# Patient Record
Sex: Female | Born: 1965 | Race: Black or African American | Hispanic: No | Marital: Single | Smoking: Never smoker
Health system: Southern US, Community
[De-identification: ages and names within clinical notes are randomized; demographics above are authoritative.]

## PROBLEM LIST (undated history)

## (undated) DIAGNOSIS — K56609 Unspecified intestinal obstruction, unspecified as to partial versus complete obstruction: Secondary | ICD-10-CM

## (undated) DIAGNOSIS — A419 Sepsis, unspecified organism: Secondary | ICD-10-CM

## (undated) DIAGNOSIS — I10 Essential (primary) hypertension: Secondary | ICD-10-CM

## (undated) DIAGNOSIS — K650 Generalized (acute) peritonitis: Secondary | ICD-10-CM

## (undated) DIAGNOSIS — Z8719 Personal history of other diseases of the digestive system: Secondary | ICD-10-CM

## (undated) DIAGNOSIS — R6521 Severe sepsis with septic shock: Secondary | ICD-10-CM

## (undated) DIAGNOSIS — N189 Chronic kidney disease, unspecified: Secondary | ICD-10-CM

## (undated) DIAGNOSIS — E119 Type 2 diabetes mellitus without complications: Secondary | ICD-10-CM

## (undated) DIAGNOSIS — R6520 Severe sepsis without septic shock: Secondary | ICD-10-CM

## (undated) DIAGNOSIS — J9601 Acute respiratory failure with hypoxia: Secondary | ICD-10-CM

## (undated) HISTORY — PX: ABDOMINAL HYSTERECTOMY: SHX81

## (undated) HISTORY — PX: APPENDECTOMY: SHX54

## (undated) HISTORY — DX: Personal history of other diseases of the digestive system: Z87.19

## (undated) HISTORY — DX: Severe sepsis without septic shock: R65.20

## (undated) HISTORY — DX: Chronic kidney disease, unspecified: N18.9

## (undated) HISTORY — DX: Severe sepsis with septic shock: R65.21

## (undated) HISTORY — DX: Unspecified intestinal obstruction, unspecified as to partial versus complete obstruction: K56.609

## (undated) HISTORY — DX: Sepsis, unspecified organism: A41.9

## (undated) HISTORY — PX: CHOLECYSTECTOMY: SHX55

## (undated) HISTORY — DX: Acute respiratory failure with hypoxia: J96.01

## (undated) HISTORY — DX: Generalized (acute) peritonitis: K65.0

---

## 1999-06-25 ENCOUNTER — Emergency Department (HOSPITAL_COMMUNITY): Admission: EM | Admit: 1999-06-25 | Discharge: 1999-06-25 | Payer: Self-pay | Admitting: *Deleted

## 2000-07-22 ENCOUNTER — Encounter: Admission: RE | Admit: 2000-07-22 | Discharge: 2000-10-20 | Payer: Self-pay | Admitting: Family Medicine

## 2001-01-07 ENCOUNTER — Encounter: Admission: RE | Admit: 2001-01-07 | Discharge: 2001-04-07 | Payer: Self-pay | Admitting: Family Medicine

## 2004-08-16 ENCOUNTER — Ambulatory Visit: Payer: Self-pay | Admitting: Family Medicine

## 2004-09-06 ENCOUNTER — Ambulatory Visit: Payer: Self-pay | Admitting: Family Medicine

## 2005-07-12 ENCOUNTER — Ambulatory Visit: Payer: Self-pay | Admitting: Family Medicine

## 2005-10-04 ENCOUNTER — Emergency Department (HOSPITAL_COMMUNITY): Admission: EM | Admit: 2005-10-04 | Discharge: 2005-10-04 | Payer: Self-pay | Admitting: Family Medicine

## 2005-10-08 ENCOUNTER — Ambulatory Visit: Payer: Self-pay | Admitting: Sports Medicine

## 2005-10-22 ENCOUNTER — Ambulatory Visit: Payer: Self-pay | Admitting: Sports Medicine

## 2006-05-13 ENCOUNTER — Ambulatory Visit (HOSPITAL_COMMUNITY): Admission: RE | Admit: 2006-05-13 | Discharge: 2006-05-13 | Payer: Self-pay | Admitting: Family Medicine

## 2006-05-13 ENCOUNTER — Ambulatory Visit: Payer: Self-pay | Admitting: Sports Medicine

## 2006-05-20 ENCOUNTER — Ambulatory Visit: Payer: Self-pay | Admitting: Family Medicine

## 2006-06-14 ENCOUNTER — Ambulatory Visit: Payer: Self-pay

## 2006-06-27 ENCOUNTER — Encounter: Admission: RE | Admit: 2006-06-27 | Discharge: 2006-06-27 | Payer: Self-pay | Admitting: Sports Medicine

## 2007-01-09 DIAGNOSIS — Z6841 Body Mass Index (BMI) 40.0 and over, adult: Secondary | ICD-10-CM

## 2007-01-09 DIAGNOSIS — I10 Essential (primary) hypertension: Secondary | ICD-10-CM

## 2007-01-09 DIAGNOSIS — J309 Allergic rhinitis, unspecified: Secondary | ICD-10-CM

## 2007-02-26 ENCOUNTER — Ambulatory Visit: Payer: Self-pay | Admitting: Family Medicine

## 2007-02-26 ENCOUNTER — Encounter: Payer: Self-pay | Admitting: Family Medicine

## 2007-02-26 LAB — CONVERTED CEMR LAB
BUN: 17 mg/dL (ref 6–23)
CO2: 24 meq/L (ref 19–32)
Calcium: 10.5 mg/dL (ref 8.4–10.5)
Chloride: 103 meq/L (ref 96–112)
Creatinine, Ser: 1.15 mg/dL (ref 0.40–1.20)
Glucose, Bld: 99 mg/dL (ref 70–99)
Potassium: 4 meq/L (ref 3.5–5.3)
Sodium: 139 meq/L (ref 135–145)

## 2007-07-15 ENCOUNTER — Telehealth: Payer: Self-pay | Admitting: *Deleted

## 2007-09-30 ENCOUNTER — Telehealth (INDEPENDENT_AMBULATORY_CARE_PROVIDER_SITE_OTHER): Payer: Self-pay | Admitting: Family Medicine

## 2007-10-03 ENCOUNTER — Ambulatory Visit: Payer: Self-pay | Admitting: Family Medicine

## 2007-10-03 DIAGNOSIS — L2089 Other atopic dermatitis: Secondary | ICD-10-CM

## 2007-10-13 ENCOUNTER — Telehealth: Payer: Self-pay | Admitting: *Deleted

## 2007-11-19 ENCOUNTER — Ambulatory Visit: Payer: Self-pay | Admitting: Family Medicine

## 2007-11-24 ENCOUNTER — Ambulatory Visit: Payer: Self-pay | Admitting: Sports Medicine

## 2007-11-24 ENCOUNTER — Encounter (INDEPENDENT_AMBULATORY_CARE_PROVIDER_SITE_OTHER): Payer: Self-pay | Admitting: Family Medicine

## 2007-11-24 LAB — CONVERTED CEMR LAB
Alkaline Phosphatase: 65 units/L (ref 39–117)
BUN: 18 mg/dL (ref 6–23)
CO2: 23 meq/L (ref 19–32)
Creatinine, Ser: 1.11 mg/dL (ref 0.40–1.20)
Glucose, Bld: 112 mg/dL — ABNORMAL HIGH (ref 70–99)
Sodium: 140 meq/L (ref 135–145)
Total Bilirubin: 0.2 mg/dL — ABNORMAL LOW (ref 0.3–1.2)
Total Protein: 7.6 g/dL (ref 6.0–8.3)

## 2007-12-22 ENCOUNTER — Ambulatory Visit: Payer: Self-pay | Admitting: Family Medicine

## 2008-02-06 ENCOUNTER — Encounter (INDEPENDENT_AMBULATORY_CARE_PROVIDER_SITE_OTHER): Payer: Self-pay | Admitting: Family Medicine

## 2008-02-06 ENCOUNTER — Ambulatory Visit: Payer: Self-pay | Admitting: Family Medicine

## 2008-02-06 LAB — CONVERTED CEMR LAB
Cholesterol: 199 mg/dL (ref 0–200)
HDL: 51 mg/dL (ref >39)
LDL Cholesterol: 130 mg/dL — ABNORMAL HIGH (ref 0–99)
Total CHOL/HDL Ratio: 3.9
Triglycerides: 90 mg/dL (ref <150)
VLDL: 18 mg/dL (ref 0–40)

## 2008-03-15 ENCOUNTER — Telehealth: Payer: Self-pay | Admitting: *Deleted

## 2008-07-30 ENCOUNTER — Emergency Department (HOSPITAL_COMMUNITY): Admission: EM | Admit: 2008-07-30 | Discharge: 2008-07-30 | Payer: Self-pay | Admitting: Family Medicine

## 2008-10-29 ENCOUNTER — Ambulatory Visit: Payer: Self-pay | Admitting: Family Medicine

## 2008-10-29 ENCOUNTER — Encounter (INDEPENDENT_AMBULATORY_CARE_PROVIDER_SITE_OTHER): Payer: Self-pay | Admitting: Family Medicine

## 2008-10-29 DIAGNOSIS — E1169 Type 2 diabetes mellitus with other specified complication: Secondary | ICD-10-CM

## 2008-10-29 DIAGNOSIS — E782 Mixed hyperlipidemia: Secondary | ICD-10-CM

## 2008-10-29 LAB — CONVERTED CEMR LAB
BUN: 19 mg/dL (ref 6–23)
Calcium: 9.9 mg/dL (ref 8.4–10.5)
Chloride: 106 meq/L (ref 96–112)
Creatinine, Ser: 0.99 mg/dL (ref 0.40–1.20)

## 2008-11-30 ENCOUNTER — Encounter (INDEPENDENT_AMBULATORY_CARE_PROVIDER_SITE_OTHER): Payer: Self-pay | Admitting: Family Medicine

## 2008-11-30 ENCOUNTER — Telehealth (INDEPENDENT_AMBULATORY_CARE_PROVIDER_SITE_OTHER): Payer: Self-pay | Admitting: Family Medicine

## 2009-01-17 ENCOUNTER — Telehealth: Payer: Self-pay | Admitting: *Deleted

## 2009-03-01 ENCOUNTER — Encounter (INDEPENDENT_AMBULATORY_CARE_PROVIDER_SITE_OTHER): Payer: Self-pay | Admitting: Family Medicine

## 2009-03-01 ENCOUNTER — Ambulatory Visit: Payer: Self-pay | Admitting: Family Medicine

## 2009-03-02 ENCOUNTER — Encounter (INDEPENDENT_AMBULATORY_CARE_PROVIDER_SITE_OTHER): Payer: Self-pay | Admitting: Family Medicine

## 2009-03-02 LAB — CONVERTED CEMR LAB
ALT: 13 units/L (ref 0–35)
AST: 15 units/L (ref 0–37)
Albumin: 4.2 g/dL (ref 3.5–5.2)
Alkaline Phosphatase: 65 units/L (ref 39–117)
BUN: 13 mg/dL (ref 6–23)
Calcium: 10.5 mg/dL (ref 8.4–10.5)
Chloride: 102 meq/L (ref 96–112)
HDL: 56 mg/dL (ref >39)
LDL Cholesterol: 131 mg/dL — ABNORMAL HIGH (ref 0–99)
Potassium: 3.9 meq/L (ref 3.5–5.3)
Total CHOL/HDL Ratio: 3.7

## 2009-10-10 ENCOUNTER — Emergency Department (HOSPITAL_COMMUNITY): Admission: EM | Admit: 2009-10-10 | Discharge: 2009-10-10 | Payer: Self-pay | Admitting: Family Medicine

## 2009-11-10 ENCOUNTER — Encounter: Payer: Self-pay | Admitting: Family Medicine

## 2009-11-10 ENCOUNTER — Ambulatory Visit: Payer: Self-pay | Admitting: Family Medicine

## 2009-11-25 ENCOUNTER — Telehealth: Payer: Self-pay | Admitting: Family Medicine

## 2010-03-24 ENCOUNTER — Ambulatory Visit: Payer: Self-pay | Admitting: Family Medicine

## 2010-03-24 ENCOUNTER — Encounter: Payer: Self-pay | Admitting: Family Medicine

## 2010-03-24 LAB — CONVERTED CEMR LAB
ALT: 10 units/L (ref 0–35)
Albumin: 4.4 g/dL (ref 3.5–5.2)
Alkaline Phosphatase: 65 units/L (ref 39–117)
CO2: 23 meq/L (ref 19–32)
LDL Cholesterol: 134 mg/dL — ABNORMAL HIGH (ref 0–99)
Potassium: 4.1 meq/L (ref 3.5–5.3)
Sodium: 139 meq/L (ref 135–145)
Total Bilirubin: 0.3 mg/dL (ref 0.3–1.2)
Total Protein: 7.9 g/dL (ref 6.0–8.3)
VLDL: 15 mg/dL (ref 0–40)

## 2010-04-06 ENCOUNTER — Ambulatory Visit: Payer: Self-pay | Admitting: Family Medicine

## 2010-04-06 DIAGNOSIS — E1165 Type 2 diabetes mellitus with hyperglycemia: Secondary | ICD-10-CM | POA: Insufficient documentation

## 2010-04-11 ENCOUNTER — Ambulatory Visit: Payer: Self-pay | Admitting: Family Medicine

## 2010-04-14 ENCOUNTER — Ambulatory Visit: Payer: Self-pay | Admitting: Family Medicine

## 2010-04-17 ENCOUNTER — Telehealth (INDEPENDENT_AMBULATORY_CARE_PROVIDER_SITE_OTHER): Payer: Self-pay | Admitting: *Deleted

## 2010-08-29 ENCOUNTER — Telehealth (INDEPENDENT_AMBULATORY_CARE_PROVIDER_SITE_OTHER): Payer: Self-pay | Admitting: *Deleted

## 2010-09-11 ENCOUNTER — Telehealth: Payer: Self-pay | Admitting: Family Medicine

## 2010-09-26 ENCOUNTER — Ambulatory Visit: Payer: Self-pay | Admitting: Family Medicine

## 2010-09-26 LAB — CONVERTED CEMR LAB: Hgb A1c MFr Bld: 7.7 %

## 2010-12-14 NOTE — Progress Notes (Signed)
Summary: x-ray change  Phone Note Call from Patient Call back at Home Phone [phone]   Caller: Patient Summary of Call: Pt's schedule has changed and wondering if she can go for x-ray today. Initial call taken by: Clydell Hakim,  April 17, 2010 9:24 AM  Follow-up for Phone Call        patient was wanting to [facility] instead of tomorrow. gave her the number to call to try to reschedule.  Follow-up by: Theresia Lo RN,  April 17, 2010 9:41 AM

## 2010-12-14 NOTE — Progress Notes (Signed)
Summary: Rx Req  Phone Note Refill Request Call back at Home Phone [phone] Message from:  Patient  Refills Requested: Medication #1:  MAXZIDE-25 37.5-25 MG TABS Take 1 tablet by mouth once a day [facility] [address]  Initial call taken by: Clydell Hakim,  August 29, 2010 12:20 PM  Follow-up for Phone Call        advised patient that Rx was sent 08/25/2010. she states pharmacy does not have. will call pharmacy . advised patient that she will need to schedule appointment with new PCP soon. she is unable to schedule now and will call back.  called pharmacy and they did not receive RX, verbally gave refill to pharmacist that was sent 08/25/2010 Follow-up by: Theresia Lo RN,  August 29, 2010 3:17 PM

## 2010-12-14 NOTE — Progress Notes (Signed)
Summary: refill  Phone Note Refill Request Call back at Home Phone [phone] Message from:  Patient  Refills Requested: Medication #1:  MAXZIDE-25 37.5-25 MG TABS Take 1 tablet by mouth once a day  Medication #2:  LOTREL 5-40 MG  CAPS 1 tablet by mouth daily   Notes: will run out by end of month Initial call taken by: De Nurse,  November 25, 2009 8:54 AM  Follow-up for Phone Call        to pcp Follow-up by: Golden Circle RN,  November 25, 2009 8:58 AM  Additional Follow-up for Phone Call Additional follow up Details #1::        sent Additional Follow-up by: Lequita Asal  MD,  November 25, 2009 10:30 AM    Additional Follow-up for Phone Call Additional follow up Details #2::    pt notified Follow-up by: Golden Circle RN,  November 25, 2009 10:40 AM  Prescriptions: MAXZIDE-25 37.5-25 MG TABS (TRIAMTERENE-HCTZ) Take 1 tablet by mouth once a day  #30 x 6   Entered and Authorized by:   Lequita Asal  MD   Signed by:   Lequita Asal  MD on 11/25/2009   Method used:   Electronically to        [facility] [address] [phone]* (retail)       [address]       [place], [place]  [address]       Ph: [phone] or [phone]       Fax: [phone]   RxID:   [id number] LOTREL 5-40 MG  CAPS (AMLODIPINE BESY-BENAZEPRIL HCL) 1 tablet by mouth daily  #30 Capsule x 6   Entered and Authorized by:   Lequita Asal  MD   Signed by:   Lequita Asal  MD on 11/25/2009   Method used:   Electronically to        [facility]  [address] [phone]* (retail)       [address]       [place], [place]  [id number]       Ph: [phone] or [phone]       Fax: [phone]   RxID:   [id number]

## 2010-12-14 NOTE — Progress Notes (Signed)
Summary: question  Hey, I would be happy to refill these meds, but when I try to refill them, there is a note that says pt needs an office visit before refilling the Amlodipine and Maxzide.  Can she come in before 11/15 as a work in?  Phone Note Refill Request   Refills Requested: Medication #1:  MAXZIDE-25 37.5-25 MG TABS Take 1 tablet by mouth once a day Erin Schultz need refills on her Amlodipine and Maxzide .  She has an appt on 11/15.  Pt completely out of meds.  Initial call taken by: Abundio Miu,  September 11, 2010 9:14 AM     Appended Document: question spoke with pt and she informed me that she has made an appt to be seen however, she wanted to know if she can have a few pills until her appt?

## 2010-12-14 NOTE — Assessment & Plan Note (Signed)
Summary: f/u bp meds/bmc   Vital Signs:  Patient profile:   45 year old female Height:      66 inches Weight:      352 pounds BMI:     57.02 BSA:     2.55 Temp:     98.2 degrees F Pulse rate:   78 / minute BP sitting:   127 / 85  Vitals Entered By: Jone Baseman CMA (September 26, 2010 3:32 PM) CC: f/u BP meds Is Patient Diabetic? No Pain Assessment Patient in pain? no        Primary Provider:  Barnabas Lister  MD  CC:  f/u BP meds.  History of Present Illness: 45 yo AAF presents to clinic to meet new MD and refill BP meds.  BP 127/85 today.  Taking Triam/HCTZ and Amlodipione/Benzapril.  Pt has no adverse effects from meds.  Urinates about 3-4x day, but does not wake up at night to urinate.  States she is working hard on eating healthy, low-sodium foods and walking 15 min/day.    Pt was dx with T2DM and given Rx for Metormin.  Pt does not believe she is diabetic, and b/c her last A1C was 7.4, she wanted to modify her diet before taking medication.  Still seems hesitant to take DM meds.   ROS: Denies CP, SOB, HA, N/V.  Denies blurry vision, numbness/tingling in extremities.  Preventive Screening-Counseling & Management  Alcohol-Tobacco     Smoking Status: never     Tobacco Counseling: not indicated; no tobacco use  Current Medications (verified): 1)  Lotrel 5-40 Mg  Caps (Amlodipine Besy-Benazepril Hcl) .Marland Kitchen.. 1 Tablet By Mouth Daily 2)  Maxzide-25 37.5-25 Mg Tabs (Triamterene-Hctz) .... Take 1 Tablet By Mouth Once A Day 3)  Allegra 180 Mg Tabs (Fexofenadine Hcl) .... Take 1 Tablet By Mouth Once A Day 4)  Flonase 50 Mcg/act Susp (Fluticasone Propionate) .... Spray 2 Spray Into Both Nostrils Once A Day 5)  Zyrtec Allergy 10 Mg Tabs (Cetirizine Hcl) .Marland Kitchen.. 1 Tablet By Mouth At Bedtime For Allergies - Hold Until Patient Desires 6)  Glucophage 500 Mg Tabs (Metformin Hcl) .... One Tablet By Mouth At Bedtime X1 Week, Then Increase To One Tablet By Mouth Two Times A Day For  Diabetes  Allergies (verified): 1)  ! Sulfa  Family History: Reviewed history from 04/06/2010 and no changes required. Father diabetes and hypertension., Mother border line diabetes. brother diabetes  Social History: Reviewed history from 11/10/2009 and no changes required. Single, lives alone, close contact with family. 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 Denies tobacco, alcohol or other drugs.  Review of Systems       per HPI  Physical Exam  General:  alert and cooperative to examination.   Eyes:  vision grossly intact, pupils equal, pupils round, pupils reactive to light, and pupils react to accomodation.   Lungs:  Normal respiratory effort, chest expands symmetrically. Lungs are clear to auscultation, no crackles or wheezes. Heart:  Normal rate and regular rhythm. S1 and S2 normal without gallop, murmur, click, rub or other extra sounds. Extremities:  No clubbing, cyanosis, edema, or deformity noted with normal full range of motion of all joints.   Neurologic:  No cranial nerve deficits noted. Station and gait are normal.  Sensory, motor and coordinative functions appear intact.   Impression & Recommendations:  Problem # 1:  HYPERTENSION, BENIGN SYSTEMIC (ICD-401.1) Assessment Unchanged BP well-controlled today.  At last visit, BP was 115/82.  Will continue BP meds.  Will continue to monitor.  Her updated medication list for this problem includes:    Lotrel 5-40 Mg Caps (Amlodipine besy-benazepril hcl) .Marland Kitchen... 1 tablet by mouth daily    Maxzide-25 37.5-25 Mg Tabs (Triamterene-hctz) .Marland Kitchen... Take 1 tablet by mouth once a day  Orders: [facility]- Est Level  3 (16109)  Problem # 2:  DIABETES MELLITUS, TYPE II (ICD-250.00) Assessment: Deteriorated A1C today was 7.7.  Discussed pt's reluctance to start Metformin.  Advised pt that Metformin + diet/exercise could result in 6% weight reduction.  Will refill Metformin if pt decides to start taking it.  At next  visit, will consider referral to Dr. Gerilyn Pilgrim for weight management and nutrition counseling.  Would like to see pt in 2-3 months for f/u DM.  Her updated medication list for this problem includes:    Lotrel 5-40 Mg Caps (Amlodipine besy-benazepril hcl) .Marland Kitchen... 1 tablet by mouth daily    Glucophage 500 Mg Tabs (Metformin hcl) ..... One tablet by mouth at bedtime x1 week, then increase to one tablet by mouth two times a day for diabetes  Orders: A1C-[facility] (60454) [facility]- Est Level  3 (09811)  Patient Instructions: 1)  It was great to meet you today. 2)  I will [facility] your ref[facility], please continue to take medications as directed. 3)  Continue your healthy eating habits and walking exercises.  Keep up the good work! 4)  Thanks for your time. Prescriptions: GLUCOPHAGE 500 MG TABS (METFORMIN HCL) one tablet by mouth at bedtime x1 week, then increase to one tablet by mouth two times a day for DIABETES  #60 x 4   Entered and Authorized by:   Ivy de Lawson Radar  MD   Signed by:   Barnabas Lister  MD on 09/26/2010   Method used:   Electronically to        [facility]  [address] [phone]* (retail)       [address]       [place], [place]  [address]       Ph: [phone] or [phone]       Fax: [phone]   RxID:   [id number] LOTREL 5-40 MG  CAPS (AMLODIPINE BESY-BENAZEPRIL HCL) 1 tablet by mouth daily  #30 x 5   Entered and Authorized by:   Ivy de Lawson Radar  MD   Signed by:   Barnabas Lister  MD on 09/26/2010   Method used:   Electronically to        [facility] [phone]* (retail)       [address]       [place], [place]  [id number]       Ph: [phone] or [phone]       Fax: [phone]   RxID:   [id number] MAXZIDE-25 37.5-25 MG TABS (TRIAMTERENE-HCTZ) Take 1 tablet by mouth once a day  #30 x 5   Entered and Authorized by:   Ivy de Lawson Radar  MD   Signed by:   Barnabas Lister  MD on 09/26/2010   Method used:    Electronically to        [facility]  [address] [phone]* (retail)       [address]       [place], [place]  [address]       Ph: [phone] or [phone]       Fax: [phone]   RxID:   [id number]    Orders Added: 1)  A1C-[facility] [83036] 2)  [facility]- Est Level  3 [36644]    Laboratory Results   Blood Tests   Date/Time Received: September 26, 2010 3:53 PM  Date/Time Reported: September 26, 2010 4:05 PM  HGBA1C: 7.7%   (Normal Range: Non-Diabetic - 3-6%   Control Diabetic - 6-8%)  Comments: ...............test performed by......Marland KitchenBonnie A. [place], MLS (ASCP)cm

## 2010-12-14 NOTE — Assessment & Plan Note (Signed)
Summary: tb test/eo  Nurse Visit   Allergies: 1)  ! Sulfa  Immunizations Administered:  PPD Skin Test:    Vaccine Type: PPD    Site: left forearm    Mfr: Sanofi Pasteur    Dose: 0.1 ml    Route: ID    Given by: Dennison Nancy RN    Exp. Date: 08/25/2011    Lot #: [id number]  Orders Added: 1)  TB Skin Test [86580] 2)  Admin 1st Vaccine [90471] 3)  Est Level 1- [facility] [16109]

## 2010-12-14 NOTE — Assessment & Plan Note (Signed)
Summary: SUMMARY   Vital Signs:  Patient profile:   45 year old female Weight:      350.4 pounds BMI:     56.76 Temp:     98 degrees F oral Pulse rate:   147 / minute Pulse rhythm:   regular BP sitting:   115 / 82  (left arm) Cuff size:   large  Vitals Entered By: Loralee Pacas CMA (Apr 06, 2010 9:56 AM)  Nutrition Counseling: Patient's BMI is greater than 25 and therefore counseled on weight management options.  Serial Vital Signs/Assessments:  Time      Position  BP       Pulse  Resp  Temp     By 3:27 PM                      95                    Lequita Asal  MD  CC: follow-up visit Is Patient Diabetic? No   Primary Care Provider:  Lequita Asal  MD  CC:  follow-up visit.  History of Present Illness:   1.  HTN - Tolerating Medications without problems.  on lotrel and maxide. Denies CP, SOB, edema, HA, light-headeness.     obesity- has increased walking to several days weekly. states she has modified diet as well. up 5 lbs since last visit.   Habits & Providers  Alcohol-Tobacco-Diet     Tobacco Status: never  Current Medications (verified): 1)  Lotrel 5-40 Mg  Caps (Amlodipine Besy-Benazepril Hcl) .Marland Kitchen.. 1 Tablet By Mouth Daily 2)  Maxzide-25 37.5-25 Mg Tabs (Triamterene-Hctz) .... Take 1 Tablet By Mouth Once A Day 3)  Allegra 180 Mg Tabs (Fexofenadine Hcl) .... Take 1 Tablet By Mouth Once A Day 4)  Flonase 50 Mcg/act Susp (Fluticasone Propionate) .... Spray 2 Spray Into Both Nostrils Once A Day 5)  Zyrtec Allergy 10 Mg Tabs (Cetirizine Hcl) .Marland Kitchen.. 1 Tablet By Mouth At Bedtime For Allergies - Hold Until Patient Desires  Allergies (verified): 1)  ! Sulfa  Family History: Father diabetes and hypertension., Mother border line diabetes. brother diabetes  Physical Exam  General:  Morbidly obese female.Pleasant. appropriate dress and cooperative to examination.  vitals reviewed Eyes:  vision grossly normal Ears:  hearing grossly normal Mouth:  MMM Neck:   No deformities, masses, or tenderness noted. Breasts:  deferred Lungs:  Normal respiratory effort, chest expands symmetrically. Lungs are clear to auscultation, no crackles or wheezes. Heart:  Normal rate and regular rhythm. S1 and S2 normal without gallop, murmur, click, rub or other extra sounds. Abdomen:  obese, NT, ND. +BS Genitalia:  deferred Extremities:  1+ pitting edema of bilateral lower extremities to shins Neurologic:  alert & oriented X3 and gait normal.   Psych:  Oriented X3.    >20 minutes was spent with this patient with >50% spent in counseling.   Impression & Recommendations:  Problem # 1:  DIABETES MELLITUS, TYPE II (ICD-250.00) Assessment New  recent fasting labs with glucose of 128. A1C today 7.4. long discussion about new diagnosis. patient given information handouts and book. not interested in blood glucose monitoring at this time. also not interested in starting statin therapy. is willing to start glucophage. f/u in 3 months.   Her updated medication list for this problem includes:    Lotrel 5-40 Mg Caps (Amlodipine besy-benazepril hcl) .Marland Kitchen... 1 tablet by mouth daily    Glucophage 500 Mg Tabs (Metformin  hcl) ..... One tablet by mouth at bedtime x1 week, then increase to one tablet by mouth two times a day for diabetes  Orders: [facility]- Est  Level 4 (29528)  Problem # 2:  HYPERLIPIDEMIA (ICD-272.4) Assessment: Unchanged  LDL >130. patient reluctant to start statin therapy today. would like to continue to attempt lifestyle modifications. would readdress at next visit.   Orders: [facility]- Est  Level 4 (41324)  Problem # 3:  HYPERTENSION, BENIGN SYSTEMIC (ICD-401.1) Assessment: Unchanged  no changes.   Her updated medication list for this problem includes:    Lotrel 5-40 Mg Caps (Amlodipine besy-benazepril hcl) .Marland Kitchen... 1 tablet by mouth daily    Maxzide-25 37.5-25 Mg Tabs (Triamterene-hctz) .Marland Kitchen... Take 1 tablet by mouth once a day  Orders: [facility]- Est  Level 4  (40102)  Problem # 4:  OBESITY, NOS (ICD-278.00) Assessment: Deteriorated  patient encouraged to continue to increase exercise to most days weekly for at least 30 minutes and watch intake of fried/fatty foods. informed that we have nutritionist available. right now, patient just appears overwhelmed at DM diagnosis.   Orders: [facility]- Est  Level 4 (72536)  Other Orders: A1C-[facility] (64403)  Patient Instructions: 1)  Follow up at [facility] in 3 months to discuss diabetes and cholesterol.  2)  It is important that you exercise reguarly at least 20 minutes 5 times a week. If you develop chest pain, have severe difficulty breathing, or feel very tired, stop exercising immediately and seek medical attention.  3)  You need to lose weight. Consider a lower calorie diet and regular exercise.  Prescriptions: GLUCOPHAGE 500 MG TABS (METFORMIN HCL) one tablet by mouth at bedtime x1 week, then increase to one tablet by mouth two times a day for DIABETES  #60 x 4   Entered and Authorized by:   Lequita Asal  MD   Signed by:   Lequita Asal  MD on 04/06/2010   Method used:   Electronically to        [facility] [facility] [address] [phone]* (retail)       [address]       [place], [place]  [address]       Ph: [phone] or [phone]       Fax: [phone]   RxID:   [id number]   Laboratory Results   Blood Tests   Date/Time Received: Apr 06, 2010 10:27 AM  Date/Time Reported: Apr 06, 2010 10:47 AM   HGBA1C: 7.4%   (Normal Range: Non-Diabetic - 3-6%   Control Diabetic - 6-8%)  Comments: ...............test performed by......Marland KitchenBonnie A. [place], MLS (ASCP)cm      Prevention & Chronic Care Immunizations   Influenza vaccine: Not documented   Influenza vaccine deferral: Not available  (04/06/2010)    Tetanus booster: 10/29/2008: Tdap    Pneumococcal vaccine: Not documented  Other Screening   Pap smear: Not documented   Pap smear due: Not  Indicated    Mammogram: Done.  (06/12/2006)   Mammogram due: 06/2007   Smoking status: never  (04/06/2010)  Diabetes Mellitus   HgbA1C: 7.4  (04/06/2010)    Eye exam: Not documented    Foot exam: Not documented   High risk foot: Not documented   Foot care education: Not documented    Urine microalbumin/creatinine ratio: Not documented    Diabetes flowsheet reviewed?: Yes   Progress toward A1C goal: Unchanged  Lipids   Total Cholesterol: 203  (03/24/2010)   LDL:  134  (03/24/2010)   LDL Direct: Not documented   HDL: 54  (03/24/2010)   Triglycerides: 77  (03/24/2010)    SGOT (AST): 14  (03/24/2010)   SGPT (ALT): 10  (03/24/2010)   Alkaline phosphatase: 65  (03/24/2010)   Total bilirubin: 0.3  (03/24/2010)    Lipid flowsheet reviewed?: Yes   Progress toward LDL goal: Unchanged  Hypertension   Last Blood Pressure: 115 / 82  (04/06/2010)   Serum creatinine: 1.08  (03/24/2010)   Serum potassium 4.1  (03/24/2010)    Hypertension flowsheet reviewed?: Yes   Progress toward BP goal: At goal  Self-Management Support :   Personal Goals (by the next clinic visit) :     Personal A1C goal: 7  (04/06/2010)     Personal blood pressure goal: 130/80  (04/06/2010)     Personal LDL goal: 100  (04/06/2010)    Patient will work on the following items until the next clinic visit to reach self-care goals:     Medications and monitoring: take my medicines every day, examine my feet every day  (04/06/2010)     Eating: eat foods that are low in salt, eat baked foods instead of fried foods  (04/06/2010)     Activity: take a 30 minute walk every day  (04/06/2010)    Diabetes self-management support: Written self-care plan, Education handout  (04/06/2010)   Diabetes care plan printed   Diabetes education handout printed    Hypertension self-management support: Written self-care plan  (04/06/2010)   Hypertension self-care plan printed.    Hypertension self-management support not done  because: Good outcomes  (04/06/2010)    Lipid self-management support: Written self-care plan  (04/06/2010)   Lipid self-care plan printed.   Prevention & Chronic Care Immunizations   Influenza vaccine: Not documented   Influenza vaccine deferral: Not available  (04/06/2010)    Tetanus booster: 10/29/2008: Tdap    Pneumococcal vaccine: Not documented  Other Screening   Pap smear: Not documented   Pap smear due: Not Indicated    Mammogram: Done.  (06/12/2006)   Mammogram due: 06/2007   Smoking status: never  (04/06/2010)  Diabetes Mellitus   HgbA1C: 7.4  (04/06/2010)    Eye exam: Not documented    Foot exam: Not documented   High risk foot: Not documented   Foot care education: Not documented    Urine microalbumin/creatinine ratio: Not documented    Diabetes flowsheet reviewed?: Yes   Progress toward A1C goal: Unchanged  Lipids   Total Cholesterol: 203  (03/24/2010)   LDL: 134  (03/24/2010)   LDL Direct: Not documented   HDL: 54  (03/24/2010)   Triglycerides: 77  (03/24/2010)    SGOT (AST): 14  (03/24/2010)   SGPT (ALT): 10  (03/24/2010)   Alkaline phosphatase: 65  (03/24/2010)   Total bilirubin: 0.3  (03/24/2010)    Lipid flowsheet reviewed?: Yes   Progress toward LDL goal: Unchanged  Hypertension   Last Blood Pressure: 115 / 82  (04/06/2010)   Serum creatinine: 1.08  (03/24/2010)   Serum potassium 4.1  (03/24/2010)    Hypertension flowsheet reviewed?: Yes   Progress toward BP goal: At goal  Self-Management Support :   Personal Goals (by the next clinic visit) :     Personal A1C goal: 7  (04/06/2010)     Personal blood pressure goal: 130/80  (04/06/2010)     Personal LDL goal: 100  (04/06/2010)    Patient will work on the following items until the  next clinic visit to reach self-care goals:     Medications and monitoring: take my medicines every day, examine my feet every day  (04/06/2010)     Eating: eat foods that are low in salt, eat baked  foods instead of fried foods  (04/06/2010)     Activity: take a 30 minute walk every day  (04/06/2010)    Diabetes self-management support: Written self-care plan, Education handout  (04/06/2010)   Diabetes care plan printed   Diabetes education handout printed    Hypertension self-management support: Written self-care plan  (04/06/2010)   Hypertension self-care plan printed.    Hypertension self-management support not done because: Good outcomes  (04/06/2010)    Lipid self-management support: Written self-care plan  (04/06/2010)   Lipid self-care plan printed.

## 2010-12-14 NOTE — Assessment & Plan Note (Signed)
Summary: read tb/eo  Nurse Visit   Allergies: 1)  ! Sulfa  PPD Results    Date of reading: 04/14/2010    Results: 12 cm X 15 cm induration    Interpretation: positive  Orders Added: 1)  No Charge Patient Arrived (NCPA0) [NCPA0]   Dr. Sheffield Slider verified results . Appointment scheduled at [facility] 04/18/2010 at 2:30 PM. Theresia Lo RN  April 14, 2010 9:27 AM  Appended Document: read tb/eo Patient states this is the first PPD she can remember ever having. Denies night sweats, cough, weight loss. generally feels well.

## 2011-03-05 ENCOUNTER — Telehealth: Payer: Self-pay | Admitting: *Deleted

## 2011-03-05 MED ORDER — METFORMIN HCL 500 MG PO TABS: ORAL_TABLET | ORAL | Status: DC

## 2011-03-05 NOTE — Telephone Encounter (Signed)
Received refill request for metformin. Called and left message for patient to call. She needs office visit. RX refilled for one month.

## 2011-03-05 NOTE — Telephone Encounter (Signed)
Pt calling back, has appt on Thu May 3 with Tye Savoy, wants [place] to call in enough to get her thru to her appt. Pt goes to [address]

## 2011-03-09 ENCOUNTER — Other Ambulatory Visit: Payer: Self-pay | Admitting: Family Medicine

## 2011-03-09 MED ORDER — METFORMIN HCL 500 MG PO TABS: ORAL_TABLET | ORAL | Status: DC

## 2011-03-15 ENCOUNTER — Ambulatory Visit: Payer: Self-pay | Admitting: Family Medicine

## 2011-03-22 ENCOUNTER — Ambulatory Visit (INDEPENDENT_AMBULATORY_CARE_PROVIDER_SITE_OTHER): Payer: BC Managed Care – PPO | Admitting: Family Medicine

## 2011-03-22 ENCOUNTER — Encounter: Payer: Self-pay | Admitting: Family Medicine

## 2011-03-22 ENCOUNTER — Ambulatory Visit: Payer: Self-pay | Admitting: Family Medicine

## 2011-03-22 DIAGNOSIS — I10 Essential (primary) hypertension: Secondary | ICD-10-CM

## 2011-03-22 DIAGNOSIS — E119 Type 2 diabetes mellitus without complications: Secondary | ICD-10-CM

## 2011-03-22 LAB — POCT GLYCOSYLATED HEMOGLOBIN (HGB A1C): Hemoglobin A1C: 9

## 2011-03-22 MED ORDER — AMLODIPINE BESY-BENAZEPRIL HCL 5-40 MG PO CAPS: 1.0000 | ORAL_CAPSULE | Freq: Every day | ORAL | Status: DC

## 2011-03-22 MED ORDER — TRIAMTERENE-HCTZ 37.5-25 MG PO TABS: 1.0000 | ORAL_TABLET | Freq: Every day | ORAL | Status: DC

## 2011-03-22 MED ORDER — METFORMIN HCL 500 MG PO TABS: ORAL_TABLET | ORAL | Status: DC

## 2011-03-22 MED ORDER — FLUTICASONE PROPIONATE 50 MCG/ACT NA SUSP: 2.0000 | Freq: Every day | NASAL | Status: DC

## 2011-03-22 MED ORDER — CETIRIZINE HCL 10 MG PO TABS: 10.0000 mg | ORAL_TABLET | Freq: Every day | ORAL | Status: DC

## 2011-03-22 NOTE — Patient Instructions (Signed)
It was great to meet you. Please take medications as directed. Please schedule a follow up appointment with me in 3 months. I will call you or send a letter with results of labs. Thank you.

## 2011-03-25 NOTE — Progress Notes (Signed)
  Subjective:    Patient ID: Erin Schultz, female    DOB: November 22, 1965, 45 y.o.   MRN: [id number]  HPI This is a 45 year old female who presents to clinic for  BP med refills.  Patient doing well.  Has started taking Metformin 4 weeks ago, but does not take it daily.  Patient has been reluctant to take DM medications for years, but because she continues to eat desserts and sweets, patient decided to start taking meds.  Has had some diarrhea with Metformin.  States that she takes BP meds compliantly.  She does walk 3 days/week and has lost ~6 lbs since November.    Denies HA, blurry vision, fatigue, numbness/tingling in extremities.  Denies any polyuria, polydipsia, chills, excessive sweating.  Endorses diarrhea.    Review of Systems Per HPI    Objective:   Physical Exam  Constitutional: She appears well-developed and well-nourished.       obese  Eyes: EOM are normal. Pupils are equal, round, and reactive to light.  Cardiovascular: Normal rate and regular rhythm.  Exam reveals no gallop and no friction rub.   No murmur heard. Pulmonary/Chest: Breath sounds normal. She has no wheezes. She has no rales. She exhibits no tenderness.  Abdominal: Soft. Bowel sounds are normal. She exhibits no distension. There is no tenderness.  Neurological: No cranial nerve deficit.          Assessment & Plan:

## 2011-03-25 NOTE — Assessment & Plan Note (Signed)
A1C today was 9.0 compared to last A1C 7.7 in November.  Patient was reluctant to take Metformin at her previous visit.  Wanted to modify lifestyle and avoid medications. Patient has started taking Metformin 4 weeks ago, but is non-compliant.  Says she misses her dose every other day.  Advised pt that Metformin + diet/exercise could result in 6% weight reduction.  Will continue Metformin 500 mg po BID.  Will likely need to increase dose at next visit.  Will repeat A1C at follow up visit in 3 months.  Patient understood and agreed with plan.

## 2011-03-25 NOTE — Assessment & Plan Note (Signed)
BP elevated today.  Patient states she takes BP meds daily, but admits to eating unhealthy foods.  Will continue current regimen.  Will consider referral to Dr. Gerilyn Pilgrim for diet and lifestyle modification.  Will repeat BP at follow up visit in 3 months.

## 2011-03-27 ENCOUNTER — Encounter: Payer: Self-pay | Admitting: Family Medicine

## 2011-04-10 ENCOUNTER — Emergency Department (HOSPITAL_COMMUNITY): Payer: BC Managed Care – PPO

## 2011-04-10 ENCOUNTER — Emergency Department (HOSPITAL_COMMUNITY)
Admission: EM | Admit: 2011-04-10 | Discharge: 2011-04-11 | Disposition: A | Discharge status: Home / Self Care | Payer: BC Managed Care – PPO | Attending: Emergency Medicine | Admitting: Emergency Medicine

## 2011-04-10 DIAGNOSIS — I10 Essential (primary) hypertension: Secondary | ICD-10-CM | POA: Insufficient documentation

## 2011-04-10 DIAGNOSIS — K2990 Gastroduodenitis, unspecified, without bleeding: Secondary | ICD-10-CM | POA: Insufficient documentation

## 2011-04-10 DIAGNOSIS — R112 Nausea with vomiting, unspecified: Secondary | ICD-10-CM | POA: Insufficient documentation

## 2011-04-10 DIAGNOSIS — K2970 Gastritis, unspecified, without bleeding: Secondary | ICD-10-CM | POA: Insufficient documentation

## 2011-04-10 DIAGNOSIS — R1013 Epigastric pain: Secondary | ICD-10-CM | POA: Insufficient documentation

## 2011-04-10 LAB — COMPREHENSIVE METABOLIC PANEL
ALT: 13 U/L (ref 0–35)
Alkaline Phosphatase: 74 U/L (ref 39–117)
CO2: 26 mEq/L (ref 19–32)
Calcium: 10.2 mg/dL (ref 8.4–10.5)
GFR calc non Af Amer: >60 mL/min (ref >60)
Glucose, Bld: 220 mg/dL — ABNORMAL HIGH (ref 70–99)
Sodium: 134 mEq/L — ABNORMAL LOW (ref 135–145)

## 2011-04-10 LAB — DIFFERENTIAL
Basophils Absolute: 0 10*3/uL (ref 0.0–0.1)
Lymphocytes Relative: 10 % — ABNORMAL LOW (ref 12–46)
Lymphs Abs: 1.9 10*3/uL (ref 0.7–4.0)
Monocytes Absolute: 0.7 10*3/uL (ref 0.1–1.0)
Monocytes Relative: 4 % (ref 3–12)
Neutro Abs: 15.9 10*3/uL — ABNORMAL HIGH (ref 1.7–7.7)

## 2011-04-10 LAB — CBC
HCT: 39.1 % (ref 36.0–46.0)
Hemoglobin: 13.3 g/dL (ref 12.0–15.0)
MCHC: 34 g/dL (ref 30.0–36.0)
MCV: 82.5 fL (ref 78.0–100.0)

## 2011-04-10 LAB — URINALYSIS, ROUTINE W REFLEX MICROSCOPIC
Bilirubin Urine: NEGATIVE
Ketones, ur: NEGATIVE mg/dL
Nitrite: NEGATIVE
Protein, ur: 30 mg/dL — AB
Urobilinogen, UA: 1 mg/dL (ref 0.0–1.0)

## 2011-04-10 LAB — URINE MICROSCOPIC-ADD ON

## 2011-05-07 ENCOUNTER — Telehealth: Payer: Self-pay | Admitting: Family Medicine

## 2011-05-07 NOTE — Telephone Encounter (Signed)
Trying to get a Freestyle meter and DM supplies and they told her that she would need to get a script from her doctor [facility] [address]

## 2011-05-07 NOTE — Telephone Encounter (Signed)
Hi team, this patient is on Metformin.  She does not need a meter and her insurance will not cover Freestyle meters.  Can you call patient and ask her to schedule an appointment with me next week Monday.  I have never discussed meters with her before, so she probably needs to come see me.  Thank you.

## 2011-05-07 NOTE — Telephone Encounter (Signed)
Spoke with pt and informed her of what pcp suggested. She stated that she thought that it would be a good idea to monitor her blood sugars.  Pt has appt on 7.2.2012 with pcp I told her that she can discuss that with her at that time should she have any further questions or concerns.Laureen Ochs, Viann Shove

## 2011-05-14 ENCOUNTER — Ambulatory Visit (INDEPENDENT_AMBULATORY_CARE_PROVIDER_SITE_OTHER): Payer: BC Managed Care – PPO | Admitting: Family Medicine

## 2011-05-14 ENCOUNTER — Encounter: Payer: Self-pay | Admitting: Family Medicine

## 2011-05-14 VITALS — BP 122/84 | HR 88 | Temp 97.8°F | Ht 67.0 in | Wt 343.0 lb

## 2011-05-14 DIAGNOSIS — I10 Essential (primary) hypertension: Secondary | ICD-10-CM

## 2011-05-14 DIAGNOSIS — E119 Type 2 diabetes mellitus without complications: Secondary | ICD-10-CM

## 2011-05-14 LAB — GLUCOSE, CAPILLARY: Glucose-Capillary: 134 mg/dL — ABNORMAL HIGH (ref 70–99)

## 2011-05-14 NOTE — Progress Notes (Signed)
  Subjective:    Patient ID: Erin Schultz, female    DOB: February 27, 1966, 45 y.o.   MRN: [id number]  HPI Patient here to discuss diagnosis of DM Type 2.  She was reluctant to accept the diagnosis in the past, but now she accepts it and wants to take better control.  Patient is interested in free glucose meters and wants a prescription.  Current regimen is Metformin 500 BID.  She states that she has been reading about diabetes, trying to eat healthy foods, and walking her friend's dog daily.  Side effects: diarrhea, otherwise, tolerating medication well.  Denies any headache, nausea/vomiting, abdominal pain.  Denies any changes in vision, numbness/tingling of extremities, fatigue.     Review of Systems Per HPI    Objective:   Physical Exam  Constitutional: She appears well-developed and well-nourished.       obese  HENT:  Head: Normocephalic and atraumatic.  Mouth/Throat: Oropharynx is clear and moist.  Neck: Neck supple.  Cardiovascular: Normal rate, regular rhythm and normal heart sounds.  Exam reveals no gallop and no friction rub.   No murmur heard. Pulmonary/Chest: Effort normal and breath sounds normal. She has no wheezes. She has no rales. She exhibits no tenderness.  Abdominal: Soft. She exhibits no distension. There is no tenderness.          Assessment & Plan:

## 2011-05-14 NOTE — Assessment & Plan Note (Signed)
BP currently controlled.  Improved from last visit.  Will continue regimen.  Encouraged patient to exercise and avoid high sodium foods.  Will consider referral to Dr. Gerilyn Pilgrim for diet and lifestyle modification.

## 2011-05-14 NOTE — Assessment & Plan Note (Addendum)
Patient now compliant with Metformin 500 BID.  Will get a CBG today.  Last A1c 2 months ago was 9.0.  Will repeat A1c in 4-6 weeks.  Will likely need to increase Metformin to 1000 BID at that time.  Discussed meters with patient.  She does not need to start checking sugars at home at this time.  Reassured patient that I will closely monitor sugars/a1c every 3 months and adjust medications as needed.    Encouraged patient to avoid foods high in carbohydrates and to exercise daily.  Will speak to Dr. Gerilyn Pilgrim or Dr. Raymondo Band regarding diabetic education or referral to Nutrition clinic.  Patient seems motivated to modify lifestyle.  Continue current regimen for now.  Follow up in 4-6 weeks.

## 2011-05-14 NOTE — Patient Instructions (Signed)
Thanks for coming in today. Keep up the good work - continue to walk your dog and stay active. Avoid foods high in carbohydrates. I will find out which diabetic education program will be best for you. We will call you to set up an appointment. Please return to clinic in 4-6 weeks. Have a great week!

## 2011-05-15 ENCOUNTER — Inpatient Hospital Stay (INDEPENDENT_AMBULATORY_CARE_PROVIDER_SITE_OTHER)
Admission: RE | Admit: 2011-05-15 | Discharge: 2011-05-15 | Disposition: A | Discharge status: Home / Self Care | Payer: BC Managed Care – PPO | Source: Ambulatory Visit | Attending: Family Medicine | Admitting: Family Medicine

## 2011-05-15 DIAGNOSIS — N760 Acute vaginitis: Secondary | ICD-10-CM

## 2011-05-15 LAB — WET PREP, GENITAL: Clue Cells Wet Prep HPF POC: NONE SEEN

## 2011-08-13 LAB — POCT URINALYSIS DIP (DEVICE)
Hgb urine dipstick: NEGATIVE
Protein, ur: NEGATIVE
Specific Gravity, Urine: 1.015
Urobilinogen, UA: 1

## 2011-08-13 LAB — GC/CHLAMYDIA PROBE AMP, GENITAL
Chlamydia, DNA Probe: NEGATIVE
GC Probe Amp, Genital: NEGATIVE

## 2011-09-28 ENCOUNTER — Encounter: Payer: Self-pay | Admitting: Family Medicine

## 2011-09-28 ENCOUNTER — Ambulatory Visit (INDEPENDENT_AMBULATORY_CARE_PROVIDER_SITE_OTHER): Payer: BC Managed Care – PPO | Admitting: Family Medicine

## 2011-09-28 VITALS — BP 109/76 | HR 95 | Ht 67.0 in | Wt 344.0 lb

## 2011-09-28 DIAGNOSIS — J069 Acute upper respiratory infection, unspecified: Secondary | ICD-10-CM

## 2011-09-28 NOTE — Progress Notes (Signed)
Subjective: Pt began having problems with nasal congestion, subjective fevers and chills for past 5 days.  Began taking an OTC cold med 5 days ago but had to be out of work for two days due to feeling bad.  She was feeling better as of two days ago and returned to work.  Yesterday evening her left ear started hurting badly enough that she went to the pharmacy to try to find something that would help with it.  Currently no fevers/chills but does have significant facial pain and pressure along with congestion and post-nasal drip.  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  Left TM obscured, R-TM with fluid behind it.  Throat has some cobblestoning, no adenopathy.  Nasal mucosa is boggy.  No neck pain. CV: RRR Resp: CTABL  Assessment/Plan: Viral URI - supportive treatment  Please also see individual problems in problem list for problem-specific plans.

## 2011-09-28 NOTE — Patient Instructions (Signed)
It was great to see you today! I'm sorry you aren't feeling well. I would suggest you try taking Benadryl, 25 or 50mg  at least at night.  I would also recommend that you start using some nasal saline a couple times per day before using your Flonase.  Finally, I would recommend using some Afrine nasal spray for a couple of days to help your ears drain a bit. I would also recommend that you fill your left ear with hydrogen peroxide and let it soak for 2-3 times per day for the next several weeks to try to break up some of the wax in it. Get back in to see Dr. Tye Savoy to talk about your diabetes.

## 2011-10-25 ENCOUNTER — Other Ambulatory Visit: Payer: Self-pay | Admitting: *Deleted

## 2011-10-25 MED ORDER — METFORMIN HCL 500 MG PO TABS: ORAL_TABLET | ORAL | Status: DC

## 2011-10-29 ENCOUNTER — Ambulatory Visit: Payer: BC Managed Care – PPO

## 2011-10-29 DIAGNOSIS — J0100 Acute maxillary sinusitis, unspecified: Secondary | ICD-10-CM

## 2011-10-29 DIAGNOSIS — J309 Allergic rhinitis, unspecified: Secondary | ICD-10-CM

## 2011-11-08 ENCOUNTER — Ambulatory Visit: Payer: BC Managed Care – PPO | Admitting: Family Medicine

## 2011-11-14 ENCOUNTER — Ambulatory Visit (INDEPENDENT_AMBULATORY_CARE_PROVIDER_SITE_OTHER): Payer: BC Managed Care – PPO | Admitting: Family Medicine

## 2011-11-14 ENCOUNTER — Encounter: Payer: Self-pay | Admitting: Family Medicine

## 2011-11-14 VITALS — BP 133/82 | HR 83 | Temp 97.6°F | Ht 67.0 in | Wt 336.0 lb

## 2011-11-14 DIAGNOSIS — E119 Type 2 diabetes mellitus without complications: Secondary | ICD-10-CM

## 2011-11-14 MED ORDER — METFORMIN HCL 1000 MG PO TABS: ORAL_TABLET | ORAL | Status: DC

## 2011-11-14 NOTE — Assessment & Plan Note (Signed)
A1c down from 9.0 to 7.9.  Goal < 7.0.  Increase Metformin to 1000 mg BID.  Repeat A1c in 3 months.

## 2011-11-14 NOTE — Progress Notes (Signed)
  Subjective:    Patient ID: Erin Schultz, female    DOB: 16-Nov-1965, 46 y.o.   MRN: [id number]  HPI  Patient presents to clinic for followup diabetes. She's due for A1c today. Patient checks blood glucose when she feels symptomatic- malaise, headache, dizziness. The last time she checked her glucose level was last week- 182.  Patient currently takes metformin 500 mg twice a day. Denies any adverse side effects. Patient has been carb counting and increasing physical activity. Patient says that she checks her feet rarely, but she does see an eye doctor routinely.   Denies any diabetic ulcers, peripheral neuropathy, blurry vision, headache, GI distress.  I reviewed patient's problem list, allergies, medical history, medications and social history.  Review of Systems Per history of present illness    Objective:   Physical Exam  Constitutional: No distress.  HENT:  Mouth/Throat: Oropharynx is clear and moist.  Neck: Normal range of motion. Neck supple.  Cardiovascular: Normal rate and regular rhythm.   Pulmonary/Chest: Effort normal and breath sounds normal. She has no wheezes. She has no rales.  Abdominal: Soft. Bowel sounds are normal. She exhibits no distension.       obese  Musculoskeletal: She exhibits edema. She exhibits no tenderness.       Normal sensation bilateral extremities  Skin: Skin is warm and dry.       No ulcers          Assessment & Plan:

## 2011-11-14 NOTE — Patient Instructions (Signed)
A1c down from 9.0-->7.9.  Keep up the good work! Weight down from 344 to 336. I will send a new Metformin Rx to your pharmacy.  Take 1 tablet twice a day. Watch your carb intake and continue to exercise daily. Schedule follow up appointment with PCP in 3 months.  Diets for Diabetes, Food Labeling Look at food labels to help you decide how much of a product you can eat. You will want to check the amount of total carbohydrate in a serving to see how the food fits into your meal plan. In the list of ingredients, the ingredient present in the largest amount by weight must be listed first, followed by the other ingredients in descending order. STANDARD OF IDENTITY Most products have a list of ingredients. However, foods that the Food and Drug Administration (FDA) has given a standard of identity do not need a list of ingredients. A standard of identity means that a food must contain certain ingredients if it is called a particular name. Examples are mayonnaise, peanut butter, ketchup, jelly, and cheese. LABELING TERMS There are many terms found on food labels. Some of these terms have specific definitions. Some terms are regulated by the FDA, and the FDA has clearly specified how they can be used. Others are not regulated or well-defined and can be misleading and confusing. SPECIFICALLY DEFINED TERMS Nutritive Sweetener.  A sweetener that contains calories,such as table sugar or honey.  Nonnutritive Sweetener.  A sweetener with few or no calories,such as saccharin, aspartame, sucralose, and cyclamate.  LABELING TERMS REGULATED BY THE FDA Free.  The product contains only a tiny or small amount of fat, cholesterol, sodium, sugar, or calories. For example, a "fat-free" product will contain less than 0.5 g of fat per serving.  Low.  A food described as "low" in fat, saturated fat, cholesterol, sodium, or calories could be eaten fairly often without exceeding dietary guidelines. For example, "low in  fat" means no more than 3 g of fat per serving.  Lean.  "Lean" and "extra lean" are U.S. Department of Agriculture Architect) terms for use on meat and poultry products. "Lean" means the product contains less than 10 g of fat, 4 g of saturated fat, and 95 mg of cholesterol per serving. "Lean" is not as low in fat as a product labeled "low."  Extra Lean.  "Extra lean" means the product contains less than 5 g of fat, 2 g of saturated fat, and 95 mg of cholesterol per serving. While "extra lean" has less fat than "lean," it is still higher in fat than a product labeled "low."  Reduced, Less, Fewer.  A diet product that contains 25% less of a nutrient or calories than the regular version. For example, hot dogs might be labeled "25% less fat than our regular hot dogs."  Light/Lite.  A diet product that contains ? fewer calories or  the fat of the original. For example, "light in sodium" means a product with  the usual sodium.  More.  One serving contains at least 10% more of the daily value of a vitamin, mineral, or fiber than usual.  Good Source Of.  One serving contains 10% to 19% of the daily value for a particular vitamin, mineral, or fiber.  Excellent Source Of.  One serving contains 20% or more of the daily value for a particular nutrient. Other terms used might be "high in" or "rich in."  Enriched or Fortified.  The product contains added vitamins, minerals, or protein. Nutrition  labeling must be used on enriched or fortified foods.  Imitation.  The product has been altered so that it is lower in protein, vitamins, or minerals than the usual food,such as imitation peanut butter.  Total Fat.  The number listed is the total of all fat found in a serving of the product. Under total fat, food labels must list saturated fat and trans fat, which are associated with raising bad cholesterol and an increased risk of heart blood vessel disease.  Saturated Fat.  Mainly fats from animal-based  sources. Some examples are red meat, cheese, cream, whole milk, and coconut oil.  Trans Fat.  Found in some fried snack foods, packaged foods, and fried restaurant foods. It is recommended you eat as close to 0 g of trans fat as possible, since it raises bad cholesterol and lowers good cholesterol.  Polyunsaturated and Monounsaturated Fats.  More healthful fats. These fats are from plant sources.  Total Carbohydrate.  The number of carbohydrate grams in a serving of the product. Under total carbohydrate are listed the other carbohydrate sources, such as dietary fiber and sugars.  Dietary Fiber.  A carbohydrate from plant sources.  Sugars.  Sugars listed on the label contain all naturally occurring sugars as well as added sugars.  LABELING TERMS NOT REGULATED BY THE FDA Sugarless.  Table sugar (sucrose) has not been added. However, the manufacturer may use another form of sugar in place of sucrose to sweeten the product. For example, sugar alcohols are used to sweeten foods. Sugar alcohols are a form of sugar but are not table sugar. If a product contains sugar alcohols in place of sucrose, it can still be labeled "sugarless."  Low Salt, Salt-Free, Unsalted, No Salt, No Salt Added, Without Added Salt.  Food that is usually processed with salt has been made without salt. However, the food may contain sodium-containing additives, such as preservatives, leavening agents, or flavorings.  Natural.  This term has no legal meaning.  Organic.  Foods that are certified as organic have been inspected and approved by the USDA to ensure they are produced without pesticides, fertilizers containing synthetic ingredients, bioengineering, or ionizing radiation.  Document Released: 11/01/2003 Document Revised: 07/11/2011 Document Reviewed: 05/19/2009 [facility] Patient Information 2012 [place], [place].

## 2012-01-02 ENCOUNTER — Other Ambulatory Visit: Payer: Self-pay | Admitting: Family Medicine

## 2012-01-02 NOTE — Telephone Encounter (Signed)
Patient needs a refill on her Metformin.  Apparently there was a type error on the # of refills and she wants to check that on her other meds as well.  [facility] [address]  Please giver her a call when this is done as she is almost out of her Metformin.

## 2012-01-03 MED ORDER — METFORMIN HCL 1000 MG PO TABS: ORAL_TABLET | ORAL | Status: DC

## 2012-01-03 NOTE — Telephone Encounter (Signed)
I did not give any refills because I wanted her to schedule follow up appointment with me first.  I will send it to pharmacy this time, but please call her back and schedule appointment with me soon.  Thanks.

## 2012-01-03 NOTE — Telephone Encounter (Signed)
Spoke with patient and informed of below. I scheduled appointment for patient for 3/13 @ 3:45pm

## 2012-01-23 ENCOUNTER — Ambulatory Visit: Payer: BC Managed Care – PPO | Admitting: Family Medicine

## 2012-02-06 ENCOUNTER — Encounter: Payer: Self-pay | Admitting: Family Medicine

## 2012-02-06 ENCOUNTER — Ambulatory Visit (INDEPENDENT_AMBULATORY_CARE_PROVIDER_SITE_OTHER): Payer: BC Managed Care – PPO | Admitting: Family Medicine

## 2012-02-06 VITALS — BP 122/84 | HR 68 | Temp 98.7°F | Ht 67.6 in | Wt 344.0 lb

## 2012-02-06 DIAGNOSIS — E119 Type 2 diabetes mellitus without complications: Secondary | ICD-10-CM

## 2012-02-06 LAB — POCT GLYCOSYLATED HEMOGLOBIN (HGB A1C): Hemoglobin A1C: 7.4

## 2012-02-06 MED ORDER — AMLODIPINE BESY-BENAZEPRIL HCL 5-40 MG PO CAPS: 1.0000 | ORAL_CAPSULE | Freq: Every day | ORAL | Status: DC

## 2012-02-06 MED ORDER — METFORMIN HCL 1000 MG PO TABS: 1000.0000 mg | ORAL_TABLET | Freq: Two times a day (BID) | ORAL | Status: DC

## 2012-02-06 MED ORDER — TRIAMTERENE-HCTZ 37.5-25 MG PO TABS: 1.0000 | ORAL_TABLET | Freq: Every day | ORAL | Status: DC

## 2012-02-10 NOTE — Patient Instructions (Signed)
Follow up in 3 months

## 2012-02-10 NOTE — Assessment & Plan Note (Addendum)
A1c improved from 7.9 to 7.4.  Continue Metformin 1000 mg BID.   Encouraged patient to try Weight Watchers or [employer].  Recommended annual visits with optometrist. Last Cr was 0.93 one year ago. Follow up in 3 months.  Repeat BMET at that time.

## 2012-02-10 NOTE — Progress Notes (Signed)
  Subjective:     Erin Schultz is a 46 y.o. female who presents for follow up of diabetes.. Current symptoms include: none. Patient denies foot ulcerations, hyperglycemia, hypoglycemia , nausea, paresthesia of the feet, polydipsia, polyuria, visual disturbances and vomiting. Evaluation to date has been: hemoglobin A1C. Home sugars: patient does not check sugars. Current treatments: Continued metformin which has been effective. Last dilated eye exam: patient needs to schedule one this year.  The following portions of the patient's history were reviewed and updated as appropriate: allergies, current medications, past medical history, past social history and problem list.  Review of Systems Pertinent items are noted in HPI.    Objective:    General appearance: alert, cooperative and no distress Eyes: conjunctivae/corneas clear. PERRL, EOM's intact. Fundi benign. Lungs: clear to auscultation bilaterally Heart: regular rate and rhythm, S1, S2 normal, no murmur, click, rub or gallop Extremities: extremities normal, atraumatic, no cyanosis or edema and diabetic foot exam: pin-prick sensation intact, no open ulcers or wounds, normal pulses bilaterally     Laboratory: No components found with this basename: A1C      Assessment:    Diabetes mellitus Type II, under good control.    Plan:   Continue Metformin 1000 mg BID.  Suggested low cholesterol diet. Encouraged aerobic exercise. Discussed foot care. Reminded to get yearly retinal exam. Follow up in several months or as needed.

## 2012-05-20 ENCOUNTER — Telehealth: Payer: Self-pay | Admitting: Family Medicine

## 2012-05-20 NOTE — Telephone Encounter (Signed)
Patient called to schedule her 3 mo a1c.  I did see that she was to follow up in 3 months, but it didn't specifically say if she needed to follow up with MD or for labs.  I did schedule her for a lab appt on 7/11 and Dr. Tye Savoy does have an opening that morning at 9:45, but in the mean time, can an order be entered for this lab work and if we need to schedule her to see her MD, then we can schedule it for that same day.

## 2012-05-22 ENCOUNTER — Other Ambulatory Visit: Payer: BC Managed Care – PPO

## 2012-05-26 ENCOUNTER — Other Ambulatory Visit (INDEPENDENT_AMBULATORY_CARE_PROVIDER_SITE_OTHER): Payer: BC Managed Care – PPO

## 2012-05-26 DIAGNOSIS — E119 Type 2 diabetes mellitus without complications: Secondary | ICD-10-CM

## 2012-05-26 LAB — POCT GLYCOSYLATED HEMOGLOBIN (HGB A1C): Hemoglobin A1C: 7.9

## 2012-05-26 NOTE — Progress Notes (Signed)
Pt came for A1c, confused about MD appt and lab appt.  Sent her to make MD appt before leaving. Dewitt Hoes, MLS

## 2012-05-27 NOTE — Progress Notes (Signed)
Patient ID: Erin Schultz, female   DOB: 12-Nov-1966, 46 y.o.   MRN: [id number]  Please schedule appointment with me to discuss DM.  Thanks.

## 2012-05-27 NOTE — Addendum Note (Signed)
Addended by: Tye Savoy, Ashtan Girtman on: 05/27/2012 08:44 AM   Modules accepted: Level of Service

## 2012-06-03 ENCOUNTER — Encounter: Payer: Self-pay | Admitting: Family Medicine

## 2012-06-03 ENCOUNTER — Ambulatory Visit (INDEPENDENT_AMBULATORY_CARE_PROVIDER_SITE_OTHER): Payer: BC Managed Care – PPO | Admitting: Family Medicine

## 2012-06-03 VITALS — BP 148/94 | HR 81 | Temp 97.2°F | Ht 67.0 in | Wt 350.1 lb

## 2012-06-03 DIAGNOSIS — E119 Type 2 diabetes mellitus without complications: Secondary | ICD-10-CM

## 2012-06-03 DIAGNOSIS — E669 Obesity, unspecified: Secondary | ICD-10-CM

## 2012-06-03 NOTE — Assessment & Plan Note (Signed)
Alc increased from 7.4 to 7.9.  Patient has also gained 5-6 lbs in 3 months.  Patient says she forgets to take second dose of Metformin at times, and she has not cut out chips and junk food from diet.  She also has not been walking as much due to heat.  I encouraged patient to substitute chips for baked chips and to eat sugar free desserts.  Encouraged her to exercise daily.  Patient plans to meet with Dr. Gerilyn Pilgrim.  Will continue current regimen for now (Metformin 1000 BID) and follow up in 3 months.

## 2012-06-03 NOTE — Patient Instructions (Signed)
A referral for Dr. Gerilyn Pilgrim, Nutrition, has been ordered. Please call [phone] to make an appointment with Dr. Gerilyn Pilgrim. Please remember to take Metformin twice a day. Try to walk 30 minutes everyday and substitute baked lays instead of regular chips. Schedule follow up appointment with me in October.

## 2012-06-03 NOTE — Progress Notes (Signed)
  Subjective:     Erin Schultz is a 46 y.o. female who presents for follow up of diabetes.  Patient currently takes Metformin 1000 BID, but she forgets to take second dose at night about 2-3 times per week.  She had talked about joining Weight [employer], but has not joined yet.  Due to heat, she has been walking twice per week.  She has been trying to cut back on desserts, but still eats a lot of starch foods like potato chips.  Patient denies foot ulcerations, nausea, paresthesia of the feet, visual disturbances, vomiting and weight loss. Evaluation to date has been: hemoglobin A1C. Home sugars: patient does not check sugars. Current treatments: Continued metformin which has been somewhat effective.   The following portions of the patient's history were reviewed and updated as appropriate: allergies, current medications, past medical history and problem list.  Review of Systems Pertinent items are noted in HPI.    Objective:     General appearance: alert, cooperative and no distress Throat: lips, mucosa, and tongue normal; teeth and gums normal Lungs: clear to auscultation bilaterally Heart: regular rate and rhythm, S1, S2 normal, no murmur, click, rub or gallop Extremities: extremities normal, atraumatic, no cyanosis or edema   Foot exam: no open ulcers or lesions; feet are dry and tough skin; normal pin-prick  Laboratory: No components found with this basename: A1C      Assessment:   Diabetes, Type 2   Plan:    See Problem List.

## 2012-06-04 ENCOUNTER — Encounter: Payer: Self-pay | Admitting: Family Medicine

## 2012-06-04 LAB — COMPREHENSIVE METABOLIC PANEL
AST: 12 U/L (ref 0–37)
Alkaline Phosphatase: 58 U/L (ref 39–117)
BUN: 17 mg/dL (ref 6–23)
Calcium: 9.8 mg/dL (ref 8.4–10.5)
Chloride: 103 mEq/L (ref 96–112)
Creat: 0.89 mg/dL (ref 0.50–1.10)

## 2012-07-21 ENCOUNTER — Ambulatory Visit: Payer: BC Managed Care – PPO | Admitting: Family Medicine

## 2012-08-09 ENCOUNTER — Other Ambulatory Visit: Payer: Self-pay | Admitting: Family Medicine

## 2012-09-25 ENCOUNTER — Ambulatory Visit: Payer: BC Managed Care – PPO | Admitting: Family Medicine

## 2012-10-02 ENCOUNTER — Ambulatory Visit (INDEPENDENT_AMBULATORY_CARE_PROVIDER_SITE_OTHER): Payer: BC Managed Care – PPO | Admitting: Family Medicine

## 2012-10-02 DIAGNOSIS — E669 Obesity, unspecified: Secondary | ICD-10-CM

## 2012-10-02 NOTE — Assessment & Plan Note (Signed)
Met with myself and Dr. Gerilyn Pilgrim today.  See Progress Note for Assessment/Plan.

## 2012-10-02 NOTE — Progress Notes (Signed)
  Subjective:    Patient ID: Erin Schultz, female    DOB: August 28, 1966, 46 y.o.   MRN: [id number]  HPI  Patient presents Nutrition clinic for obesity.  She started taking a supplement called "Skinny Fiber" that does actually have fiber in it.  Patient says it curbs her appetite.  Patient's weight has remain stable since 05/2012.  Patient admits to adhering to a low carb diet, but has not been physically active.  Exercise: parks further away from work building everyday, takes the elevator, walks her dog 2 days/week (10 minute walk).   Nutrition: Eats 2 meals and 2 snacks.  Eats snack (5AM), lunch (9AM), and dinner (4PM), and maybe a snack (7PM)  24-hr recall: B ( AM)- Smoked sausage link, Traditional trail mix, Crystal light water  Snk ( AM)- None L ( PM)- [employer] (4 chicken supremes), water Snk ( PM) - Homemade vegetable soup, 6 Ritz crackers   D ( PM)- Homemade vegetable soup and a fried pork chop, Sprite (regular) can   Review of Systems  Per HPI    Objective:   Physical Exam  Weight: 350.5 lb    Assessment & Plan:   Goals for this visit: 1. Join [employer] today. 2. Go to the gym 3 days per week.  Walk 30 minutes at a time. 3. Start recording a Nutrition and exercise log.  Continue my [employer]itness Pal. 4. Schedule follow up appointment with Dr. Tye Savoy in December.

## 2012-10-02 NOTE — Patient Instructions (Addendum)
Goals for Improving Healthy Living: 1. Join [employer] today. 2. Go to the gym 3 days per week.  Walk 30 minutes at a time. 3. Start recording a Nutrition and exercise log.  Continue my [employer]itness Pal. 4. Schedule follow up appointment with Dr. Tye Savoy in December.

## 2012-10-28 ENCOUNTER — Ambulatory Visit: Payer: BC Managed Care – PPO | Admitting: Family Medicine

## 2012-11-07 ENCOUNTER — Ambulatory Visit: Payer: BC Managed Care – PPO | Admitting: Family Medicine

## 2012-11-14 ENCOUNTER — Ambulatory Visit (INDEPENDENT_AMBULATORY_CARE_PROVIDER_SITE_OTHER): Payer: BC Managed Care – PPO | Admitting: Family Medicine

## 2012-11-14 ENCOUNTER — Encounter: Payer: Self-pay | Admitting: Family Medicine

## 2012-11-14 VITALS — BP 130/76 | HR 87 | Temp 98.7°F | Ht 67.0 in | Wt 348.0 lb

## 2012-11-14 DIAGNOSIS — E119 Type 2 diabetes mellitus without complications: Secondary | ICD-10-CM

## 2012-11-14 DIAGNOSIS — E669 Obesity, unspecified: Secondary | ICD-10-CM

## 2012-11-14 NOTE — Progress Notes (Signed)
  Subjective:    Patient ID: Erin Schultz, female    DOB: 06/11/1966, 47 y.o.   MRN: [id number]  HPI  Patient here for follow up Nutrition clinic.  Since then she has joined [employer] and working with Systems analyst.  Goes to gym 3 days per week.  Works out on stationary bike and treadmill.  She continues to take dietary supplement.  Has noticed clothes getting more loose.  She has cut out sodas, drinking water.  Using apple cider vinegar for cooking.  She continues to eat breakfast (bagel and yogurt), lunch/snack (popcorn and ginger ale), and dinner will be 6 PM.  Still thinking about joining my [employer].  DM:  Due for A1c today.  Taking Metformin 1000 BID.  Checks sugars at home when she is curious "on a whim" - fasting glucose 167.  Range 130-240.  Has an eye doctor who she sees routinely.  Denies any nausea/vomiting, HA, abdominal pain, foot ulcerations.   Review of Systems  Per HPI    Objective:   Physical Exam  Constitutional: She appears well-developed and well-nourished. No distress.  Cardiovascular: Normal rate, regular rhythm and normal heart sounds.   Pulmonary/Chest: Effort normal and breath sounds normal. She has no wheezes. She has no rales.  Abdominal: Soft. She exhibits no distension. There is no tenderness.  Musculoskeletal:       3+ non pitting pedal and ankle edema bilaterally  Skin: Skin is warm. No rash noted.      Assessment & Plan:

## 2012-11-14 NOTE — Patient Instructions (Addendum)
Goals for Improving Healthy Living: 1. You have lost 3 lbs since November.  Keep up the good work. 2. Your diabetes level increased from 7.9 to 8.2.  Continue to lose weight and we will recheck in 3 months. 3. Start recording a Nutrition and exercise log.  Continue my Fitness Pal. 4. Schedule follow up appointment with Dr. Tye Savoy in March.  Diet Recommendations for Diabetes   Starchy (carb) foods include: Bread, rice, pasta, potatoes, corn, crackers, bagels, muffins, all baked goods.   Protein foods include: Meat, fish, poultry, eggs, dairy foods, and beans such as pinto and kidney beans (beans also provide carbohydrate).   1. Eat at least 3 meals and 1-2 snacks per day. Never go more than 4-5 hours while awake without eating.  2. Limit starchy foods to TWO per meal and ONE per snack. ONE portion of a starchy  food is equal to the following:   - ONE slice of bread (or its equivalent, such as half of a hamburger bun).   - 1/2 cup of a "scoopable" starchy food such as potatoes or rice.   - 1 OUNCE (28 grams) of starchy snack foods such as crackers or pretzels (look on label).   - 15 grams of carbohydrate as shown on food label.  3. Both lunch and dinner should include a protein food, a carb food, and vegetables.   - Obtain twice as many veg's as protein or carbohydrate foods for both lunch and dinner.   - Try to keep frozen veg's on hand for a quick vegetable serving.     - Fresh or frozen veg's are best.  4. Breakfast should always include protein.

## 2012-11-14 NOTE — Assessment & Plan Note (Signed)
Hemoglobin A1c elevated today at 8.2 (7.9 in July 2013).  Patient recently started to exercise and become more health conscious.  She has been to Nutrition clinic and joined the gym.  Will continue Metformin 1000 BID and hold off on Glipizide for now.  Will recheck in 3 months - if still elevated, will add new medication, but hopefully she will lose weight and not need it.

## 2012-11-14 NOTE — Assessment & Plan Note (Signed)
Goals for Improving Healthy Living: 1. You have lost 3 lbs since November.  Keep up the good work. 2. Start recording a Nutrition and exercise log or join My Fitness Pal. 3. Schedule follow up appointment with Dr. Tye Savoy in late Feb or March. 4. Consider Nutrition follow up with Dr. Gerilyn Pilgrim and me in Feb.

## 2012-12-15 ENCOUNTER — Encounter (HOSPITAL_COMMUNITY): Payer: Self-pay | Admitting: Emergency Medicine

## 2012-12-15 ENCOUNTER — Encounter (HOSPITAL_COMMUNITY): Payer: Self-pay | Admitting: Anesthesiology

## 2012-12-15 ENCOUNTER — Emergency Department (HOSPITAL_COMMUNITY): Payer: BC Managed Care – PPO | Admitting: Anesthesiology

## 2012-12-15 ENCOUNTER — Inpatient Hospital Stay (HOSPITAL_COMMUNITY)
Admission: EM | Admit: 2012-12-15 | Discharge: 2012-12-30 | DRG: 581 | Disposition: A | Discharge status: Home Health | Payer: BC Managed Care – PPO | Attending: Internal Medicine | Admitting: Internal Medicine

## 2012-12-15 ENCOUNTER — Encounter (HOSPITAL_COMMUNITY)
Admission: EM | Disposition: A | Discharge status: Home Health | Payer: Self-pay | Source: Home / Self Care | Attending: Pulmonary Disease

## 2012-12-15 ENCOUNTER — Emergency Department (HOSPITAL_COMMUNITY): Payer: BC Managed Care – PPO

## 2012-12-15 DIAGNOSIS — K650 Generalized (acute) peritonitis: Secondary | ICD-10-CM

## 2012-12-15 DIAGNOSIS — E440 Moderate protein-calorie malnutrition: Secondary | ICD-10-CM | POA: Diagnosis not present

## 2012-12-15 DIAGNOSIS — R198 Other specified symptoms and signs involving the digestive system and abdomen: Secondary | ICD-10-CM

## 2012-12-15 DIAGNOSIS — R6521 Severe sepsis with septic shock: Secondary | ICD-10-CM | POA: Diagnosis present

## 2012-12-15 DIAGNOSIS — K56609 Unspecified intestinal obstruction, unspecified as to partial versus complete obstruction: Secondary | ICD-10-CM

## 2012-12-15 DIAGNOSIS — E119 Type 2 diabetes mellitus without complications: Secondary | ICD-10-CM

## 2012-12-15 DIAGNOSIS — I129 Hypertensive chronic kidney disease with stage 1 through stage 4 chronic kidney disease, or unspecified chronic kidney disease: Secondary | ICD-10-CM | POA: Diagnosis present

## 2012-12-15 DIAGNOSIS — K469 Unspecified abdominal hernia without obstruction or gangrene: Secondary | ICD-10-CM

## 2012-12-15 DIAGNOSIS — K660 Peritoneal adhesions (postprocedural) (postinfection): Secondary | ICD-10-CM | POA: Diagnosis present

## 2012-12-15 DIAGNOSIS — J9601 Acute respiratory failure with hypoxia: Secondary | ICD-10-CM | POA: Diagnosis present

## 2012-12-15 DIAGNOSIS — N170 Acute kidney failure with tubular necrosis: Secondary | ICD-10-CM | POA: Diagnosis not present

## 2012-12-15 DIAGNOSIS — D649 Anemia, unspecified: Secondary | ICD-10-CM | POA: Diagnosis not present

## 2012-12-15 DIAGNOSIS — K631 Perforation of intestine (nontraumatic): Secondary | ICD-10-CM | POA: Diagnosis present

## 2012-12-15 DIAGNOSIS — R6520 Severe sepsis without septic shock: Secondary | ICD-10-CM | POA: Diagnosis present

## 2012-12-15 DIAGNOSIS — G4733 Obstructive sleep apnea (adult) (pediatric): Secondary | ICD-10-CM

## 2012-12-15 DIAGNOSIS — K659 Peritonitis, unspecified: Secondary | ICD-10-CM | POA: Diagnosis present

## 2012-12-15 DIAGNOSIS — E876 Hypokalemia: Secondary | ICD-10-CM | POA: Diagnosis not present

## 2012-12-15 DIAGNOSIS — R5381 Other malaise: Secondary | ICD-10-CM | POA: Diagnosis not present

## 2012-12-15 DIAGNOSIS — Z6841 Body Mass Index (BMI) 40.0 and over, adult: Secondary | ICD-10-CM

## 2012-12-15 DIAGNOSIS — K430 Incisional hernia with obstruction, without gangrene: Secondary | ICD-10-CM

## 2012-12-15 DIAGNOSIS — A419 Sepsis, unspecified organism: Principal | ICD-10-CM | POA: Diagnosis present

## 2012-12-15 DIAGNOSIS — J309 Allergic rhinitis, unspecified: Secondary | ICD-10-CM

## 2012-12-15 DIAGNOSIS — I10 Essential (primary) hypertension: Secondary | ICD-10-CM

## 2012-12-15 DIAGNOSIS — Z79899 Other long term (current) drug therapy: Secondary | ICD-10-CM

## 2012-12-15 DIAGNOSIS — J95821 Acute postprocedural respiratory failure: Secondary | ICD-10-CM | POA: Diagnosis not present

## 2012-12-15 DIAGNOSIS — E669 Obesity, unspecified: Secondary | ICD-10-CM

## 2012-12-15 DIAGNOSIS — N189 Chronic kidney disease, unspecified: Secondary | ICD-10-CM | POA: Diagnosis present

## 2012-12-15 DIAGNOSIS — K421 Umbilical hernia with gangrene: Secondary | ICD-10-CM | POA: Diagnosis present

## 2012-12-15 HISTORY — DX: Type 2 diabetes mellitus without complications: E11.9

## 2012-12-15 HISTORY — PX: LAPAROTOMY: SHX154

## 2012-12-15 HISTORY — DX: Essential (primary) hypertension: I10

## 2012-12-15 LAB — COMPREHENSIVE METABOLIC PANEL
ALT: 20 U/L (ref 0–35)
AST: 21 U/L (ref 0–37)
Albumin: 3.1 g/dL — ABNORMAL LOW (ref 3.5–5.2)
Alkaline Phosphatase: 92 U/L (ref 39–117)
BUN: 24 mg/dL — ABNORMAL HIGH (ref 6–23)
CO2: 23 mEq/L (ref 19–32)
Calcium: 9.8 mg/dL (ref 8.4–10.5)
Chloride: 96 mEq/L (ref 96–112)
Creatinine, Ser: 1.64 mg/dL — ABNORMAL HIGH (ref 0.50–1.10)
GFR calc Af Amer: 42 mL/min — ABNORMAL LOW (ref >90)
GFR calc non Af Amer: 36 mL/min — ABNORMAL LOW (ref >90)
Glucose, Bld: 214 mg/dL — ABNORMAL HIGH (ref 70–99)
Potassium: 4.3 mEq/L (ref 3.5–5.1)
Sodium: 133 mEq/L — ABNORMAL LOW (ref 135–145)
Total Bilirubin: 0.6 mg/dL (ref 0.3–1.2)
Total Protein: 8 g/dL (ref 6.0–8.3)

## 2012-12-15 LAB — CBC WITH DIFFERENTIAL/PLATELET
Basophils Absolute: 0 10*3/uL (ref 0.0–0.1)
Basophils Relative: 0 % (ref 0–1)
Eosinophils Absolute: 0 10*3/uL (ref 0.0–0.7)
Eosinophils Relative: 0 % (ref 0–5)
Hemoglobin: 11.8 g/dL — ABNORMAL LOW (ref 12.0–15.0)
Lymphs Abs: 3.3 10*3/uL (ref 0.7–4.0)
MCH: 27.8 pg (ref 26.0–34.0)
Monocytes Absolute: 1.8 10*3/uL — ABNORMAL HIGH (ref 0.1–1.0)
Monocytes Relative: 7 % (ref 3–12)
Myelocytes: 0 %
Neutro Abs: 19.9 10*3/uL — ABNORMAL HIGH (ref 1.7–7.7)
Neutrophils Relative %: 80 % — ABNORMAL HIGH (ref 43–77)
RBC: 4.24 MIL/uL (ref 3.87–5.11)
WBC: 25 10*3/uL — ABNORMAL HIGH (ref 4.0–10.5)

## 2012-12-15 LAB — TYPE AND SCREEN
ABO/RH(D): B POS
Antibody Screen: NEGATIVE

## 2012-12-15 LAB — URINALYSIS, ROUTINE W REFLEX MICROSCOPIC
Nitrite: POSITIVE — AB
Specific Gravity, Urine: 1.028 (ref 1.005–1.030)
Urobilinogen, UA: 4 mg/dL — ABNORMAL HIGH (ref 0.0–1.0)
pH: 5 (ref 5.0–8.0)

## 2012-12-15 LAB — URINE MICROSCOPIC-ADD ON

## 2012-12-15 LAB — LIPASE, BLOOD: Lipase: 41 U/L (ref 11–59)

## 2012-12-15 LAB — PROTIME-INR: Prothrombin Time: 15 seconds (ref 11.6–15.2)

## 2012-12-15 SURGERY — LAPAROTOMY, EXPLORATORY
Anesthesia: General | Site: Abdomen | Wound class: Clean Contaminated

## 2012-12-15 MED ORDER — SODIUM CHLORIDE 0.9 % IV SOLN
1000.0000 mL | Freq: Once | INTRAVENOUS | Status: AC
  Administered 2012-12-15: 1000 mL via INTRAVENOUS

## 2012-12-15 MED ORDER — SODIUM CHLORIDE 0.9 % IR SOLN
Status: DC | PRN
  Administered 2012-12-15: 3000 mL

## 2012-12-15 MED ORDER — SUCCINYLCHOLINE CHLORIDE 20 MG/ML IJ SOLN
INTRAMUSCULAR | Status: DC | PRN
  Administered 2012-12-15: 200 mg via INTRAVENOUS

## 2012-12-15 MED ORDER — MIDAZOLAM HCL 5 MG/5ML IJ SOLN
INTRAMUSCULAR | Status: DC | PRN
  Administered 2012-12-15: 2 mg via INTRAVENOUS

## 2012-12-15 MED ORDER — LACTATED RINGERS IV SOLN
INTRAVENOUS | Status: DC | PRN
  Administered 2012-12-15 – 2012-12-16 (×3): via INTRAVENOUS

## 2012-12-15 MED ORDER — HYDROMORPHONE HCL PF 1 MG/ML IJ SOLN
1.0000 mg | Freq: Once | INTRAMUSCULAR | Status: AC
  Administered 2012-12-15: 1 mg via INTRAVENOUS
  Filled 2012-12-15: qty 1

## 2012-12-15 MED ORDER — SODIUM CHLORIDE 0.9 % IV SOLN
1.0000 g | INTRAVENOUS | Status: AC
  Administered 2012-12-15: 1 g via INTRAVENOUS
  Filled 2012-12-15: qty 1

## 2012-12-15 MED ORDER — PROPOFOL 10 MG/ML IV BOLUS
INTRAVENOUS | Status: DC | PRN
  Administered 2012-12-15: 120 mg via INTRAVENOUS

## 2012-12-15 MED ORDER — PHENYLEPHRINE HCL 10 MG/ML IJ SOLN
10.0000 mg | INTRAVENOUS | Status: DC | PRN
  Administered 2012-12-15: 20 ug/min via INTRAVENOUS

## 2012-12-15 MED ORDER — INSULIN ASPART 100 UNIT/ML ~~LOC~~ SOLN
0.0000 [IU] | SUBCUTANEOUS | Status: DC
  Filled 2012-12-15: qty 3

## 2012-12-15 MED ORDER — ROCURONIUM BROMIDE 100 MG/10ML IV SOLN
INTRAVENOUS | Status: DC | PRN
  Administered 2012-12-15: 40 mg via INTRAVENOUS
  Administered 2012-12-15: 10 mg via INTRAVENOUS

## 2012-12-15 MED ORDER — IOHEXOL 300 MG/ML  SOLN
25.0000 mL | INTRAMUSCULAR | Status: AC
  Administered 2012-12-15 (×2): 25 mL via ORAL

## 2012-12-15 MED ORDER — LIDOCAINE HCL (CARDIAC) 20 MG/ML IV SOLN
INTRAVENOUS | Status: DC | PRN
  Administered 2012-12-15: 100 mg via INTRAVENOUS

## 2012-12-15 MED ORDER — SODIUM CHLORIDE 0.9 % IV SOLN
INTRAVENOUS | Status: DC
  Administered 2012-12-15: 1000 mL via INTRAVENOUS

## 2012-12-15 MED ORDER — IOHEXOL 300 MG/ML  SOLN
80.0000 mL | Freq: Once | INTRAMUSCULAR | Status: AC | PRN
  Administered 2012-12-15: 80 mL via INTRAVENOUS

## 2012-12-15 MED ORDER — VECURONIUM BROMIDE 10 MG IV SOLR
INTRAVENOUS | Status: DC | PRN
  Administered 2012-12-15 (×2): 2 mg via INTRAVENOUS
  Administered 2012-12-16: 5 mg via INTRAVENOUS
  Administered 2012-12-16: 1 mg via INTRAVENOUS

## 2012-12-15 MED ORDER — ALBUMIN HUMAN 5 % IV SOLN
INTRAVENOUS | Status: DC | PRN
  Administered 2012-12-15 (×2): via INTRAVENOUS

## 2012-12-15 MED ORDER — PIPERACILLIN-TAZOBACTAM 3.375 G IVPB
3.3750 g | Freq: Once | INTRAVENOUS | Status: AC
  Administered 2012-12-15: 3.375 g via INTRAVENOUS
  Filled 2012-12-15: qty 50

## 2012-12-15 MED ORDER — FENTANYL CITRATE 0.05 MG/ML IJ SOLN
INTRAMUSCULAR | Status: DC | PRN
  Administered 2012-12-15: 100 ug via INTRAVENOUS
  Administered 2012-12-15: 50 ug via INTRAVENOUS
  Administered 2012-12-16: 100 ug via INTRAVENOUS

## 2012-12-15 MED ORDER — SODIUM CHLORIDE 0.9 % IV SOLN: 1000.0000 mL | INTRAVENOUS | Status: DC

## 2012-12-15 MED ORDER — PHENYLEPHRINE HCL 10 MG/ML IJ SOLN
INTRAMUSCULAR | Status: DC | PRN
  Administered 2012-12-15: 80 ug via INTRAVENOUS
  Administered 2012-12-15: 120 ug via INTRAVENOUS

## 2012-12-15 MED ORDER — ONDANSETRON HCL 4 MG/2ML IJ SOLN
4.0000 mg | Freq: Once | INTRAMUSCULAR | Status: AC
  Administered 2012-12-15: 4 mg via INTRAVENOUS
  Filled 2012-12-15: qty 2

## 2012-12-15 SURGICAL SUPPLY — 52 items
BANDAGE GAUZE 4  KLING STR (GAUZE/BANDAGES/DRESSINGS) ×2 IMPLANT
BLADE SURG ROTATE 9660 (MISCELLANEOUS) IMPLANT
CANISTER SUCTION 2500CC (MISCELLANEOUS) IMPLANT
CANISTER WOUND CARE 500ML ATS (WOUND CARE) ×2 IMPLANT
CHLORAPREP W/TINT 26ML (MISCELLANEOUS) ×4 IMPLANT
CLEANER TIP ELECTROSURG 2X2 (MISCELLANEOUS) ×2 IMPLANT
CLOTH BEACON ORANGE TIMEOUT ST (SAFETY) ×2 IMPLANT
COVER MAYO STAND STRL (DRAPES) IMPLANT
COVER SURGICAL LIGHT HANDLE (MISCELLANEOUS) ×2 IMPLANT
DRAPE LAPAROSCOPIC ABDOMINAL (DRAPES) ×2 IMPLANT
DRAPE UTILITY 15X26 W/TAPE STR (DRAPE) ×4 IMPLANT
DRAPE WARM FLUID 44X44 (DRAPE) ×2 IMPLANT
ELECT BLADE 6.5 EXT (BLADE) IMPLANT
ELECT CAUTERY BLADE 6.4 (BLADE) ×4 IMPLANT
ELECT REM PT RETURN 9FT ADLT (ELECTROSURGICAL) ×2
ELECTRODE REM PT RTRN 9FT ADLT (ELECTROSURGICAL) ×1 IMPLANT
GLOVE BIO SURGEON STRL SZ 6 (GLOVE) ×10 IMPLANT
GLOVE BIO SURGEON STRL SZ7.5 (GLOVE) ×12 IMPLANT
GLOVE BIO SURGEON STRL SZ8 (GLOVE) ×4 IMPLANT
GLOVE BIOGEL PI IND STRL 6.5 (GLOVE) ×4 IMPLANT
GLOVE BIOGEL PI IND STRL 8 (GLOVE) ×4 IMPLANT
GLOVE BIOGEL PI INDICATOR 6.5 (GLOVE) ×4
GLOVE BIOGEL PI INDICATOR 8 (GLOVE) ×4
GOWN PREVENTION PLUS XXLARGE (GOWN DISPOSABLE) ×2 IMPLANT
GOWN STRL NON-REIN LRG LVL3 (GOWN DISPOSABLE) ×4 IMPLANT
GOWN STRL REIN 3XL XLG LVL4 (GOWN DISPOSABLE) ×4 IMPLANT
KIT BASIN OR (CUSTOM PROCEDURE TRAY) ×2 IMPLANT
KIT ROOM TURNOVER OR (KITS) ×2 IMPLANT
LIGASURE IMPACT 36 18CM CVD LR (INSTRUMENTS) ×2 IMPLANT
NS IRRIG 1000ML POUR BTL (IV SOLUTION) ×6 IMPLANT
PACK GENERAL/GYN (CUSTOM PROCEDURE TRAY) ×2 IMPLANT
PAD ARMBOARD 7.5X6 YLW CONV (MISCELLANEOUS) ×4 IMPLANT
PAD SHARPS MAGNETIC DISPOSAL (MISCELLANEOUS) IMPLANT
RELOAD PROXIMATE 75MM BLUE (ENDOMECHANICALS) ×4 IMPLANT
SPECIMEN JAR X LARGE (MISCELLANEOUS) IMPLANT
SPONGE ABDOMINAL VAC ABTHERA (MISCELLANEOUS) ×2 IMPLANT
SPONGE LAP 18X18 X RAY DECT (DISPOSABLE) ×2 IMPLANT
STAPLER PROXIMATE 75MM BLUE (STAPLE) ×2 IMPLANT
STAPLER TA90 4.8 THK SLIMI (STAPLE) ×2 IMPLANT
STAPLER VISISTAT 35W (STAPLE) IMPLANT
SUCTION POOLE TIP (SUCTIONS) ×4 IMPLANT
SUT PDS II 0 TP-1 LOOPED 60 (SUTURE) IMPLANT
SUT VIC AB 2-0 SH 18 (SUTURE) ×2 IMPLANT
SUT VIC AB 3-0 SH 18 (SUTURE) ×4 IMPLANT
SUT VICRYL 4-0 PS2 18IN ABS (SUTURE) IMPLANT
SUT VICRYL AB 2 0 TIES (SUTURE) ×2 IMPLANT
SUT VICRYL AB 3 0 TIES (SUTURE) ×2 IMPLANT
SYR BULB IRRIGATION 50ML (SYRINGE) ×2 IMPLANT
TOWEL OR 17X26 10 PK STRL BLUE (TOWEL DISPOSABLE) ×6 IMPLANT
TRAY FOLEY CATH 14FRSI W/METER (CATHETERS) IMPLANT
WATER STERILE IRR 1000ML POUR (IV SOLUTION) IMPLANT
YANKAUER SUCT BULB TIP NO VENT (SUCTIONS) ×2 IMPLANT

## 2012-12-15 NOTE — ED Notes (Signed)
Patient transported to CT 

## 2012-12-15 NOTE — Anesthesia Preprocedure Evaluation (Addendum)
Anesthesia Evaluation  Patient identified by MRN, date of birth, ID band Patient awake    Reviewed: Allergy & Precautions, H&P , Patient's Chart, lab work & pertinent test results, reviewed documented beta blocker date and time   Airway Mallampati: II TM Distance: >3 FB Neck ROM: Full    Dental  (+) Teeth Intact and Dental Advisory Given   Pulmonary          Cardiovascular hypertension, Pt. on medications     Neuro/Psych    GI/Hepatic hiatal hernia,   Endo/Other  diabetes, Type 2, Oral Hypoglycemic AgentsMorbid obesity  Renal/GU Renal InsufficiencyRenal disease     Musculoskeletal   Abdominal   Peds  Hematology   Anesthesia Other Findings   Reproductive/Obstetrics                          Anesthesia Physical Anesthesia Plan  ASA: III and emergent  Anesthesia Plan: General   Post-op Pain Management:    Induction: Intravenous, Rapid sequence and Cricoid pressure planned  Airway Management Planned: Oral ETT  Additional Equipment:   Intra-op Plan:   Post-operative Plan: Extubation in OR  Informed Consent: I have reviewed the patients History and Physical, chart, labs and discussed the procedure including the risks, benefits and alternatives for the proposed anesthesia with the patient or authorized representative who has indicated his/her understanding and acceptance.   Dental advisory given  Plan Discussed with: CRNA and Surgeon  Anesthesia Plan Comments:        Anesthesia Quick Evaluation

## 2012-12-15 NOTE — ED Notes (Signed)
Date: 12/15/2012  Rate: 111   Rhythm: sinus tachycardia  QRS Axis: normal  Intervals: normal  ST/T Wave abnormalities: normal  Conduction Disutrbances:none  Narrative Interpretation: Sinus tachycardia  Old EKG Reviewed: none available   Jimmye Norman, NP 12/15/12 2329  Raeford Razor, MD 11/29/14 [phone]

## 2012-12-15 NOTE — ED Notes (Signed)
Two attempts made at NG tube placement with 14 french.  Felicie Morn made aware

## 2012-12-15 NOTE — ED Notes (Signed)
Dr Ermalinda Memos in room with pt

## 2012-12-15 NOTE — ED Provider Notes (Signed)
History     CSN: [id number]  Arrival date & time 12/15/12  1312   First MD Initiated Contact with Patient 12/15/12 1532      Chief Complaint  Patient presents with  . Abdominal Pain  . Emesis    (Consider location/radiation/quality/duration/timing/severity/associated sxs/prior treatment) Patient is a 47 y.o. female presenting with abdominal pain and vomiting. The history is provided by the patient. No language interpreter was used.  Abdominal Pain The primary symptoms of the illness include abdominal pain and vomiting. The current episode started more than 2 days ago. The onset of the illness was gradual. The problem has been gradually worsening.  The patient has had a change in bowel habit. Additional symptoms associated with the illness include chills and constipation. Significant associated medical issues include diabetes. Significant associated medical issues do not include diverticulitis.  Emesis  Associated symptoms include abdominal pain and chills.  Patient seen at urgent care last week for abdominal pain.  Prescribed pain medication.  Pain persists with increasing distention of left upper and lower quadrants.  Surgical history:  Appendectomy, complete hysterectomy   Past Medical History  Diagnosis Date  . Hypertension   . Diabetes mellitus without complication     History reviewed. No pertinent past surgical history.  History reviewed. No pertinent family history.  History  Substance Use Topics  . Smoking status: Never Smoker   . Smokeless tobacco: Not on file  . Alcohol Use: No    OB History    Grav Para Term Preterm Abortions TAB SAB Ect Mult Living                  Review of Systems  Constitutional: Positive for chills.  Gastrointestinal: Positive for vomiting, abdominal pain and constipation.  All other systems reviewed and are negative.    Allergies  Corn-containing products and Sulfa antibiotics  Home Medications   Current Outpatient Rx    Name  Route  Sig  Dispense  Refill  . AMLODIPINE BESY-BENAZEPRIL HCL 5-40 MG PO CAPS   Oral   Take 1 capsule by mouth daily.         Marland Kitchen METFORMIN HCL 1000 MG PO TABS      TAKE 1 TABLET BY MOUTH TWICE A DAY WITH MEALS   60 tablet   5   . ADULT MULTIVITAMIN W/MINERALS CH   Oral   Take 1 tablet by mouth daily.         . TRIAMTERENE-HCTZ 37.5-25 MG PO TABS   Oral   Take 1 tablet by mouth daily.         Marland Kitchen FEXOFENADINE HCL 180 MG PO TABS   Oral   Take 180 mg by mouth daily as needed. For seasonal allergies           BP 103/60  Pulse 105  Temp 98.2 F (36.8 C) (Oral)  Resp 18  SpO2 99%  Physical Exam  Nursing note and vitals reviewed. Constitutional: She is oriented to person, place, and time. She appears well-developed and well-nourished.  HENT:  Head: Normocephalic.  Eyes: Pupils are equal, round, and reactive to light.  Neck: Normal range of motion.  Cardiovascular: Normal rate and regular rhythm.   Pulmonary/Chest: Effort normal and breath sounds normal.  Abdominal: She exhibits distension. There is tenderness in the left upper quadrant and left lower quadrant. There is rigidity.    Musculoskeletal: Normal range of motion.  Lymphadenopathy:    She has cervical adenopathy.  Neurological: She is alert  and oriented to person, place, and time.  Skin: Skin is warm and dry.  Psychiatric: She has a normal mood and affect. Her behavior is normal. Judgment and thought content normal.    ED Course  Procedures (including critical care time)  Labs Reviewed  CBC WITH DIFFERENTIAL - Abnormal; Notable for the following:    WBC 25.0 (*)     Hemoglobin 11.8 (*)     HCT 35.1 (*)     Neutrophils Relative 80 (*)     Neutro Abs 19.9 (*)     Monocytes Absolute 1.8 (*)     All other components within normal limits  COMPREHENSIVE METABOLIC PANEL - Abnormal; Notable for the following:    Sodium 133 (*)     Glucose, Bld 214 (*)     BUN 24 (*)     Creatinine, Ser 1.64  (*)     Albumin 3.1 (*)     GFR calc non Af Amer 36 (*)     GFR calc Af Amer 42 (*)     All other components within normal limits  URINALYSIS, ROUTINE W REFLEX MICROSCOPIC - Abnormal; Notable for the following:    Color, Urine ORANGE (*)  BIOCHEMICALS MAY BE AFFECTED BY COLOR   APPearance TURBID (*)     Bilirubin Urine MODERATE (*)     Ketones, ur 15 (*)     Protein, ur 30 (*)     Urobilinogen, UA 4.0 (*)     Nitrite POSITIVE (*)     Leukocytes, UA SMALL (*)     All other components within normal limits  URINE MICROSCOPIC-ADD ON - Abnormal; Notable for the following:    Squamous Epithelial / LPF MANY (*)     Bacteria, UA MANY (*)     Casts GRANULAR CAST (*)     All other components within normal limits  LIPASE, BLOOD  URINE CULTURE   No results found.   No diagnosis found.  Patient recently evaluated at an urgent care center for abdominal pain.  Patient with continuation of pain along with development of nausea and vomiting.  Abdomen distended, LUQ and LLQ tenderness and rigidity.  [place] count of [address].  CT reveals "Large anterior abdominal wall hernia through which fat and small bowel traverses. This is causing small bowel obstruction with perforated bowel with contained complex air fluid collection deep to the hernia extending into the lower abdomen and upper pelvis."  Patient seen by Dr. Patria Mane, who consulted surgery--they will be in to see patient.     MDM          Jimmye Norman, NP 12/15/12 2103

## 2012-12-15 NOTE — H&P (Signed)
Family Medicine Teaching [facility] Admission History and Physical Service Pager: [phone]  Patient name: Erin Schultz Medical record number: [id number] Date of birth: 06/13/1966 Age: 47 y.o. Gender: female  Primary Care Provider: DE LA CRUZ,IVY, DO  Chief Complaint: Abdominal pain  History of Present Illness: Erin Schultz is a 47 y.o. year old female presenting with worsening abdominal pain for the last 6 days. She says that it came on suddenly and she attributed it to something that she ate. The pain is sharp and mostly in her lower left abdomen and has not improved since onset. She has also had nausea and vomiting (NBNB) for several days that was not relieved with phenergan that she got from an urgent care. She also notes several days of clear diarrhea. For the last two days she's had subjective fevers and sweats. She denies chest pain and shortness of breath.   In the ED she has been seen by surgery and she is being taken to the OR currently.   Patient Active Problem List  Diagnosis  . DIABETES MELLITUS, TYPE II  . HYPERLIPIDEMIA  . OBESITY, NOS  . HYPERTENSION, BENIGN SYSTEMIC  . RHINITIS, ALLERGIC  . DERMATITIS, ATOPIC   Past Medical History: Past Medical History  Diagnosis Date  . Hypertension   . Diabetes mellitus without complication   . Chronic kidney disease   . H/O hiatal hernia    Past Surgical History: Past Surgical History  Procedure Date  . Cholecystectomy   . Appendectomy   . Abdominal hysterectomy    Social History: History  Substance Use Topics  . Smoking status: Never Smoker   . Smokeless tobacco: Not on file  . Alcohol Use: No   For any additional social history documentation, please refer to relevant sections of EMR.  Family History: History reviewed. No pertinent family history. Allergies: Allergies  Allergen Reactions  . Corn-Containing Products Nausea And Vomiting    Stomach inflamed  . Sulfa Antibiotics     Childhood reaction    No current facility-administered medications on file prior to encounter.   Current Outpatient Prescriptions on File Prior to Encounter  Medication Sig Dispense Refill  . metFORMIN (GLUCOPHAGE) 1000 MG tablet TAKE 1 TABLET BY MOUTH TWICE A DAY WITH MEALS  60 tablet  5  . fexofenadine (ALLEGRA) 180 MG tablet Take 180 mg by mouth daily as needed. For seasonal allergies       Review Of Systems: Per HPI, otherwise 12 point review of systems was performed and was unremarkable.  Physical Exam: BP 100/63  Pulse 110  Temp 100.8 F (38.2 C) (Oral)  Resp 24  SpO2 95% Exam:  Gen: NAD, alert, resting comfortably, cooperative with exam HEENT: NCAT, MMM CV: Tachycardic, regular rhythm, good S1/S2, no murmur Resp: CTABL, no wheezes, non-labored Abd: Soft with area of firmness around her periumbilical region. Difficult to measure to body habitus; non tender without guarding or organomegaly. Midline, healed, old scar above and below umbilicus  Ext: Trace edema, warm and well perfused Neuro: Alert and oriented, No gross deficits   Labs and Imaging: CBC BMET   Lab 12/15/12 1323  WBC 25.0*  HGB 11.8*  HCT 35.1*  PLT 394    Lab 12/15/12 1323  NA 133*  K 4.3  CL 96  CO2 23  BUN 24*  CREATININE 1.64*  GLUCOSE 214*  CALCIUM 9.8     CT abdomen 12/15/2012 IMPRESSION:  The present examination do not include the patients entire abdomen  secondary to patient's  size. The maximal field of view was  utilized.  Large anterior abdominal wall hernia through which fat and small  bowel traverses. This is causing small bowel obstruction with  perforated bowel with contained complex air fluid collection deep  to the hernia extending into the lower abdomen and upper pelvis.  Small scarred left kidney. Right kidney poorly functioning as on  delayed imaging of contrast is not seen within the collecting  system.  Fatty infiltration of the liver.  Assessment and Plan: Erin Schultz is a 47 y.o.  year old female presenting with PMH of HTN, and DM2, here with an incarcerated hernia leading to SBO and perforation. Currently she is tachycardia, slightly tachypnic, and with an elevated WBC but overall hemodynamically stable. Her source of SIRS appears to be primarily the perforated bowel but is also likely contributed to by a UTI.   Perforated bowel - Appears to be 2/2 incarcerated hernia from and subsequent SBO - Surgery managing and consulting- appreciate recommendations - Pain control and advancement of diet per surgery, NPO for now   Sepsis - SIRS criteria + source of Perforated bowel or UTI - Covering currently with Zosyn and Ertapenem - Admit to step down - AM CBC, follow fever curve  UTI - Covered adequately with Zosyn and ertapenem - Urine culture pending  Acute Kidney Injury - Cre increased to 1.64 today, baseline around 0.8 - Etiology is likely pre-renal with her Hx of vomiting and possibly contributed to by her UTI - Continue IV NS at 125 mL/hr and avoid nephrotoxic agents  - BMP in the Am  DM2: HgbA1c 8.2 - Hold home metformin - Q4 SSI for now  - monitor CBGs Q4 while NPO  HTN - WIth low BP tonight (94-103/54-60) will hold home bp meds - hold: Amlodipine, Benazepril, triamterene, HCTZ - Restart as necessary  FEN/GI: NPO, NS at 125 mL/hr; will advance diet per Surgery recommendations Prophylaxis: SCDs Disposition: Step down, Home when SIRS resolved and surgical issues addressed Code Status: Full  Kevin Fenton, MD 12/15/2012, 9:56 PM  PGY-3 Addendum: Patient seen and examined independently.  Available data reviewed.  Agree with findings, assessment, and plan as outlined by Dr. Ermalinda Memos.  My additional findings are documented and highlighted above.  [place], DO 12/15/2012 11:18 PM

## 2012-12-15 NOTE — ED Notes (Signed)
Pt c/o LLQ pain x 6 days with N/V

## 2012-12-15 NOTE — Preoperative (Signed)
Beta Blockers   Reason not to administer Beta Blockers:Not Applicable. No home beta blockers 

## 2012-12-15 NOTE — H&P (Signed)
Erin Schultz is an 47 y.o. female.   Chief Complaint: Abdominal Pain.   HPI:  47 year old female who presents with proximally 6 days of abdominal pain.  It has been getting worse over the past 6 days, but she has not had any acute worsening. She went urgent care last week and they gave her Phenergan. She did not receive any studies at that time.  As it the pain did not become better she saw help at the emergency department.  She has developed nausea and vomiting and is not able to keep anything down in several days.  She has not felt dizzy or lightheaded. She has never had pain like this before. A CT scan demonstrates a hernia, but she was unaware of ever having a hernia. She has had abdominal hysterectomy and had evisceration from the incision area 20 years ago.  She denies fevers and chills. She denies contacts. She has been exercising more and has lost some weight recently.  Past Medical History  Diagnosis Date  . Hypertension   . Diabetes mellitus without complication     Past surgical history. Appendectomy Lap chole Abdominal hysterectomy  History reviewed. No pertinent family history. Social History:  reports that she has never smoked. She does not have any smokeless tobacco history on file. She reports that she does not drink alcohol or use illicit drugs.  Allergies:  Allergies  Allergen Reactions  . Corn-Containing Products Nausea And Vomiting    Stomach inflamed  . Sulfa Antibiotics     Childhood reaction   MedicationsLong-Term  Prescriptions Show Facility-Administered Medications    amLODipine-benazepril (LOTREL) 5-40 MG per capsule   metFORMIN (GLUCOPHAGE) 1000 MG tablet   Multiple Vitamin (MULTIVITAMIN WITH MINERALS) TABS   triamterene-hydrochlorothiazide (MAXZIDE-25) 37.5-25 MG per tablet   fexofenadine (ALLEGRA) 180 MG tablet        Results for orders placed during the hospital encounter of 12/15/12 (from the past 48 hour(s))  CBC WITH DIFFERENTIAL     Status:  Abnormal   Collection Time   12/15/12  1:23 PM      Component Value Range Comment   WBC 25.0 (*) 4.0 - 10.5 K/uL    RBC 4.24  3.87 - 5.11 MIL/uL    Hemoglobin 11.8 (*) 12.0 - 15.0 g/dL    HCT 11.9 (*) 14.7 - 46.0 %    MCV 82.8  78.0 - 100.0 fL    MCH 27.8  26.0 - 34.0 pg    MCHC 33.6  30.0 - 36.0 g/dL    RDW 82.9  56.2 - 13.0 %    Platelets 394  150 - 400 K/uL    Neutrophils Relative 80 (*) 43 - 77 %    Lymphocytes Relative 13  12 - 46 %    Monocytes Relative 7  3 - 12 %    Eosinophils Relative 0  0 - 5 %    Basophils Relative 0  0 - 1 %    Band Neutrophils 0  0 - 10 %    Metamyelocytes Relative 0      Myelocytes 0      Promyelocytes Absolute 0      Blasts 0      nRBC 0  0 /100 WBC    Neutro Abs 19.9 (*) 1.7 - 7.7 K/uL    Lymphs Abs 3.3  0.7 - 4.0 K/uL    Monocytes Absolute 1.8 (*) 0.1 - 1.0 K/uL    Eosinophils Absolute 0.0  0.0 - 0.7 K/uL  Basophils Absolute 0.0  0.0 - 0.1 K/uL    WBC Morphology MILD LEFT SHIFT (1-5% METAS, OCC MYELO, OCC BANDS)     COMPREHENSIVE METABOLIC PANEL     Status: Abnormal   Collection Time   12/15/12  1:23 PM      Component Value Range Comment   Sodium 133 (*) 135 - 145 mEq/L    Potassium 4.3  3.5 - 5.1 mEq/L    Chloride 96  96 - 112 mEq/L    CO2 23  19 - 32 mEq/L    Glucose, Bld 214 (*) 70 - 99 mg/dL    BUN 24 (*) 6 - 23 mg/dL    Creatinine, Ser 9.81 (*) 0.50 - 1.10 mg/dL    Calcium 9.8  8.4 - 19.1 mg/dL    Total Protein 8.0  6.0 - 8.3 g/dL    Albumin 3.1 (*) 3.5 - 5.2 g/dL    AST 21  0 - 37 U/L    ALT 20  0 - 35 U/L    Alkaline Phosphatase 92  39 - 117 U/L    Total Bilirubin 0.6  0.3 - 1.2 mg/dL    GFR calc non Af Amer 36 (*) >90 mL/min    GFR calc Af Amer 42 (*) >90 mL/min   LIPASE, BLOOD     Status: Normal   Collection Time   12/15/12  1:23 PM      Component Value Range Comment   Lipase 41  11 - 59 U/L   URINALYSIS, ROUTINE W REFLEX MICROSCOPIC     Status: Abnormal   Collection Time   12/15/12  1:36 PM      Component Value Range  Comment   Color, Urine ORANGE (*) YELLOW BIOCHEMICALS MAY BE AFFECTED BY COLOR   APPearance TURBID (*) CLEAR    Specific Gravity, Urine 1.028  1.005 - 1.030    pH 5.0  5.0 - 8.0    Glucose, UA NEGATIVE  NEGATIVE mg/dL    Hgb urine dipstick NEGATIVE  NEGATIVE    Bilirubin Urine MODERATE (*) NEGATIVE    Ketones, ur 15 (*) NEGATIVE mg/dL    Protein, ur 30 (*) NEGATIVE mg/dL    Urobilinogen, UA 4.0 (*) 0.0 - 1.0 mg/dL    Nitrite POSITIVE (*) NEGATIVE    Leukocytes, UA SMALL (*) NEGATIVE   URINE MICROSCOPIC-ADD ON     Status: Abnormal   Collection Time   12/15/12  1:36 PM      Component Value Range Comment   Squamous Epithelial / LPF MANY (*) RARE    WBC, UA 7-10  <3 WBC/hpf    RBC / HPF 0-2  <3 RBC/hpf    Bacteria, UA MANY (*) RARE    Casts GRANULAR CAST (*) NEGATIVE   PROTIME-INR     Status: Normal   Collection Time   12/15/12  8:22 PM      Component Value Range Comment   Prothrombin Time 15.0  11.6 - 15.2 seconds    INR 1.20  0.00 - 1.49    Ct Abdomen Pelvis W Contrast  12/15/2012  *RADIOLOGY REPORT*  Clinical Data: Left lower quadrant pain with nausea over past 6 days.  CT ABDOMEN AND PELVIS WITH CONTRAST  Technique:  Multidetector CT imaging of the abdomen and pelvis was performed following the standard protocol during bolus administration of intravenous contrast.  Contrast: 80mL OMNIPAQUE IOHEXOL 300 MG/ML  SOLN .  Comparison: 04/10/2011 plain film exam.  No comparison CT.  Findings:  The present examination do not include the patients entire abdomen secondary to patient's size.  The maximal field of view was utilized.  Large anterior abdominal wall hernia through which fat and small bowel traverses.  This is causing small bowel obstruction with perforated bowel with contained complex air fluid collection deep to the hernia extending into the lower abdomen and upper pelvis.  Small scarred left kidney.  Right kidney poorly functioning as on delayed imaging of contrast is not seen within the  collecting system.  Fatty infiltration of the liver without focal mass.  Post cholecystectomy.  No focal splenic, pancreatic or adrenal lesion.  No abdominal aortic aneurysm.  Minimal atelectatic changes lung bases.  Decompressed urinary bladder without gross abnormality.  Appearance of prior hysterectomy.  Degenerative changes thoracic and lumbar spine without bony destructive lesion.  IMPRESSION: The present examination do not include the patients entire abdomen secondary to patient's size.  The maximal field of view was utilized.  Large anterior abdominal wall hernia through which fat and small bowel traverses.  This is causing small bowel obstruction with perforated bowel with contained complex air fluid collection deep to the hernia extending into the lower abdomen and upper pelvis.  Small scarred left kidney.  Right kidney poorly functioning as on delayed imaging of contrast is not seen within the collecting system.  Fatty infiltration of the liver.  Critical Value/emergent results were called by telephone at the time of interpretation on 12/15/2012 at 7:58 p.m. to Felicie Morn physician's assistant, who verbally acknowledged these results.   Original Report Authenticated By: Lacy Duverney, M.D.     Review of Systems  All other systems reviewed and are negative.    Blood pressure 94/54, pulse 110, temperature 98.2 F (36.8 C), temperature source Oral, resp. rate 16, SpO2 97.00%. Physical Exam  Constitutional: She is oriented to person, place, and time. She appears well-developed and well-nourished. She appears distressed.       Obese   HENT:  Head: Normocephalic and atraumatic.  Right Ear: External ear normal.  Left Ear: External ear normal.  Eyes: Conjunctivae normal are normal. Pupils are equal, round, and reactive to light. Right eye exhibits no discharge. Left eye exhibits no discharge. No scleral icterus.  Neck: Normal range of motion. Neck supple. No thyromegaly present.    Cardiovascular: Regular rhythm, normal heart sounds and intact distal pulses.  Exam reveals no gallop and no friction rub.   No murmur heard.      Tachycardic   Respiratory: Effort normal and breath sounds normal. No respiratory distress (sl tachpneic). She has no wheezes. She has no rales. She exhibits no tenderness.  GI: Soft. She exhibits mass (fullness above incision ). She exhibits no distension. There is tenderness (supraumbilical region). There is no rebound and no guarding.  Neurological: She is alert and oriented to person, place, and time. Coordination normal.  Skin: Skin is warm and dry. No rash noted. She is not diaphoretic. No erythema. No pallor.  Psychiatric: She has a normal mood and affect. Her behavior is normal. Thought content normal.     Assessment/Plan Acute problems Strangulated hernia Perforated viscus Intraabdominal abscess and abdominal wall abscess Acute renal insufficiency SIRS  Chronic problems HTN DM Hyperlipidemia  Plan:  Exploratory laparotomy with probable bowel resection and primary repair of abdominal wall. IV fluids IV antibiotics Glucose control  Discussed surgical plan and risks including, but not limited to, bleeding, infection, damage to adjacent structures, anastamotic leak, hernia recurrence, possible ostomy, open wound with  need for wound care, evisceration, blood clot, heart or lung problems.    Arraya Buck 12/15/2012, 9:31 PM

## 2012-12-16 ENCOUNTER — Inpatient Hospital Stay (HOSPITAL_COMMUNITY): Payer: BC Managed Care – PPO

## 2012-12-16 DIAGNOSIS — J9600 Acute respiratory failure, unspecified whether with hypoxia or hypercapnia: Secondary | ICD-10-CM

## 2012-12-16 DIAGNOSIS — E119 Type 2 diabetes mellitus without complications: Secondary | ICD-10-CM

## 2012-12-16 DIAGNOSIS — I10 Essential (primary) hypertension: Secondary | ICD-10-CM

## 2012-12-16 DIAGNOSIS — K631 Perforation of intestine (nontraumatic): Secondary | ICD-10-CM

## 2012-12-16 DIAGNOSIS — E669 Obesity, unspecified: Secondary | ICD-10-CM

## 2012-12-16 DIAGNOSIS — K56609 Unspecified intestinal obstruction, unspecified as to partial versus complete obstruction: Secondary | ICD-10-CM

## 2012-12-16 DIAGNOSIS — K660 Peritoneal adhesions (postprocedural) (postinfection): Secondary | ICD-10-CM

## 2012-12-16 DIAGNOSIS — A419 Sepsis, unspecified organism: Secondary | ICD-10-CM

## 2012-12-16 DIAGNOSIS — R69 Illness, unspecified: Secondary | ICD-10-CM

## 2012-12-16 DIAGNOSIS — K469 Unspecified abdominal hernia without obstruction or gangrene: Secondary | ICD-10-CM

## 2012-12-16 LAB — POCT I-STAT 4, (NA,K, GLUC, HGB,HCT)
Glucose, Bld: 204 mg/dL — ABNORMAL HIGH (ref 70–99)
HCT: 33 % — ABNORMAL LOW (ref 36.0–46.0)

## 2012-12-16 LAB — URINE CULTURE: Colony Count: 50000

## 2012-12-16 LAB — POCT I-STAT 3, ART BLOOD GAS (G3+)
Acid-base deficit: 4 mmol/L — ABNORMAL HIGH (ref 0.0–2.0)
Bicarbonate: 19.9 mEq/L — ABNORMAL LOW (ref 20.0–24.0)
TCO2: 21 mmol/L (ref 0–100)
pCO2 arterial: 29.4 mmHg — ABNORMAL LOW (ref 35.0–45.0)
pCO2 arterial: 32.8 mmHg — ABNORMAL LOW (ref 35.0–45.0)
pH, Arterial: 7.435 (ref 7.350–7.450)
pH, Arterial: 7.383 (ref 7.350–7.450)
pO2, Arterial: 46 mmHg — ABNORMAL LOW (ref 80.0–100.0)

## 2012-12-16 LAB — POCT I-STAT 7, (LYTES, BLD GAS, ICA,H+H)
Acid-base deficit: 2 mmol/L (ref 0.0–2.0)
Bicarbonate: 24.1 mEq/L — ABNORMAL HIGH (ref 20.0–24.0)
Calcium, Ion: 1.12 mmol/L (ref 1.12–1.23)
Calcium, Ion: 1.11 mmol/L — ABNORMAL LOW (ref 1.12–1.23)
HCT: 29 % — ABNORMAL LOW (ref 36.0–46.0)
Hemoglobin: 9.9 g/dL — ABNORMAL LOW (ref 12.0–15.0)
Hemoglobin: 9.9 g/dL — ABNORMAL LOW (ref 12.0–15.0)
O2 Saturation: 93 %
Potassium: 3.6 mEq/L (ref 3.5–5.1)
Sodium: 135 mEq/L (ref 135–145)
TCO2: 18 mmol/L (ref 0–100)
pCO2 arterial: 32.9 mmHg — ABNORMAL LOW (ref 35.0–45.0)
pH, Arterial: 7.331 — ABNORMAL LOW (ref 7.350–7.450)
pH, Arterial: 7.35 (ref 7.350–7.450)
pO2, Arterial: 69 mmHg — ABNORMAL LOW (ref 80.0–100.0)

## 2012-12-16 LAB — BLOOD GAS, VENOUS
Bicarbonate: 19.7 mEq/L — ABNORMAL LOW (ref 20.0–24.0)
TCO2: 21 mmol/L (ref 0–100)
pCO2, Ven: 44 mmHg — ABNORMAL LOW (ref 45.0–50.0)
pH, Ven: 7.273 (ref 7.250–7.300)
pO2, Ven: 37.5 mmHg (ref 30.0–45.0)

## 2012-12-16 LAB — RENAL FUNCTION PANEL
Albumin: 2.4 g/dL — ABNORMAL LOW (ref 3.5–5.2)
BUN: 25 mg/dL — ABNORMAL HIGH (ref 6–23)
Calcium: 8.1 mg/dL — ABNORMAL LOW (ref 8.4–10.5)
Creatinine, Ser: 1.95 mg/dL — ABNORMAL HIGH (ref 0.50–1.10)
Phosphorus: 2.9 mg/dL (ref 2.3–4.6)
Potassium: 3.8 mEq/L (ref 3.5–5.1)

## 2012-12-16 LAB — CARBOXYHEMOGLOBIN
Carboxyhemoglobin: 0.5 % (ref 0.5–1.5)
Methemoglobin: 0.9 % (ref 0.0–1.5)
O2 Saturation: 63.5 %
Total hemoglobin: 10.5 g/dL — ABNORMAL LOW (ref 12.0–16.0)
Total hemoglobin: 12.7 g/dL (ref 12.0–16.0)

## 2012-12-16 LAB — CBC
HCT: 30.7 % — ABNORMAL LOW (ref 36.0–46.0)
HCT: 31.2 % — ABNORMAL LOW (ref 36.0–46.0)
Hemoglobin: 10.7 g/dL — ABNORMAL LOW (ref 12.0–15.0)
MCV: 82.7 fL (ref 78.0–100.0)
Platelets: 306 10*3/uL (ref 150–400)
RBC: 3.71 MIL/uL — ABNORMAL LOW (ref 3.87–5.11)
RBC: 3.79 MIL/uL — ABNORMAL LOW (ref 3.87–5.11)
WBC: 19.3 10*3/uL — ABNORMAL HIGH (ref 4.0–10.5)
WBC: 20.4 10*3/uL — ABNORMAL HIGH (ref 4.0–10.5)

## 2012-12-16 LAB — GLUCOSE, CAPILLARY
Glucose-Capillary: 234 mg/dL — ABNORMAL HIGH (ref 70–99)
Glucose-Capillary: 297 mg/dL — ABNORMAL HIGH (ref 70–99)

## 2012-12-16 LAB — BASIC METABOLIC PANEL
CO2: 22 mEq/L (ref 19–32)
Chloride: 97 mEq/L (ref 96–112)
Creatinine, Ser: 2.51 mg/dL — ABNORMAL HIGH (ref 0.50–1.10)
Potassium: 3.9 mEq/L (ref 3.5–5.1)

## 2012-12-16 LAB — PROCALCITONIN: Procalcitonin: 52.08 ng/mL

## 2012-12-16 LAB — POCT I-STAT GLUCOSE: Glucose, Bld: 237 mg/dL — ABNORMAL HIGH (ref 70–99)

## 2012-12-16 MED ORDER — DOBUTAMINE IN D5W 4-5 MG/ML-% IV SOLN
2.5000 ug/kg/min | INTRAVENOUS | Status: DC | PRN
  Administered 2012-12-17: 2.5 ug/kg/min via INTRAVENOUS
  Filled 2012-12-16: qty 250

## 2012-12-16 MED ORDER — HYDROCORTISONE SOD SUCCINATE 100 MG IJ SOLR
50.0000 mg | Freq: Four times a day (QID) | INTRAMUSCULAR | Status: DC
  Administered 2012-12-16 – 2012-12-18 (×8): 50 mg via INTRAVENOUS
  Filled 2012-12-16 (×13): qty 1

## 2012-12-16 MED ORDER — VASOPRESSIN 20 UNIT/ML IJ SOLN
0.0300 [IU]/min | INTRAVENOUS | Status: DC | PRN
  Filled 2012-12-16: qty 2.5

## 2012-12-16 MED ORDER — CHLORHEXIDINE GLUCONATE 0.12 % MT SOLN
15.0000 mL | Freq: Two times a day (BID) | OROMUCOSAL | Status: DC
  Administered 2012-12-16 – 2012-12-26 (×21): 15 mL via OROMUCOSAL
  Filled 2012-12-16 (×20): qty 15

## 2012-12-16 MED ORDER — SODIUM CHLORIDE 0.9 % IV SOLN
INTRAVENOUS | Status: DC
  Administered 2012-12-16: 2 [IU]/h via INTRAVENOUS
  Filled 2012-12-16 (×2): qty 1

## 2012-12-16 MED ORDER — PRISMASOL BGK 4/2.5 32-4-2.5 MEQ/L IV SOLN
INTRAVENOUS | Status: DC
  Administered 2012-12-17: 07:00:00 via INTRAVENOUS_CENTRAL
  Filled 2012-12-16 (×2): qty 5000

## 2012-12-16 MED ORDER — SODIUM CHLORIDE 0.9 % IV SOLN
25.0000 ug/h | INTRAVENOUS | Status: DC
  Administered 2012-12-16: 100 ug/h via INTRAVENOUS
  Administered 2012-12-17: 175 ug/h via INTRAVENOUS
  Administered 2012-12-18: 250 ug/h via INTRAVENOUS
  Administered 2012-12-18: 225 ug/h via INTRAVENOUS
  Administered 2012-12-19 – 2012-12-20 (×5): 250 ug/h via INTRAVENOUS
  Administered 2012-12-21: 200 ug/h via INTRAVENOUS
  Administered 2012-12-22 – 2012-12-23 (×2): 225 ug/h via INTRAVENOUS
  Administered 2012-12-23: 200 ug/h via INTRAVENOUS
  Administered 2012-12-24 – 2012-12-25 (×2): 375 ug/h via INTRAVENOUS
  Filled 2012-12-16 (×20): qty 50

## 2012-12-16 MED ORDER — FENTANYL BOLUS VIA INFUSION
25.0000 ug | Freq: Four times a day (QID) | INTRAVENOUS | Status: DC | PRN
  Administered 2012-12-17: 175 ug via INTRAVENOUS
  Administered 2012-12-22: 100 ug via INTRAVENOUS
  Filled 2012-12-16: qty 100

## 2012-12-16 MED ORDER — INSULIN ASPART 100 UNIT/ML ~~LOC~~ SOLN
0.0000 [IU] | SUBCUTANEOUS | Status: DC
  Administered 2012-12-16: 7 [IU] via SUBCUTANEOUS
  Administered 2012-12-16: 11 [IU] via SUBCUTANEOUS

## 2012-12-16 MED ORDER — MIDAZOLAM BOLUS VIA INFUSION
1.0000 mg | INTRAVENOUS | Status: DC | PRN
  Filled 2012-12-16: qty 2

## 2012-12-16 MED ORDER — HEPARIN SODIUM (PORCINE) 5000 UNIT/ML IJ SOLN
5000.0000 [IU] | Freq: Three times a day (TID) | INTRAMUSCULAR | Status: DC
  Administered 2012-12-16 – 2012-12-30 (×41): 5000 [IU] via SUBCUTANEOUS
  Filled 2012-12-16 (×47): qty 1

## 2012-12-16 MED ORDER — DOPAMINE-DEXTROSE 3.2-5 MG/ML-% IV SOLN: 2.0000 ug/kg/min | INTRAVENOUS | Status: DC

## 2012-12-16 MED ORDER — DOBUTAMINE IN D5W 4-5 MG/ML-% IV SOLN
2.5000 ug/kg/min | INTRAVENOUS | Status: DC
  Administered 2012-12-16: 2.5 ug/kg/min via INTRAVENOUS
  Filled 2012-12-16 (×2): qty 250

## 2012-12-16 MED ORDER — ACETAMINOPHEN 325 MG PO TABS: 650.0000 mg | ORAL_TABLET | Freq: Four times a day (QID) | ORAL | Status: DC | PRN

## 2012-12-16 MED ORDER — ACETAMINOPHEN 650 MG RE SUPP: 650.0000 mg | Freq: Four times a day (QID) | RECTAL | Status: DC | PRN

## 2012-12-16 MED ORDER — PANTOPRAZOLE SODIUM 40 MG IV SOLR
40.0000 mg | Freq: Every day | INTRAVENOUS | Status: DC
  Administered 2012-12-16 – 2012-12-27 (×12): 40 mg via INTRAVENOUS
  Filled 2012-12-16 (×15): qty 40

## 2012-12-16 MED ORDER — BIOTENE DRY MOUTH MT LIQD
15.0000 mL | Freq: Four times a day (QID) | OROMUCOSAL | Status: DC
  Administered 2012-12-16 – 2012-12-26 (×39): 15 mL via OROMUCOSAL

## 2012-12-16 MED ORDER — SODIUM CHLORIDE 0.9 % IV SOLN
100.0000 mg | Freq: Every day | INTRAVENOUS | Status: AC
  Administered 2012-12-16: 100 mg via INTRAVENOUS
  Filled 2012-12-16: qty 100

## 2012-12-16 MED ORDER — SODIUM CHLORIDE 0.9 % FOR CRRT
INTRAVENOUS_CENTRAL | Status: DC | PRN
  Filled 2012-12-16: qty 1000

## 2012-12-16 MED ORDER — NOREPINEPHRINE BITARTRATE 1 MG/ML IJ SOLN
2.0000 ug/min | INTRAVENOUS | Status: DC
  Administered 2012-12-16: 40 ug/min via INTRAVENOUS
  Administered 2012-12-16: 5 ug/min via INTRAVENOUS
  Filled 2012-12-16 (×3): qty 16

## 2012-12-16 MED ORDER — ERTAPENEM SODIUM 1 G IJ SOLR
1.0000 g | INTRAMUSCULAR | Status: DC
  Filled 2012-12-16: qty 1

## 2012-12-16 MED ORDER — SODIUM CHLORIDE 0.9 % IV BOLUS (SEPSIS)
500.0000 mL | Freq: Once | INTRAVENOUS | Status: AC
  Administered 2012-12-16: 500 mL via INTRAVENOUS

## 2012-12-16 MED ORDER — VASOPRESSIN 20 UNIT/ML IJ SOLN
0.0300 [IU]/min | INTRAMUSCULAR | Status: DC
  Administered 2012-12-16: 0.03 [IU]/min via INTRAVENOUS
  Filled 2012-12-16: qty 2.5

## 2012-12-16 MED ORDER — PROPOFOL INFUSION 10 MG/ML OPTIME
INTRAVENOUS | Status: DC | PRN
  Administered 2012-12-16: 25 ug/kg/min via INTRAVENOUS

## 2012-12-16 MED ORDER — PRISMASOL BGK 4/2.5 32-4-2.5 MEQ/L IV SOLN
INTRAVENOUS | Status: DC
  Filled 2012-12-16 (×3): qty 5000

## 2012-12-16 MED ORDER — SODIUM CHLORIDE 0.9 % IV SOLN: INTRAVENOUS | Status: DC

## 2012-12-16 MED ORDER — VANCOMYCIN HCL 10 G IV SOLR
2500.0000 mg | Freq: Once | INTRAVENOUS | Status: AC
  Administered 2012-12-16: 2500 mg via INTRAVENOUS
  Filled 2012-12-16: qty 2500

## 2012-12-16 MED ORDER — LACTATED RINGERS IV SOLN
INTRAVENOUS | Status: DC | PRN
  Administered 2012-12-16 (×2): via INTRAVENOUS

## 2012-12-16 MED ORDER — MICAFUNGIN SODIUM 50 MG IV SOLR
100.0000 mg | Freq: Every day | INTRAVENOUS | Status: AC
  Administered 2012-12-17 – 2012-12-20 (×4): 100 mg via INTRAVENOUS
  Filled 2012-12-16 (×4): qty 100

## 2012-12-16 MED ORDER — PRISMASOL BGK 4/2.5 32-4-2.5 MEQ/L IV SOLN
INTRAVENOUS | Status: DC
  Administered 2012-12-16 – 2012-12-17 (×3): via INTRAVENOUS_CENTRAL
  Filled 2012-12-16 (×12): qty 5000

## 2012-12-16 MED ORDER — VANCOMYCIN HCL 10 G IV SOLR: 2000.0000 mg | INTRAVENOUS | Status: DC

## 2012-12-16 MED ORDER — SODIUM CHLORIDE 0.9 % IV SOLN
1.0000 mg/h | INTRAVENOUS | Status: DC
  Administered 2012-12-16 – 2012-12-17 (×2): 3 mg/h via INTRAVENOUS
  Administered 2012-12-17: 5 mg/h via INTRAVENOUS
  Administered 2012-12-18 – 2012-12-19 (×6): 6 mg/h via INTRAVENOUS
  Administered 2012-12-20: 4 mg/h via INTRAVENOUS
  Administered 2012-12-20: 6 mg/h via INTRAVENOUS
  Administered 2012-12-21 – 2012-12-23 (×6): 4 mg/h via INTRAVENOUS
  Administered 2012-12-24: 2 mg/h via INTRAVENOUS
  Administered 2012-12-25: 3 mg/h via INTRAVENOUS
  Filled 2012-12-16 (×21): qty 10

## 2012-12-16 MED ORDER — DOPAMINE-DEXTROSE 1.6-5 MG/ML-% IV SOLN
INTRAVENOUS | Status: DC | PRN
  Administered 2012-12-16: 10 ug/kg/min via INTRAVENOUS

## 2012-12-16 MED ORDER — SODIUM CHLORIDE 0.9 % IV BOLUS (SEPSIS): 500.0000 mL | INTRAVENOUS | Status: DC | PRN

## 2012-12-16 MED ORDER — SODIUM CHLORIDE 0.9 % IV SOLN
250.0000 mg | Freq: Four times a day (QID) | INTRAVENOUS | Status: DC
  Administered 2012-12-16 – 2012-12-18 (×9): 250 mg via INTRAVENOUS
  Filled 2012-12-16 (×10): qty 250

## 2012-12-16 MED ORDER — NOREPINEPHRINE BITARTRATE 1 MG/ML IJ SOLN
2.0000 ug/min | INTRAVENOUS | Status: DC | PRN
  Filled 2012-12-16: qty 4

## 2012-12-16 MED ORDER — SODIUM BICARBONATE 8.4 % IV SOLN
INTRAVENOUS | Status: DC | PRN
  Administered 2012-12-16 (×2): 50 meq via INTRAVENOUS

## 2012-12-16 MED ORDER — FUROSEMIDE 10 MG/ML IJ SOLN
INTRAMUSCULAR | Status: DC | PRN
  Administered 2012-12-16 (×2): 10 mg via INTRAMUSCULAR

## 2012-12-16 MED ORDER — HEPARIN SODIUM (PORCINE) 1000 UNIT/ML DIALYSIS
1000.0000 [IU] | INTRAMUSCULAR | Status: DC | PRN
  Administered 2012-12-17: 3000 [IU] via INTRAVENOUS_CENTRAL
  Filled 2012-12-16: qty 3
  Filled 2012-12-16: qty 1
  Filled 2012-12-16: qty 2
  Filled 2012-12-16: qty 6

## 2012-12-16 NOTE — Progress Notes (Addendum)
1 Day Post-Op  Subjective: Sedated on the ventilator. Does open eyes to voice and seems aware of surroundings.  She is on moderate to moderately high levels of levophed  and vasopressin for blood pressure support. She is profoundly oliguric. She is requiring FiO2 80% and PEEP of 16 to maintain reasonable oxygen saturation. No evidence of bleeding.  ABG  PH 7.38, pCO2 32,pO2  60 on above settings. Postop hemoglobin 10.4, WBC 19,300. Creatinine 2.51, BUN 28, potassium 3.9. Glucose 234.  Active management by critical care medicine is greatly appreciated.  Objective: Vital signs in last 24 hours: Temp:  [98.2 F (36.8 C)-100.8 F (38.2 C)] 98.2 F (36.8 C) (02/04 0400) Pulse Rate:  [99-110] 99  (02/04 0545) Resp:  [16-33] 27  (02/04 0545) BP: (78-103)/(26-64) 80/64 mmHg (02/04 0500) SpO2:  [90 %-99 %] 96 % (02/04 0545) Arterial Line BP: (81-120)/(42-63) 81/58 mmHg (02/04 0545) FiO2 (%):  [80 %-100 %] 80 % (02/04 0524) Weight:  [348 lb 1.7 oz (157.9 kg)] 348 lb 1.7 oz (157.9 kg) (02/04 0100)    Intake/Output from previous day: 02/03 0701 - 02/04 0700 In: 5374.3 [I.V.:4874.3; IV Piggyback:500] Out: 227 [Urine:177; Drains:50] Intake/Output this shift: Total I/O In: 5374.3 [I.V.:4874.3; IV Piggyback:500] Out: 227 [Urine:177; Drains:50]  General appearance: morbidly obese. Sedated. Opens eyes to voice. nods head to command Resp: coarse breath sounds bilaterally GI: morbidly obese. Appropriate incisional tenderness.  Fairly soft elsewhere. VAC in place with watery serosanguineous drainage. No enteric drainage.  Lab Results:  Results for orders placed during the hospital encounter of 12/15/12 (from the past 24 hour(s))  CBC WITH DIFFERENTIAL     Status: Abnormal   Collection Time   12/15/12  1:23 PM      Component Value Range   WBC 25.0 (*) 4.0 - 10.5 K/uL   RBC 4.24  3.87 - 5.11 MIL/uL   Hemoglobin 11.8 (*) 12.0 - 15.0 g/dL   HCT 45.4 (*) 09.8 - 11.9 %   MCV 82.8  78.0 - 100.0 fL    MCH 27.8  26.0 - 34.0 pg   MCHC 33.6  30.0 - 36.0 g/dL   RDW 14.7  82.9 - 56.2 %   Platelets 394  150 - 400 K/uL   Neutrophils Relative 80 (*) 43 - 77 %   Lymphocytes Relative 13  12 - 46 %   Monocytes Relative 7  3 - 12 %   Eosinophils Relative 0  0 - 5 %   Basophils Relative 0  0 - 1 %   Band Neutrophils 0  0 - 10 %   Metamyelocytes Relative 0     Myelocytes 0     Promyelocytes Absolute 0     Blasts 0     nRBC 0  0 /100 WBC   Neutro Abs 19.9 (*) 1.7 - 7.7 K/uL   Lymphs Abs 3.3  0.7 - 4.0 K/uL   Monocytes Absolute 1.8 (*) 0.1 - 1.0 K/uL   Eosinophils Absolute 0.0  0.0 - 0.7 K/uL   Basophils Absolute 0.0  0.0 - 0.1 K/uL   WBC Morphology MILD LEFT SHIFT (1-5% METAS, OCC MYELO, OCC BANDS)    COMPREHENSIVE METABOLIC PANEL     Status: Abnormal   Collection Time   12/15/12  1:23 PM      Component Value Range   Sodium 133 (*) 135 - 145 mEq/L   Potassium 4.3  3.5 - 5.1 mEq/L   Chloride 96  96 - 112 mEq/L  CO2 23  19 - 32 mEq/L   Glucose, Bld 214 (*) 70 - 99 mg/dL   BUN 24 (*) 6 - 23 mg/dL   Creatinine, Ser 1.61 (*) 0.50 - 1.10 mg/dL   Calcium 9.8  8.4 - 09.6 mg/dL   Total Protein 8.0  6.0 - 8.3 g/dL   Albumin 3.1 (*) 3.5 - 5.2 g/dL   AST 21  0 - 37 U/L   ALT 20  0 - 35 U/L   Alkaline Phosphatase 92  39 - 117 U/L   Total Bilirubin 0.6  0.3 - 1.2 mg/dL   GFR calc non Af Amer 36 (*) >90 mL/min   GFR calc Af Amer 42 (*) >90 mL/min  LIPASE, BLOOD     Status: Normal   Collection Time   12/15/12  1:23 PM      Component Value Range   Lipase 41  11 - 59 U/L  URINALYSIS, ROUTINE W REFLEX MICROSCOPIC     Status: Abnormal   Collection Time   12/15/12  1:36 PM      Component Value Range   Color, Urine ORANGE (*) YELLOW   APPearance TURBID (*) CLEAR   Specific Gravity, Urine 1.028  1.005 - 1.030   pH 5.0  5.0 - 8.0   Glucose, UA NEGATIVE  NEGATIVE mg/dL   Hgb urine dipstick NEGATIVE  NEGATIVE   Bilirubin Urine MODERATE (*) NEGATIVE   Ketones, ur 15 (*) NEGATIVE mg/dL   Protein, ur  30 (*) NEGATIVE mg/dL   Urobilinogen, UA 4.0 (*) 0.0 - 1.0 mg/dL   Nitrite POSITIVE (*) NEGATIVE   Leukocytes, UA SMALL (*) NEGATIVE  URINE MICROSCOPIC-ADD ON     Status: Abnormal   Collection Time   12/15/12  1:36 PM      Component Value Range   Squamous Epithelial / LPF MANY (*) RARE   WBC, UA 7-10  <3 WBC/hpf   RBC / HPF 0-2  <3 RBC/hpf   Bacteria, UA MANY (*) RARE   Casts GRANULAR CAST (*) NEGATIVE  PROTIME-INR     Status: Normal   Collection Time   12/15/12  8:22 PM      Component Value Range   Prothrombin Time 15.0  11.6 - 15.2 seconds   INR 1.20  0.00 - 1.49  TYPE AND SCREEN     Status: Normal   Collection Time   12/15/12  8:39 PM      Component Value Range   ABO/RH(D) B POS     Antibody Screen NEG     Sample Expiration 12/18/2012    ABO/RH     Status: Normal (Preliminary result)   Collection Time   12/15/12  8:39 PM      Component Value Range   ABO/RH(D) B POS    POCT I-STAT 4, (NA,K, GLUC, HGB,HCT)     Status: Abnormal   Collection Time   12/16/12 12:06 AM      Component Value Range   Sodium 133 (*) 135 - 145 mEq/L   Potassium 4.1  3.5 - 5.1 mEq/L   Glucose, Bld 204 (*) 70 - 99 mg/dL   HCT 04.5 (*) 40.9 - 81.1 %   Hemoglobin 11.2 (*) 12.0 - 15.0 g/dL  POCT I-STAT 7, (LYTES, BLD GAS, ICA,H+H)     Status: Abnormal   Collection Time   12/16/12  1:19 AM      Component Value Range   pH, Arterial 7.331 (*) 7.350 - 7.450   pCO2 arterial  32.9 (*) 35.0 - 45.0 mmHg   pO2, Arterial 69.0 (*) 80.0 - 100.0 mmHg   Bicarbonate 17.4 (*) 20.0 - 24.0 mEq/L   TCO2 18  0 - 100 mmol/L   O2 Saturation 93.0     Acid-base deficit 8.0 (*) 0.0 - 2.0 mmol/L   Sodium 131 (*) 135 - 145 mEq/L   Potassium 3.9  3.5 - 5.1 mEq/L   Calcium, Ion 1.12  1.12 - 1.23 mmol/L   HCT 29.0 (*) 36.0 - 46.0 %   Hemoglobin 9.9 (*) 12.0 - 15.0 g/dL   Sample type ARTERIAL    POCT I-STAT 7, (LYTES, BLD GAS, ICA,H+H)     Status: Abnormal   Collection Time   12/16/12  1:33 AM      Component Value Range   pH,  Arterial 7.350  7.350 - 7.450   pCO2 arterial 43.7  35.0 - 45.0 mmHg   pO2, Arterial 73.0 (*) 80.0 - 100.0 mmHg   Bicarbonate 24.1 (*) 20.0 - 24.0 mEq/L   TCO2 25  0 - 100 mmol/L   O2 Saturation 94.0     Acid-base deficit 2.0  0.0 - 2.0 mmol/L   Sodium 135  135 - 145 mEq/L   Potassium 3.6  3.5 - 5.1 mEq/L   Calcium, Ion 1.11 (*) 1.12 - 1.23 mmol/L   HCT 29.0 (*) 36.0 - 46.0 %   Hemoglobin 9.9 (*) 12.0 - 15.0 g/dL   Sample type ARTERIAL    POCT I-STAT GLUCOSE     Status: Abnormal   Collection Time   12/16/12  1:38 AM      Component Value Range   Operator id [id number]     Glucose, Bld 237 (*) 70 - 99 mg/dL  MRSA PCR SCREENING     Status: Normal   Collection Time   12/16/12  2:51 AM      Component Value Range   MRSA by PCR NEGATIVE  NEGATIVE  CBC     Status: Abnormal   Collection Time   12/16/12  3:00 AM      Component Value Range   WBC 19.3 (*) 4.0 - 10.5 K/uL   RBC 3.71 (*) 3.87 - 5.11 MIL/uL   Hemoglobin 10.4 (*) 12.0 - 15.0 g/dL   HCT 44.0 (*) 10.2 - 72.5 %   MCV 82.7  78.0 - 100.0 fL   MCH 28.0  26.0 - 34.0 pg   MCHC 33.9  30.0 - 36.0 g/dL   RDW 36.6  44.0 - 34.7 %   Platelets 306  150 - 400 K/uL  BASIC METABOLIC PANEL     Status: Abnormal   Collection Time   12/16/12  3:00 AM      Component Value Range   Sodium 133 (*) 135 - 145 mEq/L   Potassium 3.9  3.5 - 5.1 mEq/L   Chloride 97  96 - 112 mEq/L   CO2 22  19 - 32 mEq/L   Glucose, Bld 258 (*) 70 - 99 mg/dL   BUN 28 (*) 6 - 23 mg/dL   Creatinine, Ser 4.25 (*) 0.50 - 1.10 mg/dL   Calcium 8.3 (*) 8.4 - 10.5 mg/dL   GFR calc non Af Amer 22 (*) >90 mL/min   GFR calc Af Amer 25 (*) >90 mL/min  TROPONIN I     Status: Normal   Collection Time   12/16/12  3:00 AM      Component Value Range   Troponin I <0.30  <0.30 ng/mL  GLUCOSE, CAPILLARY     Status: Abnormal   Collection Time   12/16/12  3:43 AM      Component Value Range   Glucose-Capillary 234 (*) 70 - 99 mg/dL  POCT I-STAT 3, BLOOD GAS (G3+)     Status: Abnormal    Collection Time   12/16/12  3:53 AM      Component Value Range   pH, Arterial 7.435  7.350 - 7.450   pCO2 arterial 29.4 (*) 35.0 - 45.0 mmHg   pO2, Arterial 46.0 (*) 80.0 - 100.0 mmHg   Bicarbonate 19.9 (*) 20.0 - 24.0 mEq/L   TCO2 21  0 - 100 mmol/L   O2 Saturation 85.0     Acid-base deficit 4.0 (*) 0.0 - 2.0 mmol/L   Patient temperature 97.6 F     Sample type ARTERIAL    POCT I-STAT 3, BLOOD GAS (G3+)     Status: Abnormal   Collection Time   12/16/12  5:05 AM      Component Value Range   pH, Arterial 7.383  7.350 - 7.450   pCO2 arterial 32.8 (*) 35.0 - 45.0 mmHg   pO2, Arterial 63.0 (*) 80.0 - 100.0 mmHg   Bicarbonate 19.6 (*) 20.0 - 24.0 mEq/L   TCO2 21  0 - 100 mmol/L   O2 Saturation 92.0     Acid-base deficit 5.0 (*) 0.0 - 2.0 mmol/L   Patient temperature 97.8 F     Sample type ARTERIAL       Studies/Results: @[id number] @     . antiseptic oral rinse  15 mL Mouth Rinse QID  . chlorhexidine  15 mL Mouth Rinse BID  . ertapenem (INVANZ) IV  1 g Intravenous Q24H  . heparin  5,000 Units Subcutaneous Q8H  . insulin aspart  0-20 Units Subcutaneous Q4H  . pantoprazole (PROTONIX) IV  40 mg Intravenous Daily     Assessment/Plan: s/p Procedure(s): EXPLORATORY LAPAROTOMY  5 hours postop, laparotomy, extensive lysis of adhesions, small bowel resection, placement of negative pressure wound dressing for strangulated, , perforated ventral incisional hernia.  Surgically, the wound looks okay. Abdomen is soft and doubt abdominal compartment syndrome.  She will need to return to the operating room in 24 hours, ideally, for change of abdominal VAC and attempt at fascial closure versus simply  replace VAC.  Addendum: (9:00 am) I had a phone conversation with the daughter and then with the mother. I told them of the seriousness of their family members condition. I told her that she would need to be returned to the operating room in 24-48 hours to change her dressing and possibly close the  fascia. I answered all their questions. They seem to understand these issues well and agree with the plan.  Septic shock, requiring moderately high pressor support. On Invanz and Zosyn.  VDRF, Required high FiO2 and PEEP due to body habitus. At risk for ARDS.  Acute oliguric renal failure due to septic shock. CCM plans renal consult this morning. Suspect will need CVVH.  DM - see insulin orders.  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    LOS: 1 day    Latifah Padin M. Derrell Lolling, M.D., [facility] General and Minimally invasive Surgery Breast and Colorectal Surgery Office:   [phone] Pager:   [phone]  12/16/2012  . .prob

## 2012-12-16 NOTE — Progress Notes (Signed)
INITIAL NUTRITION ASSESSMENT  DOCUMENTATION CODES Per approved criteria  -Morbid Obesity   INTERVENTION:  Recommend initiating nutrition support (EN vs TPN) within 24-48 hours  RD to follow for nutrition care plan  NUTRITION DIAGNOSIS: Inadequate oral intake related to altered GI function as evidenced by NPO status  Goal: Initiate nutrition support within next 24-48 hours if prolonged intubation expected  Monitor:  Nutrition support initiation, respiratory status, weight, labs, I/O's  Reason for Assessment: VDRF  47 y.o. female  Admitting Dx: abdominal pain  ASSESSMENT:  Patient presented with approximately 6 days of abdominal pain; developed nausea and vomiting and was not able to keep anything down; CT scan demonstrated large anterior abdominal wall hernia causing small bowel obstruction with perforated bowel ---> taken to OR for emergent surgery.  Patient s/p procedures 2/4: EXPLORATORY LAPAROTOMY LYSIS OF ADHESIONS SMALL BOWEL RESECTION PLACEMENT OF ABDOMINAL WOUND VAC  Patient is currently intubated on ventilator support ---> NGT to LIS MV: 15.4 Temp: 37.3  Central venous catheter inserted this AM for CVVHD initiation.  Per Surgery, will likely need to return to OR within 24-48 hours to change dressings and possibly close fascia.  Height: Ht Readings from Last 1 Encounters:  12/16/12 5\' 7"  (1.702 m)    Weight: Wt Readings from Last 1 Encounters:  12/16/12 348 lb 1.7 oz (157.9 kg)    Ideal Body Weight: 135 lb  % Ideal Body Weight: 257%  Wt Readings from Last 10 Encounters:  12/16/12 348 lb 1.7 oz (157.9 kg)  12/16/12 348 lb 1.7 oz (157.9 kg)  11/14/12 348 lb (157.852 kg)  06/03/12 350 lb 1.6 oz (158.804 kg)  02/06/12 344 lb (156.037 kg)  11/14/11 336 lb (152.409 kg)  09/28/11 344 lb (156.037 kg)  05/14/11 343 lb (155.584 kg)  03/22/11 347 lb 4.8 oz (157.534 kg)  09/26/10 352 lb (159.666 kg)    Usual Body Weight: 350 lbs  % Usual Body Weight:  99%  BMI:  Body mass index is 54.52 kg/(m^2).  Estimated Nutritional Needs: Kcal: 2600 Protein: 125-135 gm Fluid: 2.6-2.7 L  Skin: abdominal wound  Diet Order: NPO  EDUCATION NEEDS: -No education needs identified at this time   Intake/Output Summary (Last 24 hours) at 12/16/12 1338 Last data filed at 12/16/12 1200  Gross per 24 hour  Intake 6857.8 ml  Output    507 ml  Net 6350.8 ml    Labs:   Lab 12/16/12 0300 12/16/12 0138 12/16/12 0133 12/16/12 0119 12/16/12 0006 12/15/12 1323  NA 133* -- 135 131* -- --  K 3.9 -- 3.6 3.9 -- --  CL 97 -- -- -- -- 96  CO2 22 -- -- -- -- 23  BUN 28* -- -- -- -- 24*  CREATININE 2.51* -- -- -- -- 1.64*  CALCIUM 8.3* -- -- -- -- 9.8  MG -- -- -- -- -- --  PHOS -- -- -- -- -- --  GLUCOSE 258* 237* -- -- 204* --    CBG (last 3)   Basename 12/16/12 1139 12/16/12 0753 12/16/12 0343  GLUCAP 297* 289* 234*    Scheduled Meds:   . antiseptic oral rinse  15 mL Mouth Rinse QID  . chlorhexidine  15 mL Mouth Rinse BID  . heparin  5,000 Units Subcutaneous Q8H  . hydrocortisone sodium succinate  50 mg Intravenous Q6H  . imipenem-cilastatin  250 mg Intravenous Q6H  . micafungin (MYCAMINE) IV  100 mg Intravenous Daily  . pantoprazole (PROTONIX) IV  40 mg Intravenous Daily  .  vancomycin  2,000 mg Intravenous Q48H    Continuous Infusions:   . sodium chloride    . sodium chloride    . fentaNYL infusion INTRAVENOUS 175 mcg/hr (12/16/12 0600)  . insulin (NOVOLIN-R) infusion    . midazolam (VERSED) infusion 5 mg/hr (12/16/12 0400)  . norepinephrine (LEVOPHED) Adult infusion 40 mcg/min (12/16/12 0500)  . dialysis replacement fluid (prismasate)    . dialysis replacement fluid (prismasate)    . dialysate (PRISMASATE)    . vasopressin (PITRESSIN) infusion - *FOR SHOCK* 0.03 Units/min (12/16/12 0406)  . vasopressin (PITRESSIN) infusion - *FOR SHOCK*      Past Medical History  Diagnosis Date  . Hypertension   . Diabetes mellitus without  complication   . Chronic kidney disease   . H/O hiatal hernia     Past Surgical History  Procedure Date  . Cholecystectomy   . Appendectomy   . Abdominal hysterectomy     Maureen Chatters, RD, LDN Pager #: [phone] After-Hours Pager #: [phone]

## 2012-12-16 NOTE — Progress Notes (Signed)
I discussed with Dr Dr Berline Chough.  I agree with their plans documented in their progress note for today.

## 2012-12-16 NOTE — Procedures (Signed)
Central Venous Catheter Insertion Procedure Note - hd cath Erin Schultz [id number] 08-Apr-1966  Procedure: Insertion of Central Venous Catheter Indications: cvvhd  Procedure Details Consent: Risks of procedure as well as the alternatives and risks of each were explained to the (patient/caregiver).  Consent for procedure obtained. Time Out: Verified patient identification, verified procedure, site/side was marked, verified correct patient position, special equipment/implants available, medications/allergies/relevent history reviewed, required imaging and test results available.  Performed  Maximum sterile technique was used including antiseptics, cap, gloves, gown, hand hygiene, mask and sheet. Skin prep: Chlorhexidine; local anesthetic administered A antimicrobial bonded/coated triple lumen catheter was placed in the left internal jugular vein using the Seldinger technique.  Evaluation Blood flow good Complications: No apparent complications Patient did tolerate procedure well. Chest X-ray ordered to verify placement.  CXR: pending.  Erin Schultz 12/16/2012, 10:42 AM  [place] gud[place]  Consent mom  Erin Schultz. Tyson Alias, MD, FACP Pgr: [phone] [facility]

## 2012-12-16 NOTE — Anesthesia Postprocedure Evaluation (Signed)
Anesthesia Post Note  Patient: Erin Schultz  Procedure(s) Performed: Procedure(s) (LRB): EXPLORATORY LAPAROTOMY (N/A)  Anesthesia type: General  Patient location: ICU  Post pain: Pain level controlled  Post assessment: Post-op Vital signs reviewed  Last Vitals:  Filed Vitals:   12/16/12 0300  BP: 81/36  Pulse: 101  Temp:   Resp: 26    Post vital signs: stable  Level of consciousness: Patient remains intubated per anesthesia plan  Complications: No apparent anesthesia complications

## 2012-12-16 NOTE — Transfer of Care (Signed)
Immediate Anesthesia Transfer of Care Note  Patient: Erin Schultz  Procedure(s) Performed: Procedure(s) (LRB) with comments: EXPLORATORY LAPAROTOMY (N/A)  Patient Location: SICU  Anesthesia Type:General  Level of Consciousness: sedated, unresponsive and Patient remains intubated per anesthesia plan  Airway & Oxygen Therapy: Patient remains intubated per anesthesia plan  Post-op Assessment: Report given to PACU RN  Post vital signs: Reviewed and stable  Complications: No apparent anesthesia complications

## 2012-12-16 NOTE — Progress Notes (Signed)
FPTS Social Note  Erin Schultz is a 47 y.o. year old female presenting with PMH of HTN, and DM2, here with an incarcerated hernia leading to SBO and perforation s/p resection and adhesion lysis last night now intubated and on pressors post-op. Additionally she is acute oliguric likely due to septic ATN. Surgery plans to take her back to the OR within 24 hours to attempt primary closure vs replacing the wound vac.   She is currently ICU status and being actively managed by CCM- we appreciate their excellent care.  We will follow along and assume primary care when she becomes floor status.   Kevin Fenton, MD 12/16/2012, [phone]

## 2012-12-16 NOTE — Progress Notes (Signed)
Family Medicine Teaching Service Brief Primary Care Team Note Service Pager: [phone]  Patient name: Caylynn Minchew Medical record number: [id number] Date of birth: 03-Nov-1966 Age: 46 y.o. Gender: female Length of Stay:  LOS: 1 day   Pt has been transferred to [facility] services following exploratory laparotomy for small bowel resection due to strangulated, perforated ventral incisional hernia.  He abdomen is currently open with a negative pressure wound dressing and there are plans to return to the OR in 24 hours for re-exploration and attempted fascial closure vs vac change.    She is requiring high dose pressors for BP support and will remain intubated and sedated until following closure.  Noted high FiO2 & PEEP.  Noted oliguric renal failure 2o/2 septic shock.  Renal to see this AM and potential need for CVVHD.  She is 5+ L net for this hospitalization with a doubling of her Cr following surgery.  We will follow peripherally and be happy to re-assume management of her DM and HTN once she is out of the ICU and CCM is no longer following.  Please page with questions.  Andrena Mews, DO [facility] Resident - PGY-2 12/16/2012 8:00 AM

## 2012-12-16 NOTE — Progress Notes (Signed)
Inpatient Diabetes Program Recommendations  AACE/ADA: New Consensus Statement on Inpatient Glycemic Control (2013)  Target Ranges:  Prepandial:   less than 140 mg/dL      Peak postprandial:   less than 180 mg/dL (1-2 hours)      Critically ill patients:  140 - 180 mg/dL   Reason for Visit: Results for AMMI, HUTT (MRN [id number]) as of 12/16/2012 08:49  Ref. Range 12/16/2012 03:43 12/16/2012 07:53  Glucose-Capillary Latest Range: 70-99 mg/dL 782 (H) 956 (H)   Note history of diabetes.  Patient is currently in ICU and post-surgery.  CBG's greater than 200 mg/dL.  Patient would benefit from IV insulin in order to keep CBG's 140-180 mg/dL.  Consider ICU Glycemic control order set, Phase 2 "IV insulin".

## 2012-12-16 NOTE — H&P (Signed)
I have seen and examined this patient. I have discussed with Dr(s) Ermalinda Memos and de la Melvin.  I agree with their findings and plans as documented in their admission notes.  Acute Issues 1.Septic shock secondary to perforated, strangulated ventral hernia 2. Acute Kidney Injury with oliguria secondary to #1 3. Post-operative respiratory failure  Patient is s/p  Exploratory laparotomy with lysis of adhesion, small bowel resection with primary anastamosis with abdominal surgical wound vaccuum.   Pt on Ertapenam and Zosyn   IV norepinephrine/vasopressin for hemodynamic support.   Renal service consulted for Acute kidney injury with oliguria.  Mechanical Ventilation.   Family Medicine Teaching Service will request formal transfer to Critical Care Service until such time as patient's condition has stabilized.

## 2012-12-16 NOTE — Procedures (Signed)
Arterial Catheter Insertion Procedure Note Erin Schultz [id number] 03-24-1966  Procedure: Insertion of Arterial Catheter  Indications: Blood pressure monitoring  Procedure Details Consent: Risks of procedure as well as the alternatives and risks of each were explained to the (patient/caregiver).  Consent for procedure obtained. Time Out: Verified patient identification, verified procedure, site/side was marked, verified correct patient position, special equipment/implants available, medications/allergies/relevent history reviewed, required imaging and test results available.  Performed  Maximum sterile technique was used including antiseptics. Skin prep: Chlorhexidine; local anesthetic administered 20 gauge catheter was inserted into right radial artery using the Seldinger technique.  Evaluation Blood flow good; BP tracing good. Complications: No apparent complications. Left Brachial line clotted off and was discontinued  Erin Schultz 12/16/2012

## 2012-12-16 NOTE — Consult Note (Signed)
Requesting Physician:  Dr. Tyson Alias  Reason for Consult:  Acute kidney injury HPI: The patient is a 47 y.o. year-old morbidly obese female with a background of HTN, DM and MO. Has baseline CKD (baseline 0.9-1.1 but with small scarred left kidney on CT) who has developed AKI in the setting of repair (2/4) of an incarcerated perforated ventral incisional hernia, lysis of adhesions, 25 cm bowel resection, shock, VDRF and requirement for high dose pressors. She is oligoanuric.    Abd was left open (with VAC) and patient will need another operative procedure.    She was on ACE and metformin as outpt PTA. Had contrasted CT on 2/3 (that showed small scarred left kidney/right kidney poorly functioning as on delayed imaging of contrast is not seen within the collecting system).  We are asked to see to provide CRRT.   Creatinine, Ser  Date/Time Value Range Status  12/16/2012  3:00 AM 2.51* 0.50 - 1.10 mg/dL Final     DELTA CHECK NOTED  12/15/2012  1:23 PM 1.64* 0.50 - 1.10 mg/dL Final  07/26/7828 56:21 PM 0.93  0.4 - 1.2 mg/dL Final  01/17/6577  4:69 PM 1.08  0.40-1.20 mg/dL Final  05/11/5283  1:32 PM 0.97  0.40-1.20 mg/dL Final  44/11/270  5:36 PM 0.99  0.40-1.20 mg/dL Final  6/44/0347 42:59 PM 1.11  0.40-1.20 mg/dL Final  5/63/8756  4:33 PM 1.15  0.40-1.20 mg/dL Final    Past Medical History:  Past Medical History  Diagnosis Date  . Hypertension   . Diabetes mellitus without complication   . Chronic kidney disease   . H/O hiatal hernia     Past Surgical History:  Past Surgical History  Procedure Date  . Cholecystectomy   . Appendectomy   . Abdominal hysterectomy     Family History: History reviewed. No pertinent family history. Social History:  reports that she has never smoked. She does not have any smokeless tobacco history on file. She reports that she does not drink alcohol or use illicit drugs.  Allergies:  Allergies  Allergen Reactions  . Corn-Containing Products Nausea And  Vomiting    Stomach inflamed  . Sulfa Antibiotics     Childhood reaction    Home medications: Prior to Admission medications   Medication Sig Start Date End Date Taking? Authorizing Provider  amLODipine-benazepril (LOTREL) 5-40 MG per capsule Take 1 capsule by mouth daily.   Yes Historical Provider, MD  metFORMIN (GLUCOPHAGE) 1000 MG tablet TAKE 1 TABLET BY MOUTH TWICE A DAY WITH MEALS 08/09/12  Yes Ivy de Lawson Radar, DO  Multiple Vitamin (MULTIVITAMIN WITH MINERALS) TABS Take 1 tablet by mouth daily.   Yes Historical Provider, MD  triamterene-hydrochlorothiazide (MAXZIDE-25) 37.5-25 MG per tablet Take 1 tablet by mouth daily.   Yes Historical Provider, MD  fexofenadine (ALLEGRA) 180 MG tablet Take 180 mg by mouth daily as needed. For seasonal allergies    Historical Provider, MD    Inpatient medications:    . antiseptic oral rinse  15 mL Mouth Rinse QID  . chlorhexidine  15 mL Mouth Rinse BID  . heparin  5,000 Units Subcutaneous Q8H  . hydrocortisone sodium succinate  50 mg Intravenous Q6H  . imipenem-cilastatin  250 mg Intravenous Q6H  . insulin aspart  0-20 Units Subcutaneous Q4H  . micafungin (MYCAMINE) IV  100 mg Intravenous Daily   Followed by  . micafungin (MYCAMINE) IV  100 mg Intravenous Daily  . pantoprazole (PROTONIX) IV  40 mg Intravenous Daily  . sodium chloride  500 mL Intravenous Once  . vancomycin  2,000 mg Intravenous Q48H  . vancomycin  2,500 mg Intravenous Once    Review of Systems Not obtainable as patient is intubated and although awake and alert can only acknowledge not SOB and having abd pain  Physical Exam:  Blood pressure 85/53, pulse 95, temperature 98.9 F (37.2 C), temperature source Axillary, resp. rate 27, height 5\' 7"  (1.702 m), weight 157.9 kg (348 lb 1.7 oz), SpO2 98.00%. CVP 20 ETT in place, radial aline on right, right IJ central line Gen: MO AAF  Awake, follows commands Skin: no rash, cyanosis Neck: cannot see neck veins d/t large beefy  neck Lines in place Chest: ant fairly clear Heart: regular, no rub or gallop Abdomen: obese.  Large midline wound VAC in place with bloody drainage Ext: Massive ext's but difficult to discern if any edema Neuro: alert, Ox3, no focal deficit Heme/Lymph: no bruising or LAN  Labs: Basic Metabolic Panel:  Lab 12/16/12 0102 12/16/12 0138 12/16/12 0133 12/16/12 0119 12/16/12 0006 12/15/12 1323  NA 133* -- 135 131* 133* 133*  K 3.9 -- 3.6 3.9 4.1 4.3  CL 97 -- -- -- -- 96  CO2 22 -- -- -- -- 23  GLUCOSE 258* 237* -- -- 204* 214*  BUN 28* -- -- -- -- 24*  CREATININE 2.51* -- -- -- -- 1.64*  ALB -- -- -- -- -- --  CALCIUM 8.3* -- -- -- -- 9.8  PHOS -- -- -- -- -- --   Liver Function Tests:  Lab 12/15/12 1323  AST 21  ALT 20  ALKPHOS 92  BILITOT 0.6  PROT 8.0  ALBUMIN 3.1*    Lab 12/15/12 1323  LIPASE 41  AMYLASE --  Results for LONDA, MACKOWSKI (MRN [id number]) as of 12/16/2012 09:32  Ref. Range 04/10/2011 21:23  Color, Urine Latest Range: YELLOW  YELLOW  APPearance Latest Range: CLEAR  CLOUDY (A)  Specific Gravity, Urine Latest Range: 1.005-1.030  1.014  pH Latest Range: 5.0-8.0  7.0  Glucose Latest Range: NEGATIVE mg/dL NEGATIVE  Bilirubin Urine Latest Range: NEGATIVE  NEGATIVE  Ketones, ur Latest Range: NEGATIVE mg/dL NEGATIVE  Protein Latest Range: NEGATIVE mg/dL 30 (A)  Urobilinogen, UA Latest Range: 0.0-1.0 mg/dL 1.0  Nitrite Latest Range: NEGATIVE  NEGATIVE  Leukocytes, UA Latest Range: NEGATIVE  TRACE (A)  Hgb urine dipstick Latest Range: NEGATIVE  NEGATIVE  WBC, UA Latest Range: <3 WBC/hpf 0-2  RBC / HPF Latest Range: <3 RBC/hpf 0-2  Squamous Epithelial / LPF Latest Range: RARE  MANY (A)  Bacteria, UA Latest Range: RARE  RARE   No results found for this basename: AMMONIA:3 in the last 168 hours CBC:  Lab 12/16/12 0300 12/16/12 0133 12/16/12 0119 12/16/12 0006 12/15/12 1323  WBC 19.3* -- -- -- 25.0*  NEUTROABS -- -- -- -- 19.9*  HGB 10.4* 9.9* 9.9* 11.2* --   HCT 30.7* 29.0* 29.0* 33.0* --  MCV 82.7 -- -- -- 82.8  PLT 306 -- -- -- 394   Lab 12/16/12 0300  CKTOTAL --  CKMB --  CKMBINDEX --  TROPONINI <0.30   CBG:  Lab 12/16/12 0753 12/16/12 0343  GLUCAP 289* 234*    Xrays/Other Studies: Ct Abdomen Pelvis W Contrast  12/15/2012  *RADIOLOGY REPORT*  Clinical Data: Left lower quadrant pain with nausea over past 6 days.  CT ABDOMEN AND PELVIS WITH CONTRAST  Technique:  Multidetector CT imaging of the abdomen and pelvis was performed following the standard protocol during bolus  administration of intravenous contrast.  Contrast: 80mL OMNIPAQUE IOHEXOL 300 MG/ML  SOLN .  Comparison: 04/10/2011 plain film exam.  No comparison CT.  Findings: The present examination do not include the patients entire abdomen secondary to patient's size.  The maximal field of view was utilized.  Large anterior abdominal wall hernia through which fat and small bowel traverses.  This is causing small bowel obstruction with perforated bowel with contained complex air fluid collection deep to the hernia extending into the lower abdomen and upper pelvis.  Small scarred left kidney.  Right kidney poorly functioning as on delayed imaging of contrast is not seen within the collecting system.  Fatty infiltration of the liver without focal mass.  Post cholecystectomy.  No focal splenic, pancreatic or adrenal lesion.  No abdominal aortic aneurysm.  Minimal atelectatic changes lung bases.  Decompressed urinary bladder without gross abnormality.  Appearance of prior hysterectomy.  Degenerative changes thoracic and lumbar spine without bony destructive lesion.  IMPRESSION: The present examination do not include the patients entire abdomen secondary to patient's size.  The maximal field of view was utilized.  Large anterior abdominal wall hernia through which fat and small bowel traverses.  This is causing small bowel obstruction with perforated bowel with contained complex air fluid collection  deep to the hernia extending into the lower abdomen and upper pelvis.  Small scarred left kidney.  Right kidney poorly functioning as on delayed imaging of contrast is not seen within the collecting system.  Fatty infiltration of the liver.  Critical Value/emergent results were called by telephone at the time of interpretation on 12/15/2012 at 7:58 p.m. to Felicie Morn physician's assistant, who verbally acknowledged these results.   Original Report Authenticated By: Lacy Duverney, M.D.    Dg Chest Port 1 View  12/16/2012  *RADIOLOGY REPORT*  Clinical Data: Endotracheal tube position.  Line placement.  PORTABLE CHEST - 1 VIEW  Comparison: None.  Findings: Endotracheal tube tip appears to be at or no more than 5 mm above the carina.  Enteric tube tip is in the left upper quadrant consistent with location in the upper stomach.  Right central venous catheter tip overlies the mid SVC region.  No pneumothorax.  Shallow inspiration with atelectasis in the lung bases.  Mild cardiac enlargement with borderline pulmonary vascular congestion.  IMPRESSION: Endotracheal tube tip is at or just above the carina.  Enteric tube and right central venous catheter locations appear appropriate. Atelectasis in the lung bases with shallow inspiration.  Results were telephoned to the surgical ICU at 0255 hours on 12/16/2012.  I spoke with Revonda Standard.   Original Report Authenticated By: Burman Nieves, M.D.     Impression 47 y.o. year-old morbidly obese female with a background of HTN, DM and MO. Has baseline CKD (baseline 0.9-1.1 but with small scarred left kidney on CT) who has developed oligoanuric AKI in the setting of repair (2/4) of an incarcerated perforated ventral incisional hernia, lysis of adhesions, 25 cm bowel resection, shock, VDRF and requirement for high dose pressors, ACE prior to admit and IV contrast for CT scan.    REC Initiate CRRT Keep volume even (discussed with Dr. Tyson Alias - his preference right now and I  agree) No heparin All 4K fluids CCM to place catheter  Thanks for consult.  Will follow closely   Camille Bal,  MD [facility] [phone] pager 12/16/2012, 9:30 AM

## 2012-12-16 NOTE — Progress Notes (Signed)
ANTIBIOTIC CONSULT NOTE - INITIAL  Pharmacy Consult for vancomycin, imipenem Indication: rule out sepsis  Allergies  Allergen Reactions  . Corn-Containing Products Nausea And Vomiting    Stomach inflamed  . Sulfa Antibiotics     Childhood reaction    Patient Measurements: Height: 5\' 7"  (170.2 cm) Weight: 348 lb 1.7 oz (157.9 kg) IBW/kg (Calculated) : 61.6  Adjusted Body Weight:  Vital Signs: Temp: 98.9 F (37.2 C) (02/04 0757) Temp src: Axillary (02/04 0757) BP: 85/53 mmHg (02/04 0700) Pulse Rate: 95  (02/04 0730) Intake/Output from previous day: 02/03 0701 - 02/04 0700 In: 5784.8 [I.V.:5254.8; NG/GT:30; IV Piggyback:500] Out: 407 [Urine:207; Emesis/NG output:100; Drains:100] Intake/Output from this shift:    Labs:  Basename 12/16/12 0300 12/16/12 0133 12/16/12 0119 12/15/12 1323  WBC 19.3* -- -- 25.0*  HGB 10.4* 9.9* 9.9* --  PLT 306 -- -- 394  LABCREA -- -- -- --  CREATININE 2.51* -- -- 1.64*   Estimated Creatinine Clearance: 43.8 ml/min (by C-G formula based on Cr of 2.51). No results found for this basename: VANCOTROUGH:2,VANCOPEAK:2,VANCORANDOM:2,GENTTROUGH:2,GENTPEAK:2,GENTRANDOM:2,TOBRATROUGH:2,TOBRAPEAK:2,TOBRARND:2,AMIKACINPEAK:2,AMIKACINTROU:2,AMIKACIN:2, in the last 72 hours   Microbiology: Recent Results (from the past 720 hour(s))  MRSA PCR SCREENING     Status: Normal   Collection Time   12/16/12  2:51 AM      Component Value Range Status Comment   MRSA by PCR NEGATIVE  NEGATIVE Final     Medical History: Past Medical History  Diagnosis Date  . Hypertension   . Diabetes mellitus without complication   . Chronic kidney disease   . H/O hiatal hernia     Medications:  Prescriptions prior to admission  Medication Sig Dispense Refill  . amLODipine-benazepril (LOTREL) 5-40 MG per capsule Take 1 capsule by mouth daily.      . metFORMIN (GLUCOPHAGE) 1000 MG tablet TAKE 1 TABLET BY MOUTH TWICE A DAY WITH MEALS  60 tablet  5  . Multiple Vitamin  (MULTIVITAMIN WITH MINERALS) TABS Take 1 tablet by mouth daily.      Marland Kitchen triamterene-hydrochlorothiazide (MAXZIDE-25) 37.5-25 MG per tablet Take 1 tablet by mouth daily.      . fexofenadine (ALLEGRA) 180 MG tablet Take 180 mg by mouth daily as needed. For seasonal allergies       Assessment: 47 yo lady to start broad spectrum antibiotics for sepsis.    Goal of Therapy:  Vancomycin trough level 15-20 mcg/ml  Plan:  Vancomycin 2500 mg load then 2 gm IV q48 hours Primaxin 250 mg IV q6 hours. F/u need for CRRT and adjustment of dosing regimen F/u cultures and clinical course  Talbert Cage Poteet 12/16/2012,9:29 AM

## 2012-12-16 NOTE — Progress Notes (Signed)
UR Completed.  Erin Schultz [phone] 12/16/2012

## 2012-12-16 NOTE — Op Note (Signed)
PRE-OPERATIVE DIAGNOSIS: strangulated ventral incisional hernia with bowel obstruction and perforation of small bowel  POST-OPERATIVE DIAGNOSIS:  Same  PROCEDURE:  Procedure(s): Exploratory laparotomy, lysis of adhesions x 90 min,  small bowel resection, placement of abdominal vac pac  SURGEON:  Surgeon(s): Almond Lint, MD  ASSISTANT: Axel Filler, MD  ANESTHESIA:   general  DRAINS: Abdominal vac pac   LOCAL MEDICATIONS USED:  NONE  SPECIMEN:  Source of Specimen:  small bowel  DISPOSITION OF SPECIMEN:  PATHOLOGY  COUNTS:  YES  DICTATION: .Dragon Dictation  PLAN OF CARE: Admit to inpatient   PATIENT DISPOSITION:  ICU - intubated and critically ill.  PROCEDURE:  The patient was identified in the holding area and taken to the operating room where she was placed upon the operating room table. General anesthesia was induced. Her abdomen was prepped and draped in sterile fashion. Timeout was performed according to the surgical safety checklist. When all was correct we continued. The previous incision was opened from just below the umbilicus to the mid upper abdomen.  The subcutaneous tissue was divided with the cautery. There was apparent a large hernia on the right abdominal wall and a large tense hernia on the left abdominal wall. These are both originating from the midline.  Care was taken to avoid damaging the bowel visible in the hernia sacs. The abdomen was entered above the most superior hernia.  The abdominal wall and the hernias were carefully reduced. There was a significant amount of bowel and does hernias. The left abdominal wall hernia was quite tense and this contained obstructed bowel that was dusky. Once this was reduced by opening the abdominal wall, the bowel pinked up. The area of bowel below the abdominal wall was dusky and there was area of perforation present. This was closed superficially with 2-0 Vicryl suture to try limit the amount of contamination. There  was already contamination present however.  The bowel was painstakingly freed by sharp and blunt adhesio lysis. This took approximately an hour and 30 minutes. There was one serosal defect noticed after running the bowel and lysing of the adhesions. This was closed in Lembert fashion.  The area of concern with the perforation had multiple areas of duskiness and significant burden of adhesions. Approximately 25 cm of bowel were taken. The GIA-75 was used to divide the bowel and this was reanastomosed with the GIA-75, the TA 90, and 3-0 Vicryl pops.  The mesenteric defect was closed with interrupted Vicryl sutures. The bowel was run the remainder of the way. This was reduced second of the abdomen. The patient was on pressors, had not made any urine, and was febrile. Because of this the decision was made to leave the abdominal wall open. Abdominal wound VAC was placed.    The patient was kept intubated and transferred to the ICU critically ill. The ICU team was contacted. Family practice team was contacted. Needle and sponge counts were correct.

## 2012-12-16 NOTE — Consult Note (Signed)
PULMONARY  / CRITICAL CARE MEDICINE  Name: Erin Schultz MRN: [id number] DOB: 21-Jan-1966    ADMISSION DATE:  12/15/2012 CONSULTATION DATE:  12/16/12  REFERRING MD :  Donell Beers ([facility])  CHIEF COMPLAINT:  Abdominal pain  BRIEF PATIENT DESCRIPTION: 47 y/o morbidly obese female was admitted on 12/15/12 with an incarcerated abdominal wall hernia and perforated small bowel requiring emergent surgery.  PCCM consulted for post op vent management.  SIGNIFICANT EVENTS / STUDIES:  9/3 CT abdomen>> large anterior abdominal wall hernia, perforation, and complex fluid collection deep to the bowel; small L kidney, likely poorly functioning R kidney given lack of contrast.  Fatty infiltration of liver 9/3 >> ex-lap, lysis of adhesions, perforation repair, 25cm of bowel resection  LINES / TUBES: 9/3 ETT >> 9/3 R IJ CVL >> 9/3 radial a-line >>  CULTURES: 9/4 blood >> 9/3 urine >>  ANTIBIOTICS: 9/4 Ertapenem >>  HISTORY OF PRESENT ILLNESS:  47 y/o morbidly obese female was admitted on 12/15/12 with an incarcerated abdominal wall hernia and perforated small bowel requiring emergent surgery.  PCCM consulted for post op vent management.  The patient was intubated an unable to provide history.  Apparently she had 6 days of abdominal pain prior to presenting to the [facility] ED.  During surgery she was noted to be oliguric.  Her abdominal wound was not closed given her critically ill condition.  In the OR she was given nearly 3L total volume (crystalloid and albumin) but only put out 177cc urine.  PAST MEDICAL HISTORY :  Past Medical History  Diagnosis Date  . Hypertension   . Diabetes mellitus without complication   . Chronic kidney disease   . H/O hiatal hernia    Past Surgical History  Procedure Date  . Cholecystectomy   . Appendectomy   . Abdominal hysterectomy    Prior to Admission medications   Medication Sig Start Date End Date Taking? Authorizing Provider  amLODipine-benazepril (LOTREL) 5-40 MG per  capsule Take 1 capsule by mouth daily.   Yes Historical Provider, MD  metFORMIN (GLUCOPHAGE) 1000 MG tablet TAKE 1 TABLET BY MOUTH TWICE A DAY WITH MEALS 08/09/12  Yes Ivy de Lawson Radar, DO  Multiple Vitamin (MULTIVITAMIN WITH MINERALS) TABS Take 1 tablet by mouth daily.   Yes Historical Provider, MD  triamterene-hydrochlorothiazide (MAXZIDE-25) 37.5-25 MG per tablet Take 1 tablet by mouth daily.   Yes Historical Provider, MD  fexofenadine (ALLEGRA) 180 MG tablet Take 180 mg by mouth daily as needed. For seasonal allergies    Historical Provider, MD   Allergies  Allergen Reactions  . Corn-Containing Products Nausea And Vomiting    Stomach inflamed  . Sulfa Antibiotics     Childhood reaction    FAMILY HISTORY:  History reviewed. No pertinent family history. SOCIAL HISTORY:  reports that she has never smoked. She does not have any smokeless tobacco history on file. She reports that she does not drink alcohol or use illicit drugs.  REVIEW OF SYSTEMS:  Cannot obtain due to intubation  SUBJECTIVE:   VITAL SIGNS: Temp:  [98.2 F (36.8 C)-100.8 F (38.2 C)] 100.8 F (38.2 C) (02/03 2147) Pulse Rate:  [105-110] 110  (02/03 2147) Resp:  [16-24] 24  (02/03 2147) BP: (94-103)/(54-63) 100/63 mmHg (02/03 2147) SpO2:  [95 %-99 %] 95 % (02/03 2147) Weight:  [157.9 kg (348 lb 1.7 oz)] 157.9 kg (348 lb 1.7 oz) (02/04 0100) HEMODYNAMICS:   VENTILATOR SETTINGS:   INTAKE / OUTPUT: Intake/Output      02/03 0701 -  02/04 0700   I.V. (mL/kg) 4000 (25.3)   IV Piggyback 500   Total Intake(mL/kg) 4500 (28.5)   Urine (mL/kg/hr) 177 (0)   Total Output 177   Net +4323         PHYSICAL EXAMINATION:  Gen: morbidly obese, sedated on vent HEENT: NCAT, PERRL, EOMi, ETT in place PULM: CTA B CV: tachy, regular, no mgr, no JVD AB: no bowel sounds, anterior wound with wound vac in place Ext: cool, pitting edema in legs, no clubbing, no cyanosis Derm: no rash or skin breakdown apart from surgical  wound Neuro: sedated on vent   LABS:  Lab 12/16/12 0138 12/16/12 0133 12/16/12 0119 12/16/12 0006 12/15/12 2022 12/15/12 1323  HGB -- 9.9* 9.9* 11.2* -- --  WBC -- -- -- -- -- 25.0*  PLT -- -- -- -- -- 394  NA -- 135 131* 133* -- --  K -- 3.6 3.9 -- -- --  CL -- -- -- -- -- 96  CO2 -- -- -- -- -- 23  GLUCOSE 237* -- -- 204* -- 214*  BUN -- -- -- -- -- 24*  CREATININE -- -- -- -- -- 1.64*  CALCIUM -- -- -- -- -- 9.8  MG -- -- -- -- -- --  PHOS -- -- -- -- -- --  AST -- -- -- -- -- 21  ALT -- -- -- -- -- 20  ALKPHOS -- -- -- -- -- 92  BILITOT -- -- -- -- -- 0.6  PROT -- -- -- -- -- 8.0  ALBUMIN -- -- -- -- -- 3.1*  APTT -- -- -- -- -- --  INR -- -- -- -- 1.20 --  LATICACIDVEN -- -- -- -- -- --  TROPONINI -- -- -- -- -- --  PROCALCITON -- -- -- -- -- --  PROBNP -- -- -- -- -- --  O2SATVEN -- -- -- -- -- --  PHART -- 7.350 7.331* -- -- --  PCO2ART -- 43.7 32.9* -- -- --  PO2ART -- 73.0* 69.0* -- -- --   No results found for this basename: GLUCAP:5 in the last 168 hours  2/4 CXR: bilateral atelectasis, ett just above carina; central line in place  ASSESSMENT / PLAN:  PULMONARY A: 1) Post op respiratory failure P:   -full vent support -increase PEEP due to hypoxemia and body habitus -repeat ABG now -daily SBT/WUA/CXR/ABG  CARDIOVASCULAR A:  1) Shock, presumably septic P:  -check EKG -monitor CVP -levophed as needed -hold off on sepsis protocol given very positive fluid balance and out of window for EGDT -check troponin  RENAL A:   1) Oliguric renal failure due to sepsis P:   -IVF -monitor CVP -consult renal 2/4 -will likely need CVVHD  GASTROINTESTINAL A:   1) Abdominal hernia, bowel strangulation and perforation now s/p exlap, partial small bowel resection P:   -will need to go back to OR for closure -npo -per [facility]  HEMATOLOGIC A:   1) No acute issues P:  -repeat H/H now  INFECTIOUS A:   1) Septic shock from peritonitis; CT abdomen with  fluid collection worrisome for abscess, but apparently not seen in OR (not noted in op note) P:   -ertapenem -f/u urine, blood cultures -add anti-fungal therapy if shock worsens   ENDOCRINE A:  1) DM2 P:   -ICU hyperglycemia protocol  NEUROLOGIC A:   1) Post op pain control 2) Vent synchrony P:   -fentanyl gtt -versed gtt for now due to  asynchrony, but change to intermittent later in AM 2/4 to avoid benzo build up with renal failure  Thromboprophylaxis: SCD HOB >30 degrees Ulcer prophylaxis: Pantoprazole   TODAY'S SUMMARY: 47 y/o morbidly obese diabetic female with septic shock from an incarcerated and perforated small bowel due to an abdominal wall hernia.  I have personally obtained a history, examined the patient, evaluated laboratory and imaging results, formulated the assessment and plan and placed orders. CRITICAL CARE: The patient is critically ill with multiple organ systems failure and requires high complexity decision making for assessment and support, frequent evaluation and titration of therapies, application of advanced monitoring technologies and extensive interpretation of multiple databases. Critical Care Time devoted to patient care services described in this note is 60 minutes.   Fonnie Jarvis Pulmonary and Critical Care Medicine [facility] Pager: [phone]  12/16/2012, 3:04 AM

## 2012-12-16 NOTE — Consult Note (Signed)
PULMONARY  / CRITICAL CARE MEDICINE  Name: Erin Schultz MRN: [id number] DOB: 29-Dec-1965    ADMISSION DATE:  12/15/2012 CONSULTATION DATE:  12/16/12  REFERRING MD :  Donell Beers (CCS)  CHIEF COMPLAINT:  Abdominal pain  BRIEF PATIENT DESCRIPTION: 47 y/o morbidly obese female was admitted on 12/15/12 with an incarcerated abdominal wall hernia and perforated small bowel requiring emergent surgery.  PCCM consulted for post op vent management.  SIGNIFICANT EVENTS / STUDIES:  9/3 CT abdomen>> large anterior abdominal wall hernia, perforation, and complex fluid collection deep to the bowel; small L kidney, likely poorly functioning R kidney given lack of contrast.  Fatty infiltration of liver 9/3 >> ex-lap, lysis of adhesions, perforation repair, 25cm of bowel resection 9/4- ARF, limited urine output  LINES / TUBES: 9/3 ETT >> 9/3 R IJ CVL >> 9/3 radial a-line >>  CULTURES: 9/4 blood >> 9/3 urine >>  ANTIBIOTICS: 9/4 Ertapenem >> 9/4 Myco>>> 9/4 vanc>>> 9/4 Imi>>>  SUBJECTIVE:  refractory shock, renal failure  VITAL SIGNS: Temp:  [98.2 F (36.8 C)-100.8 F (38.2 C)] 98.9 F (37.2 C) (02/04 0757) Pulse Rate:  [95-110] 95  (02/04 0730) Resp:  [16-33] 27  (02/04 0730) BP: (78-103)/(26-64) 85/53 mmHg (02/04 0700) SpO2:  [90 %-99 %] 98 % (02/04 0730) Arterial Line BP: (81-120)/(42-72) 86/72 mmHg (02/04 0730) FiO2 (%):  [70 %-100 %] 70 % (02/04 0833) Weight:  [157.9 kg (348 lb 1.7 oz)] 157.9 kg (348 lb 1.7 oz) (02/04 0100) HEMODYNAMICS: CVP:  [23 mmHg] 23 mmHg VENTILATOR SETTINGS: Vent Mode:  [-] PRVC FiO2 (%):  [70 %-100 %] 70 % Set Rate:  [14 bmp] 14 bmp Vt Set:  [550 mL] 550 mL PEEP:  [10 cmH20-16 cmH20] 12 cmH20 Plateau Pressure:  [29 cmH20-34 cmH20] 32 cmH20 INTAKE / OUTPUT: Intake/Output      02/03 0701 - 02/04 0700 02/04 0701 - 02/05 0700   I.V. (mL/kg) 5254.8 (33.3)    NG/GT 30    IV Piggyback 500    Total Intake(mL/kg) 5784.8 (36.6)    Urine (mL/kg/hr) 207 (0.1)    Emesis/NG output 100    Drains 100    Total Output 407    Net +5377.8           PHYSICAL EXAMINATION:  Gen: morbidly obese, sedated rass -3 HEENT: NCAT, PERRL, EOMi, ETT in place PULM: CTA B CV: tachy, regular, no mgr, no JVD AB: no bowel sounds, anterior wound with wound vac in place Ext: cool, pitting edema in legs, no clubbing, no cyanosis Derm: no rash or skin breakdown apart from surgical wound Neuro: sedated on vent   LABS:  Lab 12/16/12 0505 12/16/12 0353 12/16/12 0300 12/16/12 0138 12/16/12 0133 12/16/12 0119 12/16/12 0006 12/15/12 2022 12/15/12 1323  HGB -- -- 10.4* -- 9.9* 9.9* -- -- --  WBC -- -- 19.3* -- -- -- -- -- 25.0*  PLT -- -- 306 -- -- -- -- -- 394  NA -- -- 133* -- 135 131* -- -- --  K -- -- 3.9 -- 3.6 -- -- -- --  CL -- -- 97 -- -- -- -- -- 96  CO2 -- -- 22 -- -- -- -- -- 23  GLUCOSE -- -- 258* 237* -- -- 204* -- --  BUN -- -- 28* -- -- -- -- -- 24*  CREATININE -- -- 2.51* -- -- -- -- -- 1.64*  CALCIUM -- -- 8.3* -- -- -- -- -- 9.8  MG -- -- -- -- -- -- -- -- --  PHOS -- -- -- -- -- -- -- -- --  AST -- -- -- -- -- -- -- -- 21  ALT -- -- -- -- -- -- -- -- 20  ALKPHOS -- -- -- -- -- -- -- -- 92  BILITOT -- -- -- -- -- -- -- -- 0.6  PROT -- -- -- -- -- -- -- -- 8.0  ALBUMIN -- -- -- -- -- -- -- -- 3.1*  APTT -- -- -- -- -- -- -- -- --  INR -- -- -- -- -- -- -- 1.20 --  LATICACIDVEN -- -- -- -- -- -- -- -- --  TROPONINI -- -- <0.30 -- -- -- -- -- --  PROCALCITON -- -- -- -- -- -- -- -- --  PROBNP -- -- -- -- -- -- -- -- --  O2SATVEN -- -- -- -- -- -- -- -- --  PHART 7.383 7.435 -- -- 7.350 -- -- -- --  PCO2ART 32.8* 29.4* -- -- 43.7 -- -- -- --  PO2ART 63.0* 46.0* -- -- 73.0* -- -- -- --    Lab 12/16/12 0753 12/16/12 0343  GLUCAP 289* 234*    2/4 CXR: bilateral atelectasis, ett just above carina; central line in place  ASSESSMENT / PLAN:  PULMONARY A: 1) Post op respiratory failure P:   -abg reviewed, maintain current MV -no room to  reduce peep / o2 needs -pcxr in am  -May need to increase peep to 15 if desats on 70%  CARDIOVASCULAR A:  1) Shock, presumably septic P:  -EGDT to assess svo2, cvp 20, however, need pulm pressures to assess trend -echo assessment -levophed can increase to MAp goals 65 -maintain vaso -stress steroids -may alter cvp goal to 16 -may need epi  RENAL A:   1) Oliguric renal failure due to sepsis P:   -30- cc last 24 hrs -no hydro on CT -start cvvhd, I called renal, agree  GASTROINTESTINAL A:   1) Abdominal hernia, bowel strangulation and perforation now s/p exlap, partial small bowel resection P:   -will need to go back to OR for closure -npo -per CCS -lft -avoid tpn  HEMATOLOGIC A:   1) MIld anemia P:  -sub q hep  INFECTIOUS A:   1) Septic shock from peritonitis; CT abdomen with fluid collection worrisome for abscess, but apparently not seen in OR (not noted in op note) P:   -ertapenem, dc -clinically declining, add imi, vanc, myco  ENDOCRINE A:  1) DM2 P:   -ICU hyperglycemia protocol  NEUROLOGIC A:   1) Post op pain control 2) Vent synchrony P:   -fentanyl gtt -versed gtt for now due to asynchrony, needed -may need paralysis  Thromboprophylaxis: SCD HOB >30 degrees Ulcer prophylaxis: Pantoprazole Family updated, mom   TODAY'S SUMMARY: 46 y/o morbidly obese diabetic female with septic shock from an incarcerated and perforated small bowel due to an abdominal wall hernia. MODS, refrcatory shock, CVVHD  I have personally obtained a history, examined the patient, evaluated laboratory and imaging results, formulated the assessment and plan and placed orders. CRITICAL CARE: The patient is critically ill with multiple organ systems failure and requires high complexity decision making for assessment and support, frequent evaluation and titration of therapies, application of advanced monitoring technologies and extensive interpretation of multiple databases.  Critical Care Time devoted to patient care services described in this note is 60 minutes.   Rory Percy J.,MD Pulmonary and Critical Care Medicine [facility] Pager: [phone]  12/16/2012, 9:05  AM    

## 2012-12-17 ENCOUNTER — Encounter (HOSPITAL_COMMUNITY): Payer: Self-pay | Admitting: Anesthesiology

## 2012-12-17 ENCOUNTER — Inpatient Hospital Stay (HOSPITAL_COMMUNITY): Payer: BC Managed Care – PPO

## 2012-12-17 ENCOUNTER — Encounter (HOSPITAL_COMMUNITY)
Admission: EM | Disposition: A | Discharge status: Home Health | Payer: Self-pay | Source: Home / Self Care | Attending: Pulmonary Disease

## 2012-12-17 ENCOUNTER — Encounter (HOSPITAL_COMMUNITY): Payer: Self-pay | Admitting: General Surgery

## 2012-12-17 ENCOUNTER — Inpatient Hospital Stay (HOSPITAL_COMMUNITY): Payer: BC Managed Care – PPO | Admitting: Anesthesiology

## 2012-12-17 HISTORY — PX: VACUUM ASSISTED CLOSURE CHANGE: SHX5227

## 2012-12-17 LAB — GLUCOSE, CAPILLARY
Glucose-Capillary: 104 mg/dL — ABNORMAL HIGH (ref 70–99)
Glucose-Capillary: 109 mg/dL — ABNORMAL HIGH (ref 70–99)
Glucose-Capillary: 117 mg/dL — ABNORMAL HIGH (ref 70–99)
Glucose-Capillary: 118 mg/dL — ABNORMAL HIGH (ref 70–99)
Glucose-Capillary: 126 mg/dL — ABNORMAL HIGH (ref 70–99)
Glucose-Capillary: 126 mg/dL — ABNORMAL HIGH (ref 70–99)
Glucose-Capillary: 128 mg/dL — ABNORMAL HIGH (ref 70–99)
Glucose-Capillary: 212 mg/dL — ABNORMAL HIGH (ref 70–99)
Glucose-Capillary: 222 mg/dL — ABNORMAL HIGH (ref 70–99)
Glucose-Capillary: 261 mg/dL — ABNORMAL HIGH (ref 70–99)
Glucose-Capillary: 278 mg/dL — ABNORMAL HIGH (ref 70–99)
Glucose-Capillary: 288 mg/dL — ABNORMAL HIGH (ref 70–99)
Glucose-Capillary: 86 mg/dL (ref 70–99)
Glucose-Capillary: 96 mg/dL (ref 70–99)
Glucose-Capillary: 98 mg/dL (ref 70–99)

## 2012-12-17 LAB — COMPREHENSIVE METABOLIC PANEL
AST: 22 U/L (ref 0–37)
Alkaline Phosphatase: 83 U/L (ref 39–117)
BUN: 17 mg/dL (ref 6–23)
CO2: 24 mEq/L (ref 19–32)
Chloride: 102 mEq/L (ref 96–112)
Creatinine, Ser: 1.08 mg/dL (ref 0.50–1.10)
GFR calc non Af Amer: 60 mL/min — ABNORMAL LOW (ref >90)
Potassium: 3.7 mEq/L (ref 3.5–5.1)
Total Bilirubin: 0.4 mg/dL (ref 0.3–1.2)

## 2012-12-17 LAB — BASIC METABOLIC PANEL
BUN: 14 mg/dL (ref 6–23)
Creatinine, Ser: 0.96 mg/dL (ref 0.50–1.10)
GFR calc Af Amer: 80 mL/min — ABNORMAL LOW (ref >90)
GFR calc non Af Amer: 69 mL/min — ABNORMAL LOW (ref >90)

## 2012-12-17 LAB — POCT I-STAT 3, ART BLOOD GAS (G3+)
Patient temperature: 98.3
TCO2: 27 mmol/L (ref 0–100)
pCO2 arterial: 41.2 mmHg (ref 35.0–45.0)
pH, Arterial: 7.405 (ref 7.350–7.450)

## 2012-12-17 LAB — CBC WITH DIFFERENTIAL/PLATELET
Basophils Absolute: 0 10*3/uL (ref 0.0–0.1)
Eosinophils Absolute: 0 10*3/uL (ref 0.0–0.7)
Eosinophils Relative: 0 % (ref 0–5)
Lymphs Abs: 1.1 10*3/uL (ref 0.7–4.0)
MCH: 28 pg (ref 26.0–34.0)
Monocytes Absolute: 2.1 10*3/uL — ABNORMAL HIGH (ref 0.1–1.0)
Neutrophils Relative %: 83 % — ABNORMAL HIGH (ref 43–77)
Platelets: 224 10*3/uL (ref 150–400)
RBC: 3.25 MIL/uL — ABNORMAL LOW (ref 3.87–5.11)
RDW: 13.2 % (ref 11.5–15.5)
WBC: 18.9 10*3/uL — ABNORMAL HIGH (ref 4.0–10.5)

## 2012-12-17 SURGERY — REPLACEMENT, WOUND VAC DRESSING, ABDOMEN
Anesthesia: General

## 2012-12-17 MED ORDER — FENTANYL CITRATE 0.05 MG/ML IJ SOLN
INTRAMUSCULAR | Status: DC | PRN
  Administered 2012-12-17: 250 ug via INTRAVENOUS

## 2012-12-17 MED ORDER — SODIUM CHLORIDE 0.9 % IV BOLUS (SEPSIS)
500.0000 mL | Freq: Once | INTRAVENOUS | Status: AC
  Administered 2012-12-17: 500 mL via INTRAVENOUS

## 2012-12-17 MED ORDER — LACTATED RINGERS IV SOLN
INTRAVENOUS | Status: DC | PRN
  Administered 2012-12-17: 18:00:00 via INTRAVENOUS

## 2012-12-17 MED ORDER — POTASSIUM CHLORIDE 10 MEQ/100ML IV SOLN
10.0000 meq | INTRAVENOUS | Status: AC
  Administered 2012-12-17 (×2): 10 meq via INTRAVENOUS
  Filled 2012-12-17: qty 200

## 2012-12-17 MED ORDER — VANCOMYCIN HCL 10 G IV SOLR
2000.0000 mg | INTRAVENOUS | Status: DC
  Administered 2012-12-17: 2000 mg via INTRAVENOUS
  Filled 2012-12-17 (×2): qty 2000

## 2012-12-17 MED ORDER — SODIUM CHLORIDE 0.9 % IV SOLN: 1000.0000 mL | INTRAVENOUS | Status: DC

## 2012-12-17 MED ORDER — MIDAZOLAM HCL 5 MG/5ML IJ SOLN
INTRAMUSCULAR | Status: DC | PRN
  Administered 2012-12-17: 2 mg via INTRAVENOUS

## 2012-12-17 MED ORDER — ROCURONIUM BROMIDE 100 MG/10ML IV SOLN
INTRAVENOUS | Status: DC | PRN
  Administered 2012-12-17 (×2): 50 mg via INTRAVENOUS

## 2012-12-17 MED ORDER — SODIUM CHLORIDE 0.9 % IV BOLUS (SEPSIS): 250.0000 mL | Freq: Once | INTRAVENOUS | Status: DC

## 2012-12-17 SURGICAL SUPPLY — 41 items
BLADE SURG ROTATE 9660 (MISCELLANEOUS) IMPLANT
CANISTER SUCT LVC 12 LTR MEDI- (MISCELLANEOUS) ×2 IMPLANT
CANISTER SUCTION 2500CC (MISCELLANEOUS) ×6 IMPLANT
CANISTER WOUND CARE 500ML ATS (WOUND CARE) ×2 IMPLANT
CHLORAPREP W/TINT 26ML (MISCELLANEOUS) IMPLANT
CLOTH BEACON ORANGE TIMEOUT ST (SAFETY) ×2 IMPLANT
COVER MAYO STAND STRL (DRAPES) IMPLANT
COVER SURGICAL LIGHT HANDLE (MISCELLANEOUS) ×2 IMPLANT
DRAPE LAPAROSCOPIC ABDOMINAL (DRAPES) ×2 IMPLANT
DRAPE UTILITY 15X26 W/TAPE STR (DRAPE) ×4 IMPLANT
DRAPE WARM FLUID 44X44 (DRAPE) IMPLANT
ELECT BLADE 6.5 EXT (BLADE) IMPLANT
ELECT CAUTERY BLADE 6.4 (BLADE) IMPLANT
ELECT REM PT RETURN 9FT ADLT (ELECTROSURGICAL) ×2
ELECTRODE REM PT RTRN 9FT ADLT (ELECTROSURGICAL) ×1 IMPLANT
GLOVE EUDERMIC 7 POWDERFREE (GLOVE) ×4 IMPLANT
GOWN PREVENTION PLUS XLARGE (GOWN DISPOSABLE) ×2 IMPLANT
GOWN STRL NON-REIN LRG LVL3 (GOWN DISPOSABLE) ×4 IMPLANT
KIT BASIN OR (CUSTOM PROCEDURE TRAY) ×2 IMPLANT
KIT ROOM TURNOVER OR (KITS) ×2 IMPLANT
LIGASURE IMPACT 36 18CM CVD LR (INSTRUMENTS) IMPLANT
NS IRRIG 1000ML POUR BTL (IV SOLUTION) ×4 IMPLANT
PACK GENERAL/GYN (CUSTOM PROCEDURE TRAY) ×2 IMPLANT
PAD ARMBOARD 7.5X6 YLW CONV (MISCELLANEOUS) ×4 IMPLANT
PAD SHARPS MAGNETIC DISPOSAL (MISCELLANEOUS) IMPLANT
SPECIMEN JAR X LARGE (MISCELLANEOUS) IMPLANT
SPONGE ABDOMINAL VAC ABTHERA (MISCELLANEOUS) ×2 IMPLANT
SPONGE LAP 18X18 X RAY DECT (DISPOSABLE) IMPLANT
STAPLER VISISTAT 35W (STAPLE) IMPLANT
SUCTION POOLE TIP (SUCTIONS) ×2 IMPLANT
SUT PDS AB 1 TP1 96 (SUTURE) IMPLANT
SUT SILK 2 0 SH CR/8 (SUTURE) IMPLANT
SUT SILK 2 0 TIES 10X30 (SUTURE) IMPLANT
SUT SILK 3 0 SH CR/8 (SUTURE) IMPLANT
SUT SILK 3 0 TIES 10X30 (SUTURE) IMPLANT
SYR BULB IRRIGATION 50ML (SYRINGE) IMPLANT
TOWEL OR 17X24 6PK STRL BLUE (TOWEL DISPOSABLE) ×2 IMPLANT
TOWEL OR 17X26 10 PK STRL BLUE (TOWEL DISPOSABLE) ×2 IMPLANT
TRAY FOLEY CATH 14FRSI W/METER (CATHETERS) IMPLANT
WATER STERILE IRR 1000ML POUR (IV SOLUTION) ×2 IMPLANT
YANKAUER SUCT BULB TIP NO VENT (SUCTIONS) IMPLANT

## 2012-12-17 NOTE — Progress Notes (Signed)
2 Days Post-Op  Subjective: Improved. Arousable, opens eyes, follows commands. Seems aware of surroundings and issues.  She now has good urine output. Requiring much less pressor support. Requiring lower vent settings.   PO2 65, PCO2 41, pH 7.40 on 8 of PEEP and FiO2 40%.  Potassium 3.7. BUN 17. Creatinine 1.08.Hemoglobin 9.1. WBC 18,900.  Objective: Vital signs in last 24 hours: Temp:  [98.9 F (37.2 C)-100.2 F (37.9 C)] 100.2 F (37.9 C) (02/04 1625) Pulse Rate:  [88-113] 104  (02/05 0345) Resp:  [17-32] 21  (02/05 0345) BP: (82-128)/(47-85) 128/57 mmHg (02/05 0345) SpO2:  [93 %-100 %] 99 % (02/05 0345) Arterial Line BP: (72-152)/(44-75) 119/55 mmHg (02/04 2200) FiO2 (%):  [40 %-80 %] 40 % (02/05 0345) Weight:  [338 lb 6.5 oz (153.5 kg)] 338 lb 6.5 oz (153.5 kg) (02/05 0200)    Intake/Output from previous day: 02/04 0701 - 02/05 0700 In: 1878.6 [I.V.:898.6; NG/GT:180; IV Piggyback:800] Out: 3424 [Urine:2155; Emesis/NG output:100; Drains:400] Intake/Output this shift: Total I/O In: 175.2 [I.V.:15.2; NG/GT:60; IV Piggyback:100] Out: 1481 [Urine:1175; Drains:200; Other:106]  General appearance: over the obese. Opens eyes to voice. Follows commands. GI: abdomen soft. Tender. Negative pressure dressing in place. Drainage watery, clear serosanguineous. No enteric drainage.  Lab Results:  Results for orders placed during the hospital encounter of 12/15/12 (from the past 24 hour(s))  GLUCOSE, CAPILLARY     Status: Abnormal   Collection Time   12/16/12  7:53 AM      Component Value Range   Glucose-Capillary 289 (*) 70 - 99 mg/dL   Comment 1 Documented in Chart     Comment 2 Notify RN    CARBOXYHEMOGLOBIN     Status: Normal   Collection Time   12/16/12 10:10 AM      Component Value Range   Total hemoglobin 15.3  12.0 - 16.0 g/dL   O2 Saturation 96.0     Carboxyhemoglobin 0.9  0.5 - 1.5 %   Methemoglobin 1.1  0.0 - 1.5 %  GLUCOSE, CAPILLARY     Status: Abnormal   Collection  Time   12/16/12 10:13 AM      Component Value Range   Glucose-Capillary 288 (*) 70 - 99 mg/dL  GLUCOSE, CAPILLARY     Status: Abnormal   Collection Time   12/16/12 11:39 AM      Component Value Range   Glucose-Capillary 297 (*) 70 - 99 mg/dL   Comment 1 Documented in Chart     Comment 2 Notify RN    BLOOD GAS, VENOUS     Status: Abnormal   Collection Time   12/16/12 12:00 PM      Component Value Range   pH, Ven 7.273  7.250 - 7.300   pCO2, Ven 44.0 (*) 45.0 - 50.0 mmHg   pO2, Ven 37.5  30.0 - 45.0 mmHg   Bicarbonate 19.7 (*) 20.0 - 24.0 mEq/L   TCO2 21.0  0 - 100 mmol/L   Acid-base deficit 6.0 (*) 0.0 - 2.0 mmol/L   O2 Saturation 62.7     Patient temperature 98.6     Collection site VEIN     Drawn by COLLECTED BY NURSE     Sample type VENOUS    GLUCOSE, CAPILLARY     Status: Abnormal   Collection Time   12/16/12  2:09 PM      Component Value Range   Glucose-Capillary 278 (*) 70 - 99 mg/dL  GLUCOSE, CAPILLARY     Status: Abnormal  Collection Time   12/16/12  2:44 PM      Component Value Range   Glucose-Capillary 261 (*) 70 - 99 mg/dL  CARBOXYHEMOGLOBIN     Status: Abnormal   Collection Time   12/16/12  3:36 PM      Component Value Range   Total hemoglobin 10.5 (*) 12.0 - 16.0 g/dL   O2 Saturation 16.1     Carboxyhemoglobin 1.1  0.5 - 1.5 %   Methemoglobin 1.3  0.0 - 1.5 %  GLUCOSE, CAPILLARY     Status: Abnormal   Collection Time   12/16/12  3:42 PM      Component Value Range   Glucose-Capillary 212 (*) 70 - 99 mg/dL  CBC     Status: Abnormal   Collection Time   12/16/12  4:14 PM      Component Value Range   WBC 20.4 (*) 4.0 - 10.5 K/uL   RBC 3.79 (*) 3.87 - 5.11 MIL/uL   Hemoglobin 10.7 (*) 12.0 - 15.0 g/dL   HCT 09.6 (*) 04.5 - 40.9 %   MCV 82.3  78.0 - 100.0 fL   MCH 28.2  26.0 - 34.0 pg   MCHC 34.3  30.0 - 36.0 g/dL   RDW 81.1  91.4 - 78.2 %   Platelets 286  150 - 400 K/uL  RENAL FUNCTION PANEL     Status: Abnormal   Collection Time   12/16/12  4:14 PM       Component Value Range   Sodium 135  135 - 145 mEq/L   Potassium 3.8  3.5 - 5.1 mEq/L   Chloride 100  96 - 112 mEq/L   CO2 21  19 - 32 mEq/L   Glucose, Bld 225 (*) 70 - 99 mg/dL   BUN 25 (*) 6 - 23 mg/dL   Creatinine, Ser 9.56 (*) 0.50 - 1.10 mg/dL   Calcium 8.1 (*) 8.4 - 10.5 mg/dL   Phosphorus 2.9  2.3 - 4.6 mg/dL   Albumin 2.4 (*) 3.5 - 5.2 g/dL   GFR calc non Af Amer 29 (*) >90 mL/min   GFR calc Af Amer 34 (*) >90 mL/min  GLUCOSE, CAPILLARY     Status: Abnormal   Collection Time   12/16/12  4:39 PM      Component Value Range   Glucose-Capillary 222 (*) 70 - 99 mg/dL  LACTIC ACID, PLASMA     Status: Normal   Collection Time   12/16/12  5:10 PM      Component Value Range   Lactic Acid, Venous 1.3  0.5 - 2.2 mmol/L  PROCALCITONIN     Status: Normal   Collection Time   12/16/12  5:10 PM      Component Value Range   Procalcitonin 52.08    GLUCOSE, CAPILLARY     Status: Abnormal   Collection Time   12/16/12  5:43 PM      Component Value Range   Glucose-Capillary 170 (*) 70 - 99 mg/dL  GLUCOSE, CAPILLARY     Status: Abnormal   Collection Time   12/16/12  6:37 PM      Component Value Range   Glucose-Capillary 140 (*) 70 - 99 mg/dL  CARBOXYHEMOGLOBIN     Status: Normal   Collection Time   12/16/12  7:18 PM      Component Value Range   Total hemoglobin 12.7  12.0 - 16.0 g/dL   O2 Saturation 21.3     Carboxyhemoglobin 0.5  0.5 -  1.5 %   Methemoglobin 0.9  0.0 - 1.5 %  GLUCOSE, CAPILLARY     Status: Abnormal   Collection Time   12/16/12  7:51 PM      Component Value Range   Glucose-Capillary 118 (*) 70 - 99 mg/dL  GLUCOSE, CAPILLARY     Status: Abnormal   Collection Time   12/16/12  8:59 PM      Component Value Range   Glucose-Capillary 111 (*) 70 - 99 mg/dL  GLUCOSE, CAPILLARY     Status: Normal   Collection Time   12/16/12 10:03 PM      Component Value Range   Glucose-Capillary 99  70 - 99 mg/dL  GLUCOSE, CAPILLARY     Status: Normal   Collection Time   12/16/12 11:07 PM       Component Value Range   Glucose-Capillary 96  70 - 99 mg/dL  GLUCOSE, CAPILLARY     Status: Normal   Collection Time   12/17/12 12:13 AM      Component Value Range   Glucose-Capillary 98  70 - 99 mg/dL   Comment 1 Documented in Chart     Comment 2 Notify RN    PROTIME-INR     Status: Abnormal   Collection Time   12/17/12  4:50 AM      Component Value Range   Prothrombin Time 16.4 (*) 11.6 - 15.2 seconds   INR 1.35  0.00 - 1.49  CBC WITH DIFFERENTIAL     Status: Abnormal (Preliminary result)   Collection Time   12/17/12  4:50 AM      Component Value Range   WBC 18.9 (*) 4.0 - 10.5 K/uL   RBC 3.25 (*) 3.87 - 5.11 MIL/uL   Hemoglobin 9.1 (*) 12.0 - 15.0 g/dL   HCT 40.9 (*) 81.1 - 91.4 %   MCV 82.2  78.0 - 100.0 fL   MCH 28.0  26.0 - 34.0 pg   MCHC 34.1  30.0 - 36.0 g/dL   RDW 78.2  95.6 - 21.3 %   Platelets 224  150 - 400 K/uL   Neutrophils Relative PENDING  43 - 77 %   Neutro Abs PENDING  1.7 - 7.7 K/uL   Band Neutrophils PENDING  0 - 10 %   Lymphocytes Relative PENDING  12 - 46 %   Lymphs Abs PENDING  0.7 - 4.0 K/uL   Monocytes Relative PENDING  3 - 12 %   Monocytes Absolute PENDING  0.1 - 1.0 K/uL   Eosinophils Relative PENDING  0 - 5 %   Eosinophils Absolute PENDING  0.0 - 0.7 K/uL   Basophils Relative PENDING  0 - 1 %   Basophils Absolute PENDING  0.0 - 0.1 K/uL   WBC Morphology PENDING     RBC Morphology PENDING     Smear Review PENDING     nRBC PENDING  0 /100 WBC   Metamyelocytes Relative PENDING     Myelocytes PENDING     Promyelocytes Absolute PENDING     Blasts PENDING    APTT     Status: Normal   Collection Time   12/17/12  4:50 AM      Component Value Range   aPTT 36  24 - 37 seconds  POCT I-STAT 3, BLOOD GAS (G3+)     Status: Abnormal   Collection Time   12/17/12  4:55 AM      Component Value Range   pH, Arterial 7.405  7.350 -  7.450   pCO2 arterial 41.2  35.0 - 45.0 mmHg   pO2, Arterial 64.0 (*) 80.0 - 100.0 mmHg   Bicarbonate 25.9 (*) 20.0 - 24.0 mEq/L    TCO2 27  0 - 100 mmol/L   O2 Saturation 92.0     Acid-Base Excess 1.0  0.0 - 2.0 mmol/L   Patient temperature 98.3 F     Collection site ARTERIAL LINE     Drawn by Nurse     Sample type ARTERIAL       Studies/Results: @[id number] @     . antiseptic oral rinse  15 mL Mouth Rinse QID  . chlorhexidine  15 mL Mouth Rinse BID  . heparin  5,000 Units Subcutaneous Q8H  . hydrocortisone sodium succinate  50 mg Intravenous Q6H  . imipenem-cilastatin  250 mg Intravenous Q6H  . micafungin (MYCAMINE) IV  100 mg Intravenous Daily  . pantoprazole (PROTONIX) IV  40 mg Intravenous Daily  . vancomycin  2,000 mg Intravenous Q48H     Assessment/Plan: s/p Procedure(s): EXPLORATORY LAPAROTOMY  30 hours postop laparotomy, extensive lysis of adhesions, resect multiple hernia sacs, small bowel resection, placement negative pressure wound dressing for strangulated, perforated ventral incisional hernia.  Surgically, the wound looks clean. No evidence of abdominal compartment syndrome.  The patient needs to be returned to the operating room either this afternoon or tomorrow for change of abdominal back and attempted fascial closure. Will discuss with CCM this morning regarding timing as it relates to assessment of stability.I had a conference with the family yesterday afternoon and they seem to understand all these issues well.  Septic shock, Hemodynamics improving, weaning pressors.. On Pincus Sanes and Zosyn.   VDRF, Gas exchange and compliance seems better.   Acute oliguric renal failure due to septic shock.  This now seems to be resolving .  DM - see insulin orders.   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     LOS: 2 days    Latysha Thackston M. Derrell Lolling, M.D., [facility] General and Minimally invasive Surgery Breast and Colorectal Surgery Office:   [phone] Pager:   [phone]  12/17/2012  . .prob

## 2012-12-17 NOTE — Consult Note (Signed)
PULMONARY  / CRITICAL CARE MEDICINE  Name: Erin Schultz MRN: [id number] DOB: [birth year]    ADMISSION DATE:  12/15/2012 CONSULTATION DATE:  12/16/12  REFERRING MD :  Donell Beers (CCS)  CHIEF COMPLAINT:  Abdominal pain  BRIEF PATIENT DESCRIPTION: 47 y/o morbidly obese female was admitted on 12/15/12 with an incarcerated abdominal wall hernia and perforated small bowel requiring emergent surgery.  PCCM consulted for post op vent management.  SIGNIFICANT EVENTS / STUDIES:  9/3 CT abdomen>> large anterior abdominal wall hernia, perforation, and complex fluid collection deep to the bowel; small L kidney, likely poorly functioning R kidney given lack of contrast.  Fatty infiltration of liver 9/3 >> ex-lap, lysis of adhesions, perforation repair, 25cm of bowel resection 9/4- ARF, limited urine output  LINES / TUBES: 2/3 ETT >> 2/3 R IJ CVL >> 2/3 radial a-line >> 2/4 left ij HD>>>  CULTURES: 2/4 blood >> 2/3 urine >>  ANTIBIOTICS: 2/4 Ertapenem >>2/4 2/4 Myco>>> 2/4 vanc>>> 2/4 Imi>>>  SUBJECTIVE: made urine, off levophed  VITAL SIGNS: Temp:  [97.4 F (36.3 C)-100.2 F (37.9 C)] 97.4 F (36.3 C) (02/05 0814) Pulse Rate:  [88-113] 102  (02/05 0400) Resp:  [17-32] 22  (02/05 0800) BP: (86-128)/(44-85) 110/44 mmHg (02/05 0800) SpO2:  [93 %-100 %] 100 % (02/05 0800) Arterial Line BP: (72-152)/(44-75) 146/67 mmHg (02/05 0800) FiO2 (%):  [40 %-60 %] 40 % (02/05 0345) Weight:  [153.5 kg (338 lb 6.5 oz)] 153.5 kg (338 lb 6.5 oz) (02/05 0200) HEMODYNAMICS: CVP:  [8 mmHg-18 mmHg] 9 mmHg VENTILATOR SETTINGS: Vent Mode:  [-] PRVC FiO2 (%):  [40 %-60 %] 40 % Set Rate:  [14 bmp] 14 bmp Vt Set:  [550 mL] 550 mL PEEP:  [8 cmH20-10 cmH20] 8 cmH20 Plateau Pressure:  [21 cmH20-29 cmH20] 25 cmH20 INTAKE / OUTPUT: Intake/Output      02/04 0701 - 02/05 0700 02/05 0701 - 02/06 0700   I.V. (mL/kg) 1746.3 (11.4)    NG/GT 180    IV Piggyback 900    Total Intake(mL/kg) 2826.3 (18.4)    Urine  (mL/kg/hr) 2430 (0.7)    Emesis/NG output 100    Drains 400    Other 771    Total Output 3701    Net -874.7           PHYSICAL EXAMINATION:  Gen: morbidly obese, rass 0, follows commands HEENT:  Obese, ett PULM: CTA , distant CV: tachy, regular, no mgr, no JVD AB: no bowel sounds, anterior wound with wound vac in place, open Ext: cool, pitting edema in legs Derm: no rash or skin breakdown apart from surgical wound Neuro: sedated on vent, rass at goal   LABS:  Lab 12/17/12 0455 12/17/12 0450 12/16/12 1710 12/16/12 1614 12/16/12 0505 12/16/12 0353 12/16/12 0300 12/15/12 2022 12/15/12 1323  HGB -- 9.1* -- 10.7* -- -- 10.4* -- --  WBC -- 18.9* -- 20.4* -- -- 19.3* -- --  PLT -- 224 -- 286 -- -- 306 -- --  NA -- 138 -- 135 -- -- 133* -- --  K -- 3.7 -- 3.8 -- -- -- -- --  CL -- 102 -- 100 -- -- 97 -- --  CO2 -- 24 -- 21 -- -- 22 -- --  GLUCOSE -- 149* -- 225* -- -- 258* -- --  BUN -- 17 -- 25* -- -- 28* -- --  CREATININE -- 1.08 -- 1.95* -- -- 2.51* -- --  CALCIUM -- 8.0* -- 8.1* -- -- 8.3* -- --  MG -- 1.6 -- -- -- -- -- -- --  PHOS -- 2.4 -- 2.9 -- -- -- -- --  AST -- 22 -- -- -- -- -- -- 21  ALT -- 20 -- -- -- -- -- -- 20  ALKPHOS -- 83 -- -- -- -- -- -- 92  BILITOT -- 0.4 -- -- -- -- -- -- 0.6  PROT -- 6.1 -- -- -- -- -- -- 8.0  ALBUMIN -- 2.0* -- 2.4* -- -- -- -- 3.1*  APTT -- 36 -- -- -- -- -- -- --  INR -- 1.35 -- -- -- -- -- 1.20 --  LATICACIDVEN -- -- 1.3 -- -- -- -- -- --  TROPONINI -- -- -- -- -- -- <0.30 -- --  PROCALCITON -- 36.83 52.08 -- -- -- -- -- --  PROBNP -- -- -- -- -- -- -- -- --  O2SATVEN -- -- -- -- -- -- -- -- --  PHART 7.405 -- -- -- 7.383 7.435 -- -- --  PCO2ART 41.2 -- -- -- 32.8* 29.4* -- -- --  PO2ART 64.0* -- -- -- 63.0* 46.0* -- -- --    Lab 12/17/12 0659 12/17/12 0551 12/17/12 0448 12/17/12 0332 12/17/12 0228  GLUCAP 105* 126* 142* 145* 126*    2/5 CXR: bilateral atelectasis, lines wnl, r./o retrocardiac infiltrates  ASSESSMENT  / PLAN:  PULMONARY A: 1) Post op respiratory failure P:   -abg reviewed, reduce TV  -sbt planned, cpap 5 ps 5, goal 1 hr -not extubatable until closed -follow LLL in am pcxr -abg in am  -allow neg balance  CARDIOVASCULAR A:  1) Shock, presumably septic P:  -EGDT completed, really improved with dobutamine -wean vaso to MAP goal 65 -dobutamine to dc at 24 hrs , then follow urine output -echo awaited -stress steroids to remain  RENAL A:   1) Oliguric renal failure due to sepsis, Major improved today, insensible losses from open abdo P:   -agree, hold cvvhd -chem at 1500 -even to pos goals, cvp to 9, may bolus  GASTROINTESTINAL A:   1) Abdominal hernia, bowel strangulation and perforation now s/p exlap, partial small bowel resection P:   -today now, off pressors, vent minimal, this is likely the best window of opportunity to return to OR for closure -ppi -LFt reviewed  HEMATOLOGIC A:   1) MIld anemia P:  -sub q hep -cbc in am  -pre op coags wnl  INFECTIOUS A:   1) Septic shock from peritonitis; CT abdomen with fluid collection worrisome for abscess, but apparently not seen in OR (not noted in op note) P:   Maintain empiric imi, vanc, myco -follow cultures  ENDOCRINE A:  1) DM2 P:   -ICU hyperglycemia protocol -controlled  NEUROLOGIC A:   1) Post op pain control 2) Vent synchrony P:   -fentanyl gtt -versed gtt, Await post op to dc -wua successful  Thromboprophylaxis: sub q hep HOB >30 degrees Ulcer prophylaxis: Pantoprazole Family updated, mom on phone   TODAY'S SUMMARY: 47 y/o morbidly obese diabetic female with septic shock from an incarcerated and perforated small bowel due to an abdominal wall hernia. MODS, did well overnight, off levophed, making urine, vent needs reduced. Suggest back to OR for closure with such improvements.  I have personally obtained a history, examined the patient, evaluated laboratory and imaging results, formulated  the assessment and plan and placed orders. CRITICAL CARE: The patient is critically ill with multiple organ systems failure and requires high complexity  decision making for assessment and support, frequent evaluation and titration of therapies, application of advanced monitoring technologies and extensive interpretation of multiple databases. Critical Care Time devoted to patient care services described in this note is 40 minutes.   Rory Percy J.,MD Pulmonary and Critical Care Medicine [facility] Pager: [phone]  12/17/2012, 8:40 AM

## 2012-12-17 NOTE — ED Provider Notes (Signed)
Medical screening examination/treatment/procedure(s) were conducted as a shared visit with non-physician practitioner(s) and myself.  I personally evaluated the patient during the encounter  Concerning for incarcerated hernia, with SBO with perforated viscus. NPO, IV zosyn, spoke with Dr Donell Beers, [facility] who will evaluate the patient at the bedside  Lyanne Co, MD 12/17/12 [phone]

## 2012-12-17 NOTE — Progress Notes (Signed)
Subjective:  Awake and alert Now making great urine! On CRRT since yesterday keeping volume even with all 4K dialysate  Objective:     Vital signs in last 24 hours: Filed Vitals:   12/17/12 0600 12/17/12 0700 12/17/12 0800 12/17/12 0814  BP: 107/52 123/67 110/44   Pulse:      Temp:    97.4 F (36.3 C)  TempSrc:   Oral Oral  Resp: 20 20 22    Height:      Weight:      SpO2:  100% 100%    Weight change: -4.4 kg (-9 lb 11.2 oz)  Intake/Output Summary (Last 24 hours) at 12/17/12 0839 Last data filed at 12/17/12 0700  Gross per 24 hour  Intake 2669.81 ml  Output   3701 ml  Net -1031.19 ml  urine output 2430  Physical Exam:  Blood pressure 110/44, pulse 102, temperature 97.4 F (36.3 C), temperature source Oral, resp. rate 22, height 5\' 7"  (1.702 m), weight 153.5 kg (338 lb 6.5 oz), SpO2 100.00%. CVP 9  ETT in place, radial aline on right, right IJ central line  Gen: MO AAF Awake, follows commands  Skin: no rash, cyanosis  Neck: cannot see neck veins d/t large beefy neck  Lines in place  Chest: ant fairly clear  Heart: regular, no rub or gallop  Abdomen: obese. Large midline wound VAC in place with bloody drainage  Ext: Massive ext's but difficult to discern if any edema  Neuro: alert, Ox3, no focal deficit  Heme/Lymph: no bruising or LAN  Labs:   Lab 12/17/12 0450 12/16/12 1614 12/16/12 0300 12/16/12 0138 12/16/12 0133 12/16/12 0119 12/16/12 0006 12/15/12 1323  NA 138 135 133* -- 135 131* 133* 133*  K 3.7 3.8 3.9 -- 3.6 3.9 4.1 4.3  CL 102 100 97 -- -- -- -- 96  CO2 24 21 22  -- -- -- -- 23  GLUCOSE 149* 225* 258* 237* -- -- 204* 214*  BUN 17 25* 28* -- -- -- -- 24*  CREATININE 1.08 1.95* 2.51* -- -- -- -- 1.64*  ALB -- -- -- -- -- -- -- --  CALCIUM 8.0* 8.1* 8.3* -- -- -- -- 9.8  PHOS 2.4 2.9 -- -- -- -- -- --     Lab 12/17/12 0450 12/16/12 1614 12/15/12 1323  AST 22 -- 21  ALT 20 -- 20  ALKPHOS 83 -- 92  BILITOT 0.4 -- 0.6  PROT 6.1 -- 8.0  ALBUMIN 2.0*  2.4* 3.1*    Lab 12/15/12 1323  LIPASE 41  AMYLASE --   No results found for this basename: AMMONIA:3 in the last 168 hours   Lab 12/17/12 0450 12/16/12 1614 12/16/12 0300 12/16/12 0133 12/15/12 1323  WBC 18.9* 20.4* 19.3* -- 25.0*  NEUTROABS 15.7* -- -- -- 19.9*  HGB 9.1* 10.7* 10.4* 9.9* --  HCT 26.7* 31.2* 30.7* 29.0* --  MCV 82.2 82.3 82.7 -- 82.8  PLT 224 286 306 -- 394    @labrcntip (inr:5)   Lab 12/16/12 0300  CKTOTAL --  CKMB --  CKMBINDEX --  TROPONINI <0.30     Lab 12/17/12 0659 12/17/12 0551 12/17/12 0448 12/17/12 0332 12/17/12 0228  GLUCAP 105* 126* 142* 145* 126*     No results found for this basename: IRON:30,TIBC:30,TRANSFERRIN:30,FERRITIN:30 in the last 168 hours  Studies/Results: Ct Abdomen Pelvis W Contrast  12/15/2012  *RADIOLOGY REPORT*  Clinical Data: Left lower quadrant pain with nausea over past 6 days.  CT ABDOMEN AND PELVIS WITH CONTRAST  Technique:  Multidetector CT imaging of the abdomen and pelvis was performed following the standard protocol during bolus administration of intravenous contrast.  Contrast: 80mL OMNIPAQUE IOHEXOL 300 MG/ML  SOLN .  Comparison: 04/10/2011 plain film exam.  No comparison CT.  Findings: The present examination do not include the patients entire abdomen secondary to patient's size.  The maximal field of view was utilized.  Large anterior abdominal wall hernia through which fat and small bowel traverses.  This is causing small bowel obstruction with perforated bowel with contained complex air fluid collection deep to the hernia extending into the lower abdomen and upper pelvis.  Small scarred left kidney.  Right kidney poorly functioning as on delayed imaging of contrast is not seen within the collecting system.  Fatty infiltration of the liver without focal mass.  Post cholecystectomy.  No focal splenic, pancreatic or adrenal lesion.  No abdominal aortic aneurysm.  Minimal atelectatic changes lung bases.  Decompressed urinary  bladder without gross abnormality.  Appearance of prior hysterectomy.  Degenerative changes thoracic and lumbar spine without bony destructive lesion.  IMPRESSION: The present examination do not include the patients entire abdomen secondary to patient's size.  The maximal field of view was utilized.  Large anterior abdominal wall hernia through which fat and small bowel traverses.  This is causing small bowel obstruction with perforated bowel with contained complex air fluid collection deep to the hernia extending into the lower abdomen and upper pelvis.  Small scarred left kidney.  Right kidney poorly functioning as on delayed imaging of contrast is not seen within the collecting system.  Fatty infiltration of the liver.  Critical Value/emergent results were called by telephone at the time of interpretation on 12/15/2012 at 7:58 p.m. to Felicie Morn physician's assistant, who verbally acknowledged these results.   Original Report Authenticated By: Lacy Duverney, M.D.    Dg Chest Port 1 View  12/17/2012  *RADIOLOGY REPORT*  Clinical Data: Perforated bowel  PORTABLE CHEST - 1 VIEW  Comparison: 12/16/2012  Findings: Endotracheal tube, NG tube, right internal jugular vein center venous catheter, left internal jugular vein dialysis catheter are stable.  Increased bibasilar airspace disease. Increased vascular congestion.  No pneumothorax.  IMPRESSION: Increased bibasilar airspace disease and vascular congestion.   Original Report Authenticated By: Jolaine Click, M.D.    Dg Chest Port 1 View  12/16/2012  *RADIOLOGY REPORT*  Clinical Data: dialysis catheter placement  PORTABLE CHEST - 1 VIEW  Comparison: The 12/16/2012 at 0246 hours  Findings: Right internal jugular central line, NG tube, and endotracheal tube stable.  A larger lumen left internal jugular central line has been placed since the prior study with tip projecting over the brachial cephalic vein.  No pneumothorax. Bibasilar opacities may represent atelectasis.   IMPRESSION: Left internal jugular central line placement as described above with no pneumothorax.   Original Report Authenticated By: Esperanza Heir, M.D.    Dg Chest Port 1 View  12/16/2012  *RADIOLOGY REPORT*  Clinical Data: Endotracheal tube position.  Line placement.  PORTABLE CHEST - 1 VIEW  Comparison: None.  Findings: Endotracheal tube tip appears to be at or no more than 5 mm above the carina.  Enteric tube tip is in the left upper quadrant consistent with location in the upper stomach.  Right central venous catheter tip overlies the mid SVC region.  No pneumothorax.  Shallow inspiration with atelectasis in the lung bases.  Mild cardiac enlargement with borderline pulmonary vascular congestion.  IMPRESSION: Endotracheal tube tip is at or  just above the carina.  Enteric tube and right central venous catheter locations appear appropriate. Atelectasis in the lung bases with shallow inspiration.  Results were telephoned to the surgical ICU at 0255 hours on 12/16/2012.  I spoke with Revonda Standard.   Original Report Authenticated By: Burman Nieves, M.D.        . sodium chloride 1,000 mL (12/17/12 0700)  . sodium chloride    . DOBUTamine 5 mcg/kg/min (12/17/12 0700)  . fentaNYL infusion INTRAVENOUS 175 mcg/hr (12/17/12 0700)  . insulin (NOVOLIN-R) infusion 2.3 mL/hr at 12/17/12 0700  . midazolam (VERSED) infusion 3 mg/hr (12/17/12 0700)  . norepinephrine (LEVOPHED) Adult infusion Stopped (12/17/12 0300)  . dialysis replacement fluid (prismasate) 200 mL/hr at 12/17/12 8657  . dialysis replacement fluid (prismasate)    . dialysate (PRISMASATE) 1,500 mL/hr at 12/17/12 0832  . vasopressin (PITRESSIN) infusion - *FOR SHOCK* 0.03 Units/min (12/16/12 0406)  . vasopressin (PITRESSIN) infusion - *FOR SHOCK* 0.03 Units/min (12/17/12 0700)      . antiseptic oral rinse  15 mL Mouth Rinse QID  . chlorhexidine  15 mL Mouth Rinse BID  . heparin  5,000 Units Subcutaneous Q8H  . hydrocortisone sodium succinate   50 mg Intravenous Q6H  . imipenem-cilastatin  250 mg Intravenous Q6H  . micafungin (MYCAMINE) IV  100 mg Intravenous Daily  . pantoprazole (PROTONIX) IV  40 mg Intravenous Daily  . vancomycin  2,000 mg Intravenous Q48H     I  have reviewed scheduled and prn medications.  Impression  47 y.o. year-old morbidly obese female with a background of HTN, DM and MO. Has baseline CKD (baseline 0.9-1.1 but with small scarred left kidney on CT) who has developed oligoanuric AKI in the setting of repair (2/4) of an incarcerated perforated ventral incisional hernia, lysis of adhesions, 25 cm bowel resection, shock, VDRF and requirement for high dose pressors, ACE prior to admit and IV contrast for CT scan.   1. AKI  Spontaneous improvement in urine output Creatinine of 1 reflects CRRT plus return of some renal function Still on pressors and inotropes low doses With excellent output, feel could hold CRRT Will have to keep close eye on renal function as hemodynamic issues will likely come back into play with re-op etc but OKL for now without RRT  Will follow  Camille Bal, MD [facility] [phone] Pager 12/17/2012, 8:39 AM

## 2012-12-17 NOTE — Op Note (Signed)
Patient Name:           Erin Schultz   Date of Surgery:        12/17/2012  Pre op Diagnosis:      Open abdomen, status post laparotomy, small bowel resection, and debridement of hernia sacs  Post op Diagnosis:    Same  Procedure:                 Planned return to operating room for exploratory laparotomy, replacement of negative pressure abdominal dressing  Surgeon:                     Angelia Mould. Derrell Lolling, M.D., FACS  Assistant:                      None  Operative Indications:   This is a 47 year old morbidly obese woman who has had multiple operations in the past. She presented to this hospital 48 hours ago with abdominal pain, septic shock, and was found to have a strangulated, perforated ventral incisional hernia. She was taken to the operating room and underwent exploration by Dr. Donell Beers. Multiple hernia sacs were debrided and  small bowel resection was performed with washout. The patient was in septic shock and an extremis and the abdomen was packed open with a negative pressure dressing. She has improved over the past 36 hours with return of urine output, improved hemodynamics, improved middle status, and improved ventilatory status, although she is still ventillator dependent. She is brought to the operating room for planned replacement of her negative pressure dressing and assessment of the fascial closure.  Operative Findings:       The small bowel was pink and viable but was extremely edematous. There was no evidence of any purulence or ischemia. There is no evidence of any enteric drainage. The edges of the fascia were 8 or 9 inches apart and there was too much abdominal edema to consider closure at this time. It was felt that attempt any closure would lead to abdominal compartment syndrome. I felt it was best to simply replace the negative pressure dressing and plan a staged abdominal wall closure over the next week or 2  Procedure in Detail:          The patient was brought from the ICU  to the operating room. General anesthesia with muscle paralysis was performed. Surgical time out was performed. The negative pressure dressing was completely removed. The abdomen was prepped and draped in a sterile fashion. I explored the subcutaneous tissue, the fascial edges, and the intra-abdominal contents with findings as described above. I very carefully very gently separated a few loops of small bowel to make sure there was no infection or enteric drainage and there was none. I washed everything out thoroughly and the irrigation fluid was completely clear. There was no chance of closing the fascia and so I simply replaced the negative pressure dressing in the standard fashion. I had a good seal and it compressed down very nicely was hooked up to the negative pressure machine. The patient tolerated the procedure well and remained relatively stable. EBL negligible. Counts correct. Complications none.     Angelia Mould. Derrell Lolling, M.D., FACS General and Minimally Invasive Surgery Breast and Colorectal Surgery  12/17/2012 7:00 PM

## 2012-12-17 NOTE — Anesthesia Preprocedure Evaluation (Signed)
Anesthesia Evaluation    Reviewed: Allergy & Precautions, H&P , NPO status , Patient's Chart, lab work & pertinent test results, Unable to perform ROS - Chart review only  Airway      Comment: On ventilator, Tracheostomy Dental   Pulmonary          Cardiovascular hypertension,     Neuro/Psych negative neurological ROS     GI/Hepatic hiatal hernia,   Endo/Other  diabetes  Renal/GU Renal disease     Musculoskeletal   Abdominal   Peds  Hematology   Anesthesia Other Findings   Reproductive/Obstetrics                           Anesthesia Physical Anesthesia Plan  ASA: IV  Anesthesia Plan: General   Post-op Pain Management:    Induction:   Airway Management Planned:   Additional Equipment:   Intra-op Plan:   Post-operative Plan: Post-operative intubation/ventilation  Informed Consent:   Plan Discussed with:   Anesthesia Plan Comments:         Anesthesia Quick Evaluation

## 2012-12-17 NOTE — Transfer of Care (Signed)
Immediate Anesthesia Transfer of Care Note  Patient: Erin Schultz  Procedure(s) Performed: Procedure(s) (LRB) with comments: ABDOMINAL VACUUM ASSISTED CLOSURE CHANGE (N/A) - EX Lap with vac change  Patient Location: ICU  Anesthesia Type:General  Level of Consciousness: sedated and unresponsive  Airway & Oxygen Therapy: Patient remains intubated per anesthesia plan and Patient placed on Ventilator (see vital sign flow sheet for setting)  Post-op Assessment: Report given to PACU RN and Post -op Vital signs reviewed and stable  Post vital signs: Reviewed and stable  Complications: No apparent anesthesia complications

## 2012-12-17 NOTE — Progress Notes (Signed)
FPTS Social Note  Erin Schultz is a 47 y.o. year old female presenting with PMH of HTN, and DM2, here with an incarcerated hernia leading to SBO and perforation s/p resection and adhesion lysis on 2/3 now intubated and weaned off pressors. She has had CRRT which is now being discontinued due to regaining some renal function with 2L UOP on dobutamine. Her vent needs are improving and surgery plans to take her back to the OR within 24 hours to attempt primary closure vs replacing the wound vac.   She is currently ICU status and being actively managed by CCM- we appreciate their excellent care.  We will follow along and assume primary care when she becomes floor status.   Kevin Fenton, MD 12/16/2012, [phone]

## 2012-12-18 ENCOUNTER — Inpatient Hospital Stay (HOSPITAL_COMMUNITY): Payer: BC Managed Care – PPO

## 2012-12-18 LAB — POCT I-STAT 3, ART BLOOD GAS (G3+)
Bicarbonate: 28.3 mEq/L — ABNORMAL HIGH (ref 20.0–24.0)
Patient temperature: 36.9
pCO2 arterial: 41 mmHg (ref 35.0–45.0)
pH, Arterial: 7.446 (ref 7.350–7.450)
pO2, Arterial: 61 mmHg — ABNORMAL LOW (ref 80.0–100.0)

## 2012-12-18 LAB — GLUCOSE, CAPILLARY
Glucose-Capillary: 100 mg/dL — ABNORMAL HIGH (ref 70–99)
Glucose-Capillary: 100 mg/dL — ABNORMAL HIGH (ref 70–99)
Glucose-Capillary: 111 mg/dL — ABNORMAL HIGH (ref 70–99)
Glucose-Capillary: 113 mg/dL — ABNORMAL HIGH (ref 70–99)
Glucose-Capillary: 113 mg/dL — ABNORMAL HIGH (ref 70–99)
Glucose-Capillary: 122 mg/dL — ABNORMAL HIGH (ref 70–99)
Glucose-Capillary: 124 mg/dL — ABNORMAL HIGH (ref 70–99)
Glucose-Capillary: 126 mg/dL — ABNORMAL HIGH (ref 70–99)
Glucose-Capillary: 128 mg/dL — ABNORMAL HIGH (ref 70–99)
Glucose-Capillary: 128 mg/dL — ABNORMAL HIGH (ref 70–99)
Glucose-Capillary: 97 mg/dL (ref 70–99)

## 2012-12-18 LAB — COMPREHENSIVE METABOLIC PANEL
ALT: 17 U/L (ref 0–35)
Alkaline Phosphatase: 80 U/L (ref 39–117)
BUN: 13 mg/dL (ref 6–23)
CO2: 25 mEq/L (ref 19–32)
Chloride: 106 mEq/L (ref 96–112)
GFR calc Af Amer: 80 mL/min — ABNORMAL LOW (ref >90)
GFR calc non Af Amer: 69 mL/min — ABNORMAL LOW (ref >90)
Glucose, Bld: 131 mg/dL — ABNORMAL HIGH (ref 70–99)
Potassium: 4.1 mEq/L (ref 3.5–5.1)
Sodium: 140 mEq/L (ref 135–145)
Total Bilirubin: 0.2 mg/dL — ABNORMAL LOW (ref 0.3–1.2)

## 2012-12-18 LAB — CBC WITH DIFFERENTIAL/PLATELET
Basophils Absolute: 0 10*3/uL (ref 0.0–0.1)
Basophils Relative: 0 % (ref 0–1)
HCT: 26.5 % — ABNORMAL LOW (ref 36.0–46.0)
MCHC: 33.6 g/dL (ref 30.0–36.0)
Monocytes Absolute: 1.3 10*3/uL — ABNORMAL HIGH (ref 0.1–1.0)
Neutro Abs: 17.9 10*3/uL — ABNORMAL HIGH (ref 1.7–7.7)
Neutrophils Relative %: 87 % — ABNORMAL HIGH (ref 43–77)
Platelets: 248 10*3/uL (ref 150–400)
RDW: 13.5 % (ref 11.5–15.5)

## 2012-12-18 LAB — BLOOD GAS, ARTERIAL
Acid-Base Excess: 1.3 mmol/L (ref 0.0–2.0)
Bicarbonate: 25.4 mEq/L — ABNORMAL HIGH (ref 20.0–24.0)
FIO2: 80 %
Patient temperature: 98.6
TCO2: 26.7 mmol/L (ref 0–100)
pH, Arterial: 7.414 (ref 7.350–7.450)

## 2012-12-18 LAB — PHOSPHORUS: Phosphorus: 2.7 mg/dL (ref 2.3–4.6)

## 2012-12-18 MED ORDER — POTASSIUM CHLORIDE 10 MEQ/100ML IV SOLN
10.0000 meq | INTRAVENOUS | Status: AC
  Administered 2012-12-18 (×2): 10 meq via INTRAVENOUS
  Filled 2012-12-18: qty 200

## 2012-12-18 MED ORDER — FUROSEMIDE 10 MG/ML IJ SOLN
40.0000 mg | Freq: Once | INTRAMUSCULAR | Status: AC
  Administered 2012-12-18: 40 mg via INTRAVENOUS
  Filled 2012-12-18: qty 4

## 2012-12-18 MED ORDER — SODIUM CHLORIDE 0.9 % IV SOLN
500.0000 mg | Freq: Four times a day (QID) | INTRAVENOUS | Status: DC
  Administered 2012-12-18 – 2012-12-19 (×3): 500 mg via INTRAVENOUS
  Filled 2012-12-18 (×5): qty 500

## 2012-12-18 MED ORDER — HEPARIN SODIUM (PORCINE) 5000 UNIT/ML IJ SOLN: 5000.0000 [IU] | Freq: Three times a day (TID) | INTRAMUSCULAR | Status: DC

## 2012-12-18 NOTE — Preoperative (Signed)
Beta Blockers   Reason not to administer Beta Blockers:Not Applicable 

## 2012-12-18 NOTE — Progress Notes (Signed)
1 Day Post-Op  Subjective: Stable overnight.     On low-dose dobutamine. All other pressors  Discontinued.   Vent settings  PEEP up to 10, FiO2 80%. ABG satisfactory. No acidosis.      Urine output excellent. Pain control adequate. Patient awake and cooperative and responsive.   Objective: Vital signs in last 24 hours: Temp:  [97.4 F (36.3 C)-99.3 F (37.4 C)] 98.6 F (37 C) (02/06 0400) Pulse Rate:  [92-118] 95  (02/06 0500) Resp:  [15-31] 27  (02/06 0500) BP: (90-194)/(44-80) 116/62 mmHg (02/06 0500) SpO2:  [90 %-100 %] 97 % (02/06 0500) Arterial Line BP: (84-158)/(47-83) 136/72 mmHg (02/06 0500) FiO2 (%):  [40 %-100 %] 80 % (02/06 0400) Weight:  [343 lb 0.6 oz (155.6 kg)] 343 lb 0.6 oz (155.6 kg) (02/06 0500)    Intake/Output from previous day: 02/05 0701 - 02/06 0700 In: 2752.3 [I.V.:1762.3; NG/GT:90; IV Piggyback:900] Out: 2935 [Urine:2835; Drains:100] Intake/Output this shift: Total I/O In: 1395.2 [I.V.:1105.2; NG/GT:90; IV Piggyback:200] Out: 1075 [Urine:1075]  General appearance: morbidly obese, on the ventilator. Open his eyes and follows commands. Skin warm and dry. Mild distress from pain GI: abdomen generally soft, some abdominal tenderness which seems appropriate for incision. Negative pressure dressing intact with good seal. Drainage very thin watery serosanguineous. No enteric drainage or purulence. No sign of infection.  Lab Results:  Results for orders placed during the hospital encounter of 12/15/12 (from the past 24 hour(s))  GLUCOSE, CAPILLARY     Status: Abnormal   Collection Time   12/17/12  6:59 AM      Component Value Range   Glucose-Capillary 105 (*) 70 - 99 mg/dL  GLUCOSE, CAPILLARY     Status: Abnormal   Collection Time   12/17/12  8:08 AM      Component Value Range   Glucose-Capillary 117 (*) 70 - 99 mg/dL  GLUCOSE, CAPILLARY     Status: Abnormal   Collection Time   12/17/12  9:11 AM      Component Value Range   Glucose-Capillary 108 (*) 70 - 99  mg/dL  GLUCOSE, CAPILLARY     Status: Abnormal   Collection Time   12/17/12 10:28 AM      Component Value Range   Glucose-Capillary 129 (*) 70 - 99 mg/dL  GLUCOSE, CAPILLARY     Status: Abnormal   Collection Time   12/17/12 11:43 AM      Component Value Range   Glucose-Capillary 100 (*) 70 - 99 mg/dL  GLUCOSE, CAPILLARY     Status: Normal   Collection Time   12/17/12 12:57 PM      Component Value Range   Glucose-Capillary 87  70 - 99 mg/dL  GLUCOSE, CAPILLARY     Status: Abnormal   Collection Time   12/17/12  2:02 PM      Component Value Range   Glucose-Capillary 104 (*) 70 - 99 mg/dL  BASIC METABOLIC PANEL     Status: Abnormal   Collection Time   12/17/12  3:00 PM      Component Value Range   Sodium 137  135 - 145 mEq/L   Potassium 3.4 (*) 3.5 - 5.1 mEq/L   Chloride 103  96 - 112 mEq/L   CO2 25  19 - 32 mEq/L   Glucose, Bld 108 (*) 70 - 99 mg/dL   BUN 14  6 - 23 mg/dL   Creatinine, Ser 9.60  0.50 - 1.10 mg/dL   Calcium 7.7 (*) 8.4 - 10.5  mg/dL   GFR calc non Af Amer 69 (*) >90 mL/min   GFR calc Af Amer 80 (*) >90 mL/min  GLUCOSE, CAPILLARY     Status: Abnormal   Collection Time   12/17/12  3:06 PM      Component Value Range   Glucose-Capillary 104 (*) 70 - 99 mg/dL  GLUCOSE, CAPILLARY     Status: Normal   Collection Time   12/17/12  4:17 PM      Component Value Range   Glucose-Capillary 86  70 - 99 mg/dL  GLUCOSE, CAPILLARY     Status: Normal   Collection Time   12/17/12  5:31 PM      Component Value Range   Glucose-Capillary 87  70 - 99 mg/dL  GLUCOSE, CAPILLARY     Status: Abnormal   Collection Time   12/17/12  7:48 PM      Component Value Range   Glucose-Capillary 128 (*) 70 - 99 mg/dL  GLUCOSE, CAPILLARY     Status: Abnormal   Collection Time   12/17/12  9:45 PM      Component Value Range   Glucose-Capillary 135 (*) 70 - 99 mg/dL  GLUCOSE, CAPILLARY     Status: Abnormal   Collection Time   12/17/12 10:40 PM      Component Value Range   Glucose-Capillary 112 (*) 70 - 99  mg/dL  GLUCOSE, CAPILLARY     Status: Abnormal   Collection Time   12/17/12 11:46 PM      Component Value Range   Glucose-Capillary 106 (*) 70 - 99 mg/dL  GLUCOSE, CAPILLARY     Status: Abnormal   Collection Time   12/18/12  1:44 AM      Component Value Range   Glucose-Capillary 104 (*) 70 - 99 mg/dL  GLUCOSE, CAPILLARY     Status: Abnormal   Collection Time   12/18/12  2:45 AM      Component Value Range   Glucose-Capillary 122 (*) 70 - 99 mg/dL  BLOOD GAS, ARTERIAL     Status: Abnormal   Collection Time   12/18/12  3:36 AM      Component Value Range   FIO2 80.00     Delivery systems VENTILATOR     Mode PRESSURE REGULATED VOLUME CONTROL     VT 430     Rate 14.0     Peep/cpap 5.0     pH, Arterial 7.414  7.350 - 7.450   pCO2 arterial 40.5  35.0 - 45.0 mmHg   pO2, Arterial 61.9 (*) 80.0 - 100.0 mmHg   Bicarbonate 25.4 (*) 20.0 - 24.0 mEq/L   TCO2 26.7  0 - 100 mmol/L   Acid-Base Excess 1.3  0.0 - 2.0 mmol/L   O2 Saturation 93.0     Patient temperature 98.6     Collection site A-LINE     Drawn by [phone]     Sample type ARTERIAL    COMPREHENSIVE METABOLIC PANEL     Status: Abnormal   Collection Time   12/18/12  3:40 AM      Component Value Range   Sodium 140  135 - 145 mEq/L   Potassium 4.1  3.5 - 5.1 mEq/L   Chloride 106  96 - 112 mEq/L   CO2 25  19 - 32 mEq/L   Glucose, Bld 131 (*) 70 - 99 mg/dL   BUN 13  6 - 23 mg/dL   Creatinine, Ser 6.96  0.50 - 1.10 mg/dL  Calcium 8.2 (*) 8.4 - 10.5 mg/dL   Total Protein 6.1  6.0 - 8.3 g/dL   Albumin 1.9 (*) 3.5 - 5.2 g/dL   AST 21  0 - 37 U/L   ALT 17  0 - 35 U/L   Alkaline Phosphatase 80  39 - 117 U/L   Total Bilirubin 0.2 (*) 0.3 - 1.2 mg/dL   GFR calc non Af Amer 69 (*) >90 mL/min   GFR calc Af Amer 80 (*) >90 mL/min  CBC WITH DIFFERENTIAL     Status: Abnormal   Collection Time   12/18/12  3:40 AM      Component Value Range   WBC 20.6 (*) 4.0 - 10.5 K/uL   RBC 3.18 (*) 3.87 - 5.11 MIL/uL   Hemoglobin 8.9 (*) 12.0 - 15.0 g/dL    HCT 11.9 (*) 14.7 - 46.0 %   MCV 83.3  78.0 - 100.0 fL   MCH 28.0  26.0 - 34.0 pg   MCHC 33.6  30.0 - 36.0 g/dL   RDW 82.9  56.2 - 13.0 %   Platelets 248  150 - 400 K/uL   Neutrophils Relative 87 (*) 43 - 77 %   Neutro Abs 17.9 (*) 1.7 - 7.7 K/uL   Lymphocytes Relative 7 (*) 12 - 46 %   Lymphs Abs 1.4  0.7 - 4.0 K/uL   Monocytes Relative 6  3 - 12 %   Monocytes Absolute 1.3 (*) 0.1 - 1.0 K/uL   Eosinophils Relative 0  0 - 5 %   Eosinophils Absolute 0.0  0.0 - 0.7 K/uL   Basophils Relative 0  0 - 1 %   Basophils Absolute 0.0  0.0 - 0.1 K/uL  PROCALCITONIN     Status: Normal   Collection Time   12/18/12  3:40 AM      Component Value Range   Procalcitonin 28.34    PHOSPHORUS     Status: Normal   Collection Time   12/18/12  3:40 AM      Component Value Range   Phosphorus 2.7  2.3 - 4.6 mg/dL  GLUCOSE, CAPILLARY     Status: Abnormal   Collection Time   12/18/12  3:41 AM      Component Value Range   Glucose-Capillary 128 (*) 70 - 99 mg/dL  GLUCOSE, CAPILLARY     Status: Abnormal   Collection Time   12/18/12  4:44 AM      Component Value Range   Glucose-Capillary 124 (*) 70 - 99 mg/dL     Studies/Results: @[id number] @     . antiseptic oral rinse  15 mL Mouth Rinse QID  . chlorhexidine  15 mL Mouth Rinse BID  . heparin  5,000 Units Subcutaneous Q8H  . hydrocortisone sodium succinate  50 mg Intravenous Q6H  . imipenem-cilastatin  250 mg Intravenous Q6H  . micafungin (MYCAMINE) IV  100 mg Intravenous Daily  . pantoprazole (PROTONIX) IV  40 mg Intravenous Daily  . sodium chloride  250 mL Intravenous Once  . vancomycin  2,000 mg Intravenous Q24H     Assessment/Plan: s/p Procedure(s): ABDOMINAL VACUUM ASSISTED CLOSURE CHANGE  POD #3, #1. -  Laparotomy, extensive lysis of adhesions, resection multiple hernia sacs, small bowel resection, placement of VAC for strangulated, perforated ventral incisional hernia by Dr. Donell Beers. No evidence of infection, ischemia, or leak on  reexploration last night.  Unable to close the fascia last night due to edema. Will need to plan staged closure over the next  several days.  Plan return to OR tomorrow for dressing change and reexamination. Family aware.  Diurese, if possible to reduce tissue edema.  Septic shock, Intermedics improving, continue to wean the pressors. On  Steroids, Primaxin, vancomycin  VDRF. Gas exchange and compliance and satisfactory. Still at risk for ARDS-like syndrome.  Acute oliguric renal failure due to septic shock, resolving  DM. See insulin drip orders.     LOS: 3 days    Addalyn Speedy M. Derrell Lolling, M.D., [facility] General and Minimally invasive Surgery Breast and Colorectal Surgery Office:   [phone] Pager:   [phone]  12/18/2012  . .prob

## 2012-12-18 NOTE — Plan of Care (Signed)
Problem: Phase I Progression Outcomes Goal: VTE prophylaxis Outcome: Completed/Met Date Met:  12/18/12 SCD's Heparin injection per MD order

## 2012-12-18 NOTE — Progress Notes (Signed)
ANTIBIOTIC CONSULT NOTE - FOLLOW UP  Pharmacy Consult for Primaxin Indication: rule out sepsis  Allergies  Allergen Reactions  . Corn-Containing Products Nausea And Vomiting    Stomach inflamed  . Sulfa Antibiotics     Childhood reaction  Patient Measurements: Height: 5\' 7"  (170.2 cm) Weight: 343 lb 0.6 oz (155.6 kg) IBW/kg (Calculated) : 61.6  Vital Signs: Temp: 98.5 F (36.9 C) (02/06 1221) Temp src: Oral (02/06 1221) BP: 102/68 mmHg (02/06 1300) Pulse Rate: 85  (02/06 1300) Intake/Output from previous day: 02/05 0701 - 02/06 0700 In: 2976.6 [I.V.:1886.6; NG/GT:90; IV Piggyback:1000] Out: 3235 [Urine:3035; Drains:200] Intake/Output from this shift: Total I/O In: 755.6 [I.V.:285.6; NG/GT:60; IV Piggyback:410] Out: 1425 [Urine:1175; Drains:250] Labs:  Basename 12/18/12 0340 12/17/12 1500 12/17/12 0450 12/16/12 1614  WBC 20.6* -- 18.9* 20.4*  HGB 8.9* -- 9.1* 10.7*  PLT 248 -- 224 286  LABCREA -- -- -- --  CREATININE 0.96 0.96 1.08 --   Microbiology: Recent Results (from the past 720 hour(s))  URINE CULTURE     Status: Normal   Collection Time   12/15/12  1:36 PM      Component Value Range Status Comment   Specimen Description URINE, RANDOM   Final    Special Requests NONE   Final    Culture  Setup Time 12/15/2012 14:35   Final    Colony Count 50,000 COLONIES/ML   Final    Culture     Final    Value: Multiple bacterial morphotypes present, none predominant. Suggest appropriate recollection if clinically indicated.   Report Status 12/16/2012 FINAL   Final   MRSA PCR SCREENING     Status: Normal   Collection Time   12/16/12  2:51 AM      Component Value Range Status Comment   MRSA by PCR NEGATIVE  NEGATIVE Final   CULTURE, BLOOD (ROUTINE X 2)     Status: Normal (Preliminary result)   Collection Time   12/16/12  4:20 AM      Component Value Range Status Comment   Specimen Description BLOOD RIGHT HAND   Final    Special Requests BOTTLES DRAWN AEROBIC ONLY 10CC    Final    Culture  Setup Time 12/16/2012 09:48   Final    Culture     Final    Value:        BLOOD CULTURE RECEIVED NO GROWTH TO DATE CULTURE WILL BE HELD FOR 5 DAYS BEFORE ISSUING A FINAL NEGATIVE REPORT   Report Status PENDING   Incomplete   CULTURE, BLOOD (ROUTINE X 2)     Status: Normal (Preliminary result)   Collection Time   12/16/12  7:00 AM      Component Value Range Status Comment   Specimen Description BLOOD THUMB LEFT   Final    Special Requests BOTTLES DRAWN AEROBIC ONLY 4.0CC   Final    Culture  Setup Time 12/16/2012 15:50   Final    Culture     Final    Value:        BLOOD CULTURE RECEIVED NO GROWTH TO DATE CULTURE WILL BE HELD FOR 5 DAYS BEFORE ISSUING A FINAL NEGATIVE REPORT   Report Status PENDING   Incomplete     Anti-infectives     Start     Dose/Rate Route Frequency Ordered Stop   12/18/12 1800   imipenem-cilastatin (PRIMAXIN) 500 mg in sodium chloride 0.9 % 100 mL IVPB        500 mg 200  mL/hr over 30 Minutes Intravenous 4 times per day 12/18/12 1402     12/18/12 0900   vancomycin (VANCOCIN) 2,000 mg in sodium chloride 0.9 % 500 mL IVPB  Status:  Discontinued        2,000 mg 250 mL/hr over 120 Minutes Intravenous Every 48 hours 12/16/12 0927 12/17/12 1236   12/17/12 1400   vancomycin (VANCOCIN) 2,000 mg in sodium chloride 0.9 % 500 mL IVPB  Status:  Discontinued        2,000 mg 250 mL/hr over 120 Minutes Intravenous Every 24 hours 12/17/12 1236 12/18/12 1005   12/17/12 1000   micafungin (MYCAMINE) 100 mg in sodium chloride 0.9 % 100 mL IVPB        100 mg 100 mL/hr over 1 Hours Intravenous Daily 12/16/12 0908     12/16/12 2300   ertapenem (INVANZ) 1 g in sodium chloride 0.9 % 50 mL IVPB  Status:  Discontinued        1 g 100 mL/hr over 30 Minutes Intravenous Every 24 hours 12/16/12 0230 12/16/12 0908   12/16/12 1200   imipenem-cilastatin (PRIMAXIN) 250 mg in sodium chloride 0.9 % 100 mL IVPB  Status:  Discontinued        250 mg 200 mL/hr over 30 Minutes  Intravenous 4 times per day 12/16/12 0927 12/18/12 1402   12/16/12 1000   micafungin (MYCAMINE) 100 mg in sodium chloride 0.9 % 100 mL IVPB        100 mg 100 mL/hr over 1 Hours Intravenous Daily 12/16/12 0908 12/16/12 1239   12/16/12 0930   vancomycin (VANCOCIN) 2,500 mg in sodium chloride 0.9 % 500 mL IVPB        2,500 mg 250 mL/hr over 120 Minutes Intravenous  Once 12/16/12 0927 12/16/12 1334   12/16/12 0600   ertapenem (INVANZ) 1 g in sodium chloride 0.9 % 50 mL IVPB        1 g 100 mL/hr over 30 Minutes Intravenous On call to O.R. 12/15/12 2129 12/15/12 2254   12/15/12 2015  piperacillin-tazobactam (ZOSYN) IVPB 3.375 g       3.375 g 12.5 mL/hr over 240 Minutes Intravenous  Once 12/15/12 2006 12/16/12 0034         Assessment: 46 YOF admitted with septic shock from peritonitis on day #3 of Primaxin and Micafungin. WBC is up at 20.6. Patient is afebrile. Cultures are ngtd. Renal function has improved to baseline after 2 days of CRRT.   Goal of Therapy:  Clinical resolution of infection  Plan:  1. Increase Primaxin to 500mg  IV q6h due to improved renal function. 2. Continue to follow-up cultures and clinical status.   Fayne Norrie 12/18/2012,2:03 PM

## 2012-12-18 NOTE — Progress Notes (Signed)
I was called by respiratory therapy; the patient had an episode of hypoxia;   Recovered with recruting maneuver performed.   Increase PEEP to 10

## 2012-12-18 NOTE — Plan of Care (Signed)
Problem: Phase I Progression Outcomes Goal: Oral Care per Protocol Outcome: Completed/Met Date Met:  12/18/12 Q2 hr oral care and PRN

## 2012-12-18 NOTE — Consult Note (Signed)
PULMONARY  / CRITICAL CARE MEDICINE  Name: Erin Schultz MRN: [id number] DOB: Feb 24, 1966    ADMISSION DATE:  12/15/2012 CONSULTATION DATE:  12/16/12  REFERRING MD :  Donell Beers ([facility])  CHIEF COMPLAINT:  Abdominal pain  BRIEF PATIENT DESCRIPTION: 47 y/o morbidly obese female was admitted on 12/15/12 with an incarcerated abdominal wall hernia and perforated small bowel requiring emergent surgery.  PCCM consulted for post op vent management.  SIGNIFICANT EVENTS / STUDIES:  2/3 CT abdomen>> large anterior abdominal wall hernia, perforation, and complex fluid collection deep to the bowel; small L kidney, likely poorly functioning R kidney given lack of contrast.  Fatty infiltration of liver 2/3 >> ex-lap, lysis of adhesions, perforation repair, 25cm of bowel resection 2/4- ARF, limited urine output 2/5 -OR for reexploration - No evidence of infection, ischemia, or leak. Unable to close the fascia due to edema.   LINES / TUBES: 2/3 ETT >> 2/3 R IJ CVL >> 2/3 radial a-line >> 2/4 left ij HD>>>  CULTURES: 2/4 blood >> 2/3 urine >>  ANTIBIOTICS: 2/4 Ertapenem >>2/4 2/4 Micafungin>>> 2/4 vancomycin>>> 2/4 Imipenem>>>  SUBJECTIVE: Urine output remains acceptable, off pressors.  VITAL SIGNS: Temp:  [97.9 F (36.6 C)-99.3 F (37.4 C)] 98.1 F (36.7 C) (02/06 0838) Pulse Rate:  [92-118] 97  (02/06 1000) Resp:  [15-31] 26  (02/06 1000) BP: (90-194)/(45-80) 130/66 mmHg (02/06 1000) SpO2:  [90 %-100 %] 98 % (02/06 1000) Arterial Line BP: (84-158)/(47-83) 138/71 mmHg (02/06 1000) FiO2 (%):  [40 %-100 %] 80 % (02/06 0900) Weight:  [155.6 kg (343 lb 0.6 oz)] 155.6 kg (343 lb 0.6 oz) (02/06 0500) HEMODYNAMICS: CVP:  [13 mmHg-19 mmHg] 18 mmHg VENTILATOR SETTINGS: Vent Mode:  [-] PRVC FiO2 (%):  [40 %-100 %] 80 % Set Rate:  [14 bmp] 14 bmp Vt Set:  [430 mL] 430 mL PEEP:  [5 cmH20-10 cmH20] 10 cmH20 Plateau Pressure:  [16 cmH20-30 cmH20] 30 cmH20 INTAKE / OUTPUT: Intake/Output      02/05  0701 - 02/06 0700 02/06 0701 - 02/07 0700   I.V. (mL/kg) 1886.6 (12.1) 131.1 (0.8)   NG/GT 90 30   IV Piggyback 1000 110   Total Intake(mL/kg) 2976.6 (19.1) 271.1 (1.7)   Urine (mL/kg/hr) 3035 (0.8) 125   Emesis/NG output     Drains 200 150   Other 0    Total Output 3235 275   Net -258.4 -4          PHYSICAL EXAMINATION:  Gen: Morbidly obese, rass 0 HEENT:  Obese, EET, EOMs intact, moist MM PULM: Clear throughout , diminished in bases CV: tachy, regular, no mgr, no JVD AB: no bowel sounds, anterior wound with wound vac in place, open Ext: cool, pitting edema in legs Derm: no rash or skin breakdown apart from surgical wound Neuro: sedated on vent, rass at goal, follows commands. No focal deficits   LABS:  Lab 12/18/12 0340 12/18/12 0336 12/17/12 1500 12/17/12 0455 12/17/12 0450 12/16/12 1710 12/16/12 1614 12/16/12 0505 12/16/12 0300 12/15/12 2022 12/15/12 1323  HGB 8.9* -- -- -- 9.1* -- 10.7* -- -- -- --  WBC 20.6* -- -- -- 18.9* -- 20.4* -- -- -- --  PLT 248 -- -- -- 224 -- 286 -- -- -- --  NA 140 -- 137 -- 138 -- -- -- -- -- --  K 4.1 -- 3.4* -- -- -- -- -- -- -- --  CL 106 -- 103 -- 102 -- -- -- -- -- --  CO2 25 --  25 -- 24 -- -- -- -- -- --  GLUCOSE 131* -- 108* -- 149* -- -- -- -- -- --  BUN 13 -- 14 -- 17 -- -- -- -- -- --  CREATININE 0.96 -- 0.96 -- 1.08 -- -- -- -- -- --  CALCIUM 8.2* -- 7.7* -- 8.0* -- -- -- -- -- --  MG -- -- -- -- 1.6 -- -- -- -- -- --  PHOS 2.7 -- -- -- 2.4 -- 2.9 -- -- -- --  AST 21 -- -- -- 22 -- -- -- -- -- 21  ALT 17 -- -- -- 20 -- -- -- -- -- 20  ALKPHOS 80 -- -- -- 83 -- -- -- -- -- 92  BILITOT 0.2* -- -- -- 0.4 -- -- -- -- -- 0.6  PROT 6.1 -- -- -- 6.1 -- -- -- -- -- 8.0  ALBUMIN 1.9* -- -- -- 2.0* -- 2.4* -- -- -- --  APTT -- -- -- -- 36 -- -- -- -- -- --  INR -- -- -- -- 1.35 -- -- -- -- 1.20 --  LATICACIDVEN -- -- -- -- -- 1.3 -- -- -- -- --  TROPONINI -- -- -- -- -- -- -- -- <0.30 -- --  PROCALCITON 28.34 -- -- -- 36.83 52.08  -- -- -- -- --  PROBNP -- -- -- -- -- -- -- -- -- -- --  O2SATVEN -- -- -- -- -- -- -- -- -- -- --  PHART -- 7.414 -- 7.405 -- -- -- 7.383 -- -- --  PCO2ART -- 40.5 -- 41.2 -- -- -- 32.8* -- -- --  PO2ART -- 61.9* -- 64.0* -- -- -- 63.0* -- -- --    Lab 12/18/12 0926 12/18/12 0805 12/18/12 0649 12/18/12 0547 12/18/12 0444  GLUCAP 113* 128* 126* 118* 124*      ASSESSMENT / PLAN:  PULMONARY A: 1) Post op respiratory failure - Open abdomen, plan to return to OR for reexploration, closure of abdbomen P:   - Will not extubatable until abdomen closed -follow LLL in am pcxr -F/U ABG  -allow neg balance  CARDIOVASCULAR A:  1) Shock, presumably septic - Pressors off with stable BP, EGDT completed P:  -Goal to keep MAP > -dobutamine D/Ced -follow urine output -echo awaited -stress steroids to remain  RENAL A:   1) Oliguric renal failure- likely due to sepsis, improved urine output & SCr P:   -F/u Chem -Monitor urine output -D/c HD cath   GASTROINTESTINAL A:   1) Abdominal hernia, bowel strangulation and perforation now s/p exlap, partial small bowel resection P:   -ppi -LFTs reviewed -Keep NPO  HEMATOLOGIC A:   1) MIld anemia P:  -sub q hep -cbc in am   INFECTIOUS A:   1) Septic shock from peritonitis; CT abdomen with fluid collection worrisome for abscess, but apparently not seen in OR (not noted in op note) P:   - Continue empiric antibiotics  - F/u cultures  ENDOCRINE A:  1) DM2 P:   -ICU hyperglycemia protocol   NEUROLOGIC A:   1) Post op pain control 2) Vent synchrony P:   -fentanyl gtt -versed gtt    I have personally obtained a history, examined the patient, evaluated laboratory and imaging results, formulated the assessment and plan and placed orders. CRITICAL CARE: The patient is critically ill with multiple organ systems failure and requires high complexity decision making for assessment and support, frequent evaluation and  titration of therapies, application of advanced monitoring technologies and extensive interpretation of multiple databases. Critical Care Time devoted to patient care services described in this note is 40 minutes.    Billy Fischer, MD ; [facility] [phone].  After 5:30 PM or weekends, call [phone]

## 2012-12-18 NOTE — Progress Notes (Signed)
FPTS Social Note  Erin Schultz is a 47 y.o. year old female presenting with PMH of HTN, and DM2, here with an incarcerated hernia leading to SBO and perforation s/p resection and adhesion lysis on 2/3 now intubated and weaned off pressors except for dobutamine. She had septic ATN requiring CRRT which is now stopped due to improvement in renal function. Her vent needs are improving and surgery took her back to the OR on 2/5 but were unable to close her abdomen due to gut edema so the woundvac was replaced. They are planning a staged closure with return to OR tomorrow.   She is currently ICU status and being actively managed by CCM- we appreciate their excellent care.  We will follow along and assume primary care when she becomes floor status.   Kevin Fenton, MD 12/16/2012, [phone]

## 2012-12-18 NOTE — Anesthesia Postprocedure Evaluation (Signed)
  Anesthesia Post-op Note  Patient: Erin Schultz  Procedure(s) Performed: Procedure(s) (LRB) with comments: ABDOMINAL VACUUM ASSISTED CLOSURE CHANGE (N/A) - EX Lap with vac change  Patient Location: SICU  Anesthesia Type:General  Level of Consciousness: awake, alert , patient cooperative and Patient remains intubated per anesthesia plan  Airway and Oxygen Therapy: Patient placed on Ventilator (see vital sign flow sheet for setting)  Post-op Pain: none  Post-op Assessment: Post-op Vital signs reviewed, Patient's Cardiovascular Status Stable, Respiratory Function Stable and Pain level controlled  Post-op Vital Signs: Reviewed and stable  Complications: No apparent anesthesia complications

## 2012-12-18 NOTE — Progress Notes (Signed)
UR Completed.  Vangie Bicker [phone] 12/18/2012

## 2012-12-18 NOTE — Plan of Care (Signed)
Problem: Phase I Progression Outcomes Goal: GIProphysixis Outcome: Completed/Met Date Met:  12/18/12 Protonix per MD order

## 2012-12-18 NOTE — Progress Notes (Signed)
Called and talked with Dr. Frederico Hamman and increased Peep to 10cm to help with patients Oxygenation. HR and BP tolerating well. No issues.

## 2012-12-18 NOTE — Progress Notes (Signed)
Records reviewed. Off CRRT since 2/4 Renal function stable with creatinine less than 1 UOP excellent Still has HD cath in  At this point no role for renal Call if recurrent issues

## 2012-12-19 ENCOUNTER — Inpatient Hospital Stay (HOSPITAL_COMMUNITY): Payer: BC Managed Care – PPO

## 2012-12-19 ENCOUNTER — Encounter (HOSPITAL_COMMUNITY): Payer: Self-pay | Admitting: Certified Registered"

## 2012-12-19 ENCOUNTER — Inpatient Hospital Stay (HOSPITAL_COMMUNITY): Payer: BC Managed Care – PPO | Admitting: Certified Registered"

## 2012-12-19 ENCOUNTER — Encounter (HOSPITAL_COMMUNITY)
Admission: EM | Disposition: A | Discharge status: Home Health | Payer: Self-pay | Source: Home / Self Care | Attending: Pulmonary Disease

## 2012-12-19 ENCOUNTER — Encounter (HOSPITAL_COMMUNITY): Payer: Self-pay | Admitting: General Surgery

## 2012-12-19 HISTORY — PX: LAPAROTOMY: SHX154

## 2012-12-19 HISTORY — PX: VACUUM ASSISTED CLOSURE CHANGE: SHX5227

## 2012-12-19 LAB — BLOOD GAS, ARTERIAL
Acid-Base Excess: 0.9 mmol/L (ref 0.0–2.0)
FIO2: 0.8 %
O2 Saturation: 96.3 %
O2 Saturation: 96.4 %
PEEP: 10 cmH2O
Patient temperature: 98.6
RATE: 12 resp/min
TCO2: 26.1 mmol/L (ref 0–100)
pCO2 arterial: 39.5 mmHg (ref 35.0–45.0)
pO2, Arterial: 70.7 mmHg — ABNORMAL LOW (ref 80.0–100.0)
pO2, Arterial: 85.8 mmHg (ref 80.0–100.0)

## 2012-12-19 LAB — CBC
HCT: 26.2 % — ABNORMAL LOW (ref 36.0–46.0)
Hemoglobin: 8.5 g/dL — ABNORMAL LOW (ref 12.0–15.0)
MCHC: 32.5 g/dL (ref 30.0–36.0)
RBC: 3.05 MIL/uL — ABNORMAL LOW (ref 3.87–5.11)
RDW: 13.9 % (ref 11.5–15.5)
RDW: 13.7 % (ref 11.5–15.5)
WBC: 17.5 10*3/uL — ABNORMAL HIGH (ref 4.0–10.5)

## 2012-12-19 LAB — GLUCOSE, CAPILLARY
Glucose-Capillary: 113 mg/dL — ABNORMAL HIGH (ref 70–99)
Glucose-Capillary: 121 mg/dL — ABNORMAL HIGH (ref 70–99)
Glucose-Capillary: 111 mg/dL — ABNORMAL HIGH (ref 70–99)
Glucose-Capillary: 93 mg/dL (ref 70–99)

## 2012-12-19 LAB — CREATININE, SERUM
Creatinine, Ser: 0.95 mg/dL (ref 0.50–1.10)
GFR calc non Af Amer: 70 mL/min — ABNORMAL LOW (ref >90)

## 2012-12-19 LAB — BASIC METABOLIC PANEL
BUN: 16 mg/dL (ref 6–23)
Chloride: 107 mEq/L (ref 96–112)
GFR calc Af Amer: 79 mL/min — ABNORMAL LOW (ref >90)
Potassium: 3.7 mEq/L (ref 3.5–5.1)

## 2012-12-19 SURGERY — LAPAROTOMY, EXPLORATORY
Anesthesia: General | Site: Abdomen | Wound class: Dirty or Infected

## 2012-12-19 MED ORDER — FUROSEMIDE 10 MG/ML IJ SOLN
60.0000 mg | Freq: Two times a day (BID) | INTRAMUSCULAR | Status: AC
  Administered 2012-12-19 – 2012-12-22 (×6): 60 mg via INTRAVENOUS
  Filled 2012-12-19 (×6): qty 6

## 2012-12-19 MED ORDER — 0.9 % SODIUM CHLORIDE (POUR BTL) OPTIME
TOPICAL | Status: DC | PRN
  Administered 2012-12-19: 2000 mL

## 2012-12-19 MED ORDER — ROCURONIUM BROMIDE 100 MG/10ML IV SOLN
INTRAVENOUS | Status: DC | PRN
  Administered 2012-12-19 (×2): 50 mg via INTRAVENOUS

## 2012-12-19 MED ORDER — LACTATED RINGERS IV SOLN
INTRAVENOUS | Status: DC | PRN
  Administered 2012-12-19: 15:00:00 via INTRAVENOUS

## 2012-12-19 MED ORDER — HEPARIN SODIUM (PORCINE) 5000 UNIT/ML IJ SOLN: 5000.0000 [IU] | Freq: Three times a day (TID) | INTRAMUSCULAR | Status: DC

## 2012-12-19 MED ORDER — INSULIN ASPART 100 UNIT/ML ~~LOC~~ SOLN
0.0000 [IU] | SUBCUTANEOUS | Status: DC
  Administered 2012-12-20: 2 [IU] via SUBCUTANEOUS
  Administered 2012-12-21 (×4): 3 [IU] via SUBCUTANEOUS
  Administered 2012-12-21 (×2): 2 [IU] via SUBCUTANEOUS
  Administered 2012-12-22: 5 [IU] via SUBCUTANEOUS
  Administered 2012-12-22 (×2): 3 [IU] via SUBCUTANEOUS
  Administered 2012-12-22: 5 [IU] via SUBCUTANEOUS
  Administered 2012-12-22 (×3): 3 [IU] via SUBCUTANEOUS
  Administered 2012-12-23 (×2): 8 [IU] via SUBCUTANEOUS
  Administered 2012-12-23 (×2): 5 [IU] via SUBCUTANEOUS
  Administered 2012-12-23 – 2012-12-24 (×2): 8 [IU] via SUBCUTANEOUS

## 2012-12-19 MED ORDER — FUROSEMIDE 10 MG/ML IJ SOLN
60.0000 mg | Freq: Once | INTRAMUSCULAR | Status: AC
  Administered 2012-12-19: 60 mg via INTRAVENOUS
  Filled 2012-12-19: qty 6

## 2012-12-19 MED ORDER — SODIUM CHLORIDE 0.9 % IV SOLN
INTRAVENOUS | Status: DC
  Administered 2012-12-20 – 2012-12-25 (×4): via INTRAVENOUS

## 2012-12-19 MED ORDER — PIPERACILLIN-TAZOBACTAM 3.375 G IVPB
3.3750 g | Freq: Three times a day (TID) | INTRAVENOUS | Status: AC
  Administered 2012-12-19 – 2012-12-29 (×32): 3.375 g via INTRAVENOUS
  Filled 2012-12-19 (×34): qty 50

## 2012-12-19 MED ORDER — POTASSIUM CHLORIDE 10 MEQ/50ML IV SOLN
10.0000 meq | INTRAVENOUS | Status: AC
  Administered 2012-12-19 (×2): 10 meq via INTRAVENOUS
  Filled 2012-12-19: qty 100

## 2012-12-19 SURGICAL SUPPLY — 44 items
BLADE SURG ROTATE 9660 (MISCELLANEOUS) IMPLANT
CANISTER SUCT LVC 12 LTR MEDI- (MISCELLANEOUS) ×2 IMPLANT
CANISTER SUCTION 2500CC (MISCELLANEOUS) ×2 IMPLANT
CANISTER WOUND CARE 500ML ATS (WOUND CARE) ×2 IMPLANT
CHLORAPREP W/TINT 26ML (MISCELLANEOUS) ×2 IMPLANT
CLOTH BEACON ORANGE TIMEOUT ST (SAFETY) ×2 IMPLANT
COVER MAYO STAND STRL (DRAPES) IMPLANT
COVER SURGICAL LIGHT HANDLE (MISCELLANEOUS) ×2 IMPLANT
DRAPE LAPAROSCOPIC ABDOMINAL (DRAPES) ×2 IMPLANT
DRAPE UTILITY 15X26 W/TAPE STR (DRAPE) ×4 IMPLANT
DRAPE WARM FLUID 44X44 (DRAPE) ×2 IMPLANT
ELECT BLADE 6.5 EXT (BLADE) IMPLANT
ELECT CAUTERY BLADE 6.4 (BLADE) ×2 IMPLANT
ELECT REM PT RETURN 9FT ADLT (ELECTROSURGICAL) ×2
ELECTRODE REM PT RTRN 9FT ADLT (ELECTROSURGICAL) ×1 IMPLANT
GLOVE BIOGEL PI IND STRL 7.0 (GLOVE) ×1 IMPLANT
GLOVE BIOGEL PI INDICATOR 7.0 (GLOVE) ×1
GLOVE EUDERMIC 7 POWDERFREE (GLOVE) ×2 IMPLANT
GLOVE SURG SS PI 7.0 STRL IVOR (GLOVE) ×2 IMPLANT
GOWN PREVENTION PLUS XLARGE (GOWN DISPOSABLE) ×2 IMPLANT
GOWN STRL NON-REIN LRG LVL3 (GOWN DISPOSABLE) ×2 IMPLANT
KIT BASIN OR (CUSTOM PROCEDURE TRAY) ×2 IMPLANT
KIT ROOM TURNOVER OR (KITS) ×2 IMPLANT
LIGASURE IMPACT 36 18CM CVD LR (INSTRUMENTS) IMPLANT
NS IRRIG 1000ML POUR BTL (IV SOLUTION) ×4 IMPLANT
PACK GENERAL/GYN (CUSTOM PROCEDURE TRAY) ×2 IMPLANT
PAD ARMBOARD 7.5X6 YLW CONV (MISCELLANEOUS) ×4 IMPLANT
PAD SHARPS MAGNETIC DISPOSAL (MISCELLANEOUS) IMPLANT
SPECIMEN JAR X LARGE (MISCELLANEOUS) IMPLANT
SPONGE ABDOMINAL VAC ABTHERA (MISCELLANEOUS) ×2 IMPLANT
SPONGE LAP 18X18 X RAY DECT (DISPOSABLE) IMPLANT
STAPLER VISISTAT 35W (STAPLE) ×2 IMPLANT
SUCTION POOLE TIP (SUCTIONS) ×2 IMPLANT
SUT PDS AB 1 TP1 96 (SUTURE) ×4 IMPLANT
SUT SILK 2 0 SH CR/8 (SUTURE) ×2 IMPLANT
SUT SILK 2 0 TIES 10X30 (SUTURE) ×2 IMPLANT
SUT SILK 3 0 SH CR/8 (SUTURE) ×2 IMPLANT
SUT SILK 3 0 TIES 10X30 (SUTURE) ×2 IMPLANT
SYR BULB IRRIGATION 50ML (SYRINGE) IMPLANT
TOWEL OR 17X24 6PK STRL BLUE (TOWEL DISPOSABLE) ×2 IMPLANT
TOWEL OR 17X26 10 PK STRL BLUE (TOWEL DISPOSABLE) ×2 IMPLANT
TRAY FOLEY CATH 14FRSI W/METER (CATHETERS) IMPLANT
WATER STERILE IRR 1000ML POUR (IV SOLUTION) ×2 IMPLANT
YANKAUER SUCT BULB TIP NO VENT (SUCTIONS) ×2 IMPLANT

## 2012-12-19 NOTE — Plan of Care (Signed)
Problem: Phase I Progression Outcomes Goal: Baseline oxygen/pH stable Outcome: Progressing Decreasing FiO2 as able

## 2012-12-19 NOTE — Consult Note (Signed)
PULMONARY  / CRITICAL CARE MEDICINE  Name: Erin Schultz MRN: [id number] DOB: November 18, 1965    ADMISSION DATE:  12/15/2012 CONSULTATION DATE:  12/16/12  REFERRING MD :  Donell Beers ([facility])  CHIEF COMPLAINT:  Abdominal pain  BRIEF PATIENT DESCRIPTION: 47 y/o morbidly obese female was admitted on 12/15/12 with an incarcerated abdominal wall hernia and perforated small bowel requiring emergent surgery.  PCCM consulted for post op vent management.  SIGNIFICANT EVENTS / STUDIES:  2/3 CT abdomen>> large anterior abdominal wall hernia, perforation, and complex fluid collection deep to the bowel; small L kidney, likely poorly functioning R kidney given lack of contrast.  Fatty infiltration of liver 2/3 >> ex-lap, lysis of adhesions, perforation repair, 25cm of bowel resection 2/4- ARF, limited urine output 2/5 -OR for reexploration - No evidence of infection, ischemia, or leak. Unable to close the fascia due to edema.  2/6- neg balance, hd cath removed  LINES / TUBES: 2/3 ETT >> 2/3 R IJ CVL >> 2/3 radial a-line >> 2/4 left ij HD>>>2/6  CULTURES: 2/4 blood >> 2/3 urine >>contam  ANTIBIOTICS: 2/4 Ertapenem >>2/4 2/4 Micafungin>>>plan stop 2/8 2/4 vancomycin>>>2/6 2/4 Imipenem>>>2/7 2/7 zosyn>>>  SUBJECTIVE: urine output good , with lasix  VITAL SIGNS: Temp:  [98.1 F (36.7 C)-98.8 F (37.1 C)] 98.8 F (37.1 C) (02/07 0808) Pulse Rate:  [80-97] 90  (02/07 0800) Resp:  [23-29] 27  (02/07 0800) BP: (94-130)/(51-78) 113/68 mmHg (02/07 0800) SpO2:  [93 %-98 %] 96 % (02/07 0800) Arterial Line BP: (104-138)/(57-78) 124/71 mmHg (02/07 0800) FiO2 (%):  [80 %] 80 % (02/07 0800) Weight:  [153.8 kg (339 lb 1.1 oz)] 153.8 kg (339 lb 1.1 oz) (02/07 0300) HEMODYNAMICS: CVP:  [17 mmHg-20 mmHg] 20 mmHg VENTILATOR SETTINGS: Vent Mode:  [-] PRVC FiO2 (%):  [80 %] 80 % Set Rate:  [14 bmp] 14 bmp Vt Set:  [430 mL] 430 mL PEEP:  [8 cmH20] 8 cmH20 Plateau Pressure:  [17 cmH20-25 cmH20] 25 cmH20 INTAKE /  OUTPUT: Intake/Output      02/06 0701 - 02/07 0700 02/07 0701 - 02/08 0700   I.V. (mL/kg) 819.4 (5.3) 28.4 (0.2)   NG/GT 180 30   IV Piggyback 1180 30   Total Intake(mL/kg) 2179.4 (14.2) 88.4 (0.6)   Urine (mL/kg/hr) 2455 (0.7) 600   Emesis/NG output 350    Drains 530 50   Total Output 3335 650   Net -1155.7 -561.6          PHYSICAL EXAMINATION:  Gen: Morbidly obese, rass 0 HEENT:  Obese, EET, line clean PULM: diminished in bases CV: tachy, regular, no mgr, no JVD AB: no bowel sounds, anterior wound with wound vac in place, open Ext: cool, pitting edema in legs improved Derm: wound open Neuro: sedated on vent, rass at goal, follows commands. No focal deficits   LABS:  Lab 12/19/12 0400 12/18/12 1239 12/18/12 0340 12/18/12 0336 12/17/12 1500 12/17/12 0455 12/17/12 0450 12/16/12 1710 12/16/12 1614 12/16/12 0300 12/15/12 2022 12/15/12 1323  HGB 8.5* -- 8.9* -- -- -- 9.1* -- -- -- -- --  WBC 17.5* -- 20.6* -- -- -- 18.9* -- -- -- -- --  PLT 269 -- 248 -- -- -- 224 -- -- -- -- --  NA 142 -- 140 -- 137 -- -- -- -- -- -- --  K 3.7 -- 4.1 -- -- -- -- -- -- -- -- --  CL 107 -- 106 -- 103 -- -- -- -- -- -- --  CO2 27 -- 25 --  25 -- -- -- -- -- -- --  GLUCOSE 129* -- 131* -- 108* -- -- -- -- -- -- --  BUN 16 -- 13 -- 14 -- -- -- -- -- -- --  CREATININE 0.97 -- 0.96 -- 0.96 -- -- -- -- -- -- --  CALCIUM 8.8 -- 8.2* -- 7.7* -- -- -- -- -- -- --  MG -- -- -- -- -- -- 1.6 -- -- -- -- --  PHOS -- -- 2.7 -- -- -- 2.4 -- 2.9 -- -- --  AST -- -- 21 -- -- -- 22 -- -- -- -- 21  ALT -- -- 17 -- -- -- 20 -- -- -- -- 20  ALKPHOS -- -- 80 -- -- -- 83 -- -- -- -- 92  BILITOT -- -- 0.2* -- -- -- 0.4 -- -- -- -- 0.6  PROT -- -- 6.1 -- -- -- 6.1 -- -- -- -- 8.0  ALBUMIN -- -- 1.9* -- -- -- 2.0* -- 2.4* -- -- --  APTT -- -- -- -- -- -- 36 -- -- -- -- --  INR -- -- -- -- -- -- 1.35 -- -- -- 1.20 --  LATICACIDVEN -- -- -- -- -- -- -- 1.3 -- -- -- --  TROPONINI -- -- -- -- -- -- -- -- -- <0.30 --  --  PROCALCITON -- -- 28.34 -- -- -- 36.83 52.08 -- -- -- --  PROBNP -- -- -- -- -- -- -- -- -- -- -- --  O2SATVEN -- -- -- -- -- -- -- -- -- -- -- --  PHART -- 7.446 -- 7.414 -- 7.405 -- -- -- -- -- --  PCO2ART -- 41.0 -- 40.5 -- 41.2 -- -- -- -- -- --  PO2ART -- 61.0* -- 61.9* -- 64.0* -- -- -- -- -- --    Lab 12/19/12 0740 12/19/12 0506 12/19/12 0401 12/19/12 0255 12/19/12 0151  GLUCAP 111* 113* 121* 113* 108*      ASSESSMENT / PLAN:  PULMONARY A: 1) Post op respiratory failure - Open abdomen, plan to return to OR for reexploration, closure of abdbomen P:   - Will not extubatable until abdomen closed -pcxr reviewed, continue neg balance -F/U ABG reviewed, repeat now, will escalate peep if needed to get to 60% -allow neg balance to improve O2 needs -can reduce MV -if able to 60% then plan wean cpap 5 ps 5-8 -would extubate from ps 8, cpap 8 if needed, given body habitus -will need outpt sleep study  CARDIOVASCULAR A:  1) Shock, presumably septic - Pressors off with stable BP, EGDT completed P:  -tele Neg balance tolerated  RENAL A:   1) Oliguric renal failure- likely due to sepsis, improved urine output & SCr P:   -F/u Chem -supp K with lasix -Monitor urine output  GASTROINTESTINAL A:   1) Abdominal hernia, bowel strangulation and perforation now s/p exlap, partial small bowel resection P:   -ppi -NPO -avoid tpn as pre ICU status nutritional ok, start TPN ONLY if day 8 and still no feeds -await repeat OR trip for closure, with diuresis  HEMATOLOGIC A:   1) MIld anemia P:  -sub q hep -cbc in am  -lasix for dilution  INFECTIOUS A:   1) Septic shock from peritonitis; CT abdomen with fluid collection worrisome for abscess, but apparently not seen in OR (not noted in op note) P:   - remains culture neg -dc vanc, IMI -Add zosyn -add stop date  myco  ENDOCRINE A:  1) DM2 P:   -ICU hyperglycemia protocol, dc drip -add ssi  NEUROLOGIC A:   1)  Post op pain control 2) Vent synchrony P:   -fentanyl gtt -versed gtt, goal to dc this post op  I have personally obtained a history, examined the patient, evaluated laboratory and imaging results, formulated the assessment and plan and placed orders. CRITICAL CARE: The patient is critically ill with multiple organ systems failure and requires high complexity decision making for assessment and support, frequent evaluation and titration of therapies, application of advanced monitoring technologies and extensive interpretation of multiple databases. Critical Care Time devoted to patient care services described in this note is 30 minutes.   Mcarthur Rossetti. Tyson Alias, MD, FACP Pgr: [phone] [facility]

## 2012-12-19 NOTE — Transfer of Care (Signed)
Immediate Anesthesia Transfer of Care Note  Patient: Erin Schultz  Procedure(s) Performed: Procedure(s) (LRB) with comments: EXPLORATORY LAPAROTOMY (N/A) ABDOMINAL VACUUM ASSISTED CLOSURE CHANGE (N/A)  Patient Location: PACU  Anesthesia Type:General  Level of Consciousness: Patient remains intubated per anesthesia plan  Airway & Oxygen Therapy: Patient remains intubated per anesthesia plan and Patient placed on Ventilator (see vital sign flow sheet for setting)  Post-op Assessment: Post -op Vital signs reviewed and stable  Post vital signs: Reviewed and stable  Complications: No apparent anesthesia complications

## 2012-12-19 NOTE — Progress Notes (Signed)
ANTIBIOTIC CONSULT NOTE - INITIAL  Pharmacy Consult for Zosyn Indication: s/p emergent surgery for perforated bowel  Allergies  Allergen Reactions  . Corn-Containing Products Nausea And Vomiting    Stomach inflamed  . Sulfa Antibiotics     Childhood reaction    Patient Measurements: Height: 5\' 7"  (170.2 cm) Weight: 339 lb 1.1 oz (153.8 kg) IBW/kg (Calculated) : 61.6  Vital Signs: Temp: 98.8 F (37.1 C) (02/07 0808) Temp src: Oral (02/07 0808) BP: 114/71 mmHg (02/07 0900) Pulse Rate: 89  (02/07 0900) Intake/Output from previous day: 02/06 0701 - 02/07 0700 In: 2179.4 [I.V.:819.4; NG/GT:180; IV Piggyback:1180] Out: 3335 [Urine:2455; Emesis/NG output:350; Drains:530] Intake/Output from this shift: Total I/O In: 149.4 [I.V.:59.4; NG/GT:30; IV Piggyback:60] Out: 650 [Urine:600; Drains:50]  Labs:  [facility] 12/19/12 0400 12/18/12 0340 12/17/12 1500 12/17/12 0450  WBC 17.5* 20.6* -- 18.9*  HGB 8.5* 8.9* -- 9.1*  PLT 269 248 -- 224  LABCREA -- -- -- --  CREATININE 0.97 0.96 0.96 --   Estimated Creatinine Clearance: 111.5 ml/min (by C-G formula based on Cr of 0.97). No results found for this basename: VANCOTROUGH:2,VANCOPEAK:2,VANCORANDOM:2,GENTTROUGH:2,GENTPEAK:2,GENTRANDOM:2,TOBRATROUGH:2,TOBRAPEAK:2,TOBRARND:2,AMIKACINPEAK:2,AMIKACINTROU:2,AMIKACIN:2, in the last 72 hours   Microbiology: Recent Results (from the past 720 hour(s))  URINE CULTURE     Status: Normal   Collection Time   12/15/12  1:36 PM      Component Value Range Status Comment   Specimen Description URINE, RANDOM   Final    Special Requests NONE   Final    Culture  Setup Time 12/15/2012 14:35   Final    Colony Count 50,000 COLONIES/ML   Final    Culture     Final    Value: Multiple bacterial morphotypes present, none predominant. Suggest appropriate recollection if clinically indicated.   Report Status 12/16/2012 FINAL   Final   MRSA PCR SCREENING     Status: Normal   Collection Time   12/16/12  2:51 AM       Component Value Range Status Comment   MRSA by PCR NEGATIVE  NEGATIVE Final   CULTURE, BLOOD (ROUTINE X 2)     Status: Normal (Preliminary result)   Collection Time   12/16/12  4:20 AM      Component Value Range Status Comment   Specimen Description BLOOD RIGHT HAND   Final    Special Requests BOTTLES DRAWN AEROBIC ONLY 10CC   Final    Culture  Setup Time 12/16/2012 09:48   Final    Culture     Final    Value:        BLOOD CULTURE RECEIVED NO GROWTH TO DATE CULTURE WILL BE HELD FOR 5 DAYS BEFORE ISSUING A FINAL NEGATIVE REPORT   Report Status PENDING   Incomplete   CULTURE, BLOOD (ROUTINE X 2)     Status: Normal (Preliminary result)   Collection Time   12/16/12  7:00 AM      Component Value Range Status Comment   Specimen Description BLOOD THUMB LEFT   Final    Special Requests BOTTLES DRAWN AEROBIC ONLY 4.0CC   Final    Culture  Setup Time 12/16/2012 15:50   Final    Culture     Final    Value:        BLOOD CULTURE RECEIVED NO GROWTH TO DATE CULTURE WILL BE HELD FOR 5 DAYS BEFORE ISSUING A FINAL NEGATIVE REPORT   Report Status PENDING   Incomplete     Assessment: 85 YOF s/p emergent abdominal surgery for perforated  bowel on day 5 of antibiotics to change back to Zosyn today per CCM. No abscess seen in OR.  Micafungin to stop tomorrow. SCr back at baseline. UOP improved at 0.6 cc/kg/hr for last 24hrs. WBC is trending down. Afebrile. Wound remains open at this time.   Culture Hx:  2/4 BC X 2>>pending 2/3: UCx: none predominant  Antibiotic Hx:  2/3 Zosyn>>2/4; 2/7 >> 2/3 Ertapenam>>2/4 Vanc 2/4>>2/6  Primaxin 2/4>> 2/7 Micafungin 2/4>>2/8  Goal of Therapy:  Clinical resolution of infection  Plan:  1. Zosyn 3.375g IV q8h- 4 hr infusion.  2. Follow-up renal function, cultures, clinical status. 3. Follow-up duration of therapy.  Link Snuffer, PharmD, BCPS Clinical Pharmacist [phone] 12/19/2012,9:35 AM

## 2012-12-19 NOTE — Progress Notes (Signed)
FPTS Social Note  Erin Schultz is a 47 y.o. year old female presenting with PMH of HTN, and DM2, here with an incarcerated hernia leading to SBO and perforation s/p resection and adhesion lysis on 2/3 now intubated and weaned off pressors. She had septic ATN requiring CRRT which is now stopped due to improvement in renal function. Her vent needs are improving and surgery plans to take her back to the OR today vs tomorrow to exam for possibility of closure or replacing wound vac.    She is currently ICU status and being actively managed by CCM- we appreciate their excellent care.  We will follow along and assume primary care when she becomes floor status.   Kevin Fenton, MD 12/19/2012, [phone]

## 2012-12-19 NOTE — Progress Notes (Signed)
2 Days Post-Op  Subjective: Stable. Off all pressors. Remains vent dependent with FiO2 80%, PEEP at 8.Excellent urine output but fluid balance only slightly -688 cc.  She is awake and arousable. Denies abdominal pain.  VAC drainage clear, watery, serosanguineous.  Objective: Vital signs in last 24 hours: Temp:  [98.1 F (36.7 C)-98.5 F (36.9 C)] 98.2 F (36.8 C) (02/07 0422) Pulse Rate:  [81-100] 83  (02/07 0400) Resp:  [23-30] 27  (02/07 0400) BP: (94-135)/(51-78) 104/62 mmHg (02/07 0400) SpO2:  [92 %-98 %] 93 % (02/07 0400) Arterial Line BP: (104-148)/(57-80) 125/68 mmHg (02/07 0400) FiO2 (%):  [80 %] 80 % (02/07 0400) Weight:  [339 lb 1.1 oz (153.8 kg)] 339 lb 1.1 oz (153.8 kg) (02/07 0300)    Intake/Output from previous day: 02/06 0701 - 02/07 0700 In: 1896.4 [I.V.:726.4; NG/GT:180; IV Piggyback:990] Out: 2585 [Urine:1705; Emesis/NG output:350; Drains:530] Intake/Output this shift: Total I/O In: 684 [I.V.:254; NG/GT:90; IV Piggyback:340] Out: 925 [Urine:395; Emesis/NG output:350; Drains:180]  General appearance: Morbidly obese on that later. Opens eyes, interest questions, follows commands. No distress from pain. GI: abdomen generally soft. Minimal tenderness. Dressing in place. Drainage watery, noninfected looking.  Lab Results:  Results for orders placed during the hospital encounter of 12/15/12 (from the past 24 hour(s))  GLUCOSE, CAPILLARY     Status: Abnormal   Collection Time   12/18/12  5:47 AM      Component Value Range   Glucose-Capillary 118 (*) 70 - 99 mg/dL  GLUCOSE, CAPILLARY     Status: Abnormal   Collection Time   12/18/12  6:49 AM      Component Value Range   Glucose-Capillary 126 (*) 70 - 99 mg/dL  GLUCOSE, CAPILLARY     Status: Abnormal   Collection Time   12/18/12  8:05 AM      Component Value Range   Glucose-Capillary 128 (*) 70 - 99 mg/dL  GLUCOSE, CAPILLARY     Status: Abnormal   Collection Time   12/18/12  8:36 AM      Component Value Range    Glucose-Capillary 126 (*) 70 - 99 mg/dL   Comment 1 Documented in Chart     Comment 2 Notify RN    GLUCOSE, CAPILLARY     Status: Abnormal   Collection Time   12/18/12  9:26 AM      Component Value Range   Glucose-Capillary 113 (*) 70 - 99 mg/dL  GLUCOSE, CAPILLARY     Status: Abnormal   Collection Time   12/18/12 10:20 AM      Component Value Range   Glucose-Capillary 111 (*) 70 - 99 mg/dL  GLUCOSE, CAPILLARY     Status: Abnormal   Collection Time   12/18/12 11:38 AM      Component Value Range   Glucose-Capillary 107 (*) 70 - 99 mg/dL  GLUCOSE, CAPILLARY     Status: Abnormal   Collection Time   12/18/12 12:37 PM      Component Value Range   Glucose-Capillary 102 (*) 70 - 99 mg/dL  POCT I-STAT 3, BLOOD GAS (G3+)     Status: Abnormal   Collection Time   12/18/12 12:39 PM      Component Value Range   pH, Arterial 7.446  7.350 - 7.450   pCO2 arterial 41.0  35.0 - 45.0 mmHg   pO2, Arterial 61.0 (*) 80.0 - 100.0 mmHg   Bicarbonate 28.3 (*) 20.0 - 24.0 mEq/L   TCO2 29  0 - 100 mmol/L  O2 Saturation 92.0     Acid-Base Excess 4.0 (*) 0.0 - 2.0 mmol/L   Patient temperature 36.9 C     Collection site ARTERIAL LINE     Drawn by Operator     Sample type ARTERIAL    GLUCOSE, CAPILLARY     Status: Normal   Collection Time   12/18/12  1:47 PM      Component Value Range   Glucose-Capillary 97  70 - 99 mg/dL  GLUCOSE, CAPILLARY     Status: Normal   Collection Time   12/18/12  3:00 PM      Component Value Range   Glucose-Capillary 91  70 - 99 mg/dL  GLUCOSE, CAPILLARY     Status: Normal   Collection Time   12/18/12  4:04 PM      Component Value Range   Glucose-Capillary 87  70 - 99 mg/dL  GLUCOSE, CAPILLARY     Status: Abnormal   Collection Time   12/18/12  5:08 PM      Component Value Range   Glucose-Capillary 100 (*) 70 - 99 mg/dL  GLUCOSE, CAPILLARY     Status: Abnormal   Collection Time   12/18/12  6:27 PM      Component Value Range   Glucose-Capillary 100 (*) 70 - 99 mg/dL  GLUCOSE,  CAPILLARY     Status: Abnormal   Collection Time   12/18/12  7:20 PM      Component Value Range   Glucose-Capillary 106 (*) 70 - 99 mg/dL  GLUCOSE, CAPILLARY     Status: Abnormal   Collection Time   12/18/12  8:31 PM      Component Value Range   Glucose-Capillary 108 (*) 70 - 99 mg/dL  GLUCOSE, CAPILLARY     Status: Abnormal   Collection Time   12/18/12  9:18 PM      Component Value Range   Glucose-Capillary 113 (*) 70 - 99 mg/dL  GLUCOSE, CAPILLARY     Status: Abnormal   Collection Time   12/18/12 10:37 PM      Component Value Range   Glucose-Capillary 115 (*) 70 - 99 mg/dL  GLUCOSE, CAPILLARY     Status: Abnormal   Collection Time   12/18/12 11:41 PM      Component Value Range   Glucose-Capillary 113 (*) 70 - 99 mg/dL  GLUCOSE, CAPILLARY     Status: Abnormal   Collection Time   12/19/12 12:45 AM      Component Value Range   Glucose-Capillary 116 (*) 70 - 99 mg/dL  GLUCOSE, CAPILLARY     Status: Abnormal   Collection Time   12/19/12  1:51 AM      Component Value Range   Glucose-Capillary 108 (*) 70 - 99 mg/dL  GLUCOSE, CAPILLARY     Status: Abnormal   Collection Time   12/19/12  2:55 AM      Component Value Range   Glucose-Capillary 113 (*) 70 - 99 mg/dL  CBC     Status: Abnormal   Collection Time   12/19/12  4:00 AM      Component Value Range   WBC 17.5 (*) 4.0 - 10.5 K/uL   RBC 3.05 (*) 3.87 - 5.11 MIL/uL   Hemoglobin 8.5 (*) 12.0 - 15.0 g/dL   HCT 16.1 (*) 09.6 - 04.5 %   MCV 85.9  78.0 - 100.0 fL   MCH 27.9  26.0 - 34.0 pg   MCHC 32.4  30.0 - 36.0 g/dL  RDW 13.9  11.5 - 15.5 %   Platelets 269  150 - 400 K/uL  BASIC METABOLIC PANEL     Status: Abnormal   Collection Time   12/19/12  4:00 AM      Component Value Range   Sodium 142  135 - 145 mEq/L   Potassium 3.7  3.5 - 5.1 mEq/L   Chloride 107  96 - 112 mEq/L   CO2 27  19 - 32 mEq/L   Glucose, Bld 129 (*) 70 - 99 mg/dL   BUN 16  6 - 23 mg/dL   Creatinine, Ser 1.61  0.50 - 1.10 mg/dL   Calcium 8.8  8.4 - 09.6 mg/dL    GFR calc non Af Amer 68 (*) >90 mL/min   GFR calc Af Amer 79 (*) >90 mL/min  GLUCOSE, CAPILLARY     Status: Abnormal   Collection Time   12/19/12  4:01 AM      Component Value Range   Glucose-Capillary 121 (*) 70 - 99 mg/dL  GLUCOSE, CAPILLARY     Status: Abnormal   Collection Time   12/19/12  5:06 AM      Component Value Range   Glucose-Capillary 113 (*) 70 - 99 mg/dL     Studies/Results: @RISRSLT24 @     . antiseptic oral rinse  15 mL Mouth Rinse QID  . chlorhexidine  15 mL Mouth Rinse BID  . heparin  5,000 Units Subcutaneous Q8H  . imipenem-cilastatin  500 mg Intravenous Q6H  . micafungin (MYCAMINE) IV  100 mg Intravenous Daily  . pantoprazole (PROTONIX) IV  40 mg Intravenous Daily  . sodium chloride  250 mL Intravenous Once     Assessment/Plan: s/p Procedure(s): ABDOMINAL VACUUM ASSISTED CLOSURE CHANGE  POD #4, #2. - Laparotomy, extensive lysis of adhesions, resection multiple hernia sacs, small bowel resection, placement of VAC for strangulated, perforated ventral incisional hernia by Dr. Donell Beers. No evidence of infection, ischemia, or leak on reexploration on 12/17/2012. Unable to close the fascia  due to edema. Will need to plan staged closure over the next several days.   Plan return to OR Today, or at the latest tomorrow morning, for dressing change and reexamination. Family aware.   Diurese,  to reduce tissue edema.   Septic shock, Intermedics improving, off the pressors. , Primaxin, vancomycin , mycafungin.  Steroids were discontinued. VDRF. Gas exchange and compliance and satisfactory, Other than high FiO2.. Still at risk for ARDS-like syndrome.  Acute oliguric renal failure due to septic shock, resolving  DM. See insulin drip orders.     LOS: 4 days    Erin Schultz 12/19/2012  . .prob

## 2012-12-19 NOTE — Op Note (Addendum)
Patient Name:           Erin Schultz   Date of Surgery:        12/19/2012  Pre op Diagnosis:      Open abdomen, status post laparotomy, small bowel resection, and debridement of hernia sacs  Post op Diagnosis:    Same  Procedure:                 Planned return to operating room for exploratory  laparotomy, replacement negative pressure abdominal dressing  Surgeon:                     Angelia Mould. Derrell Lolling, M.D., FACS  Assistant:                      None  Operative Indications:    This is a 47 year old morbidly obese woman who has had multiple operations in the past. She presented to this hospital 4 days  ago with abdominal pain, septic shock, and was found to have a strangulated, perforated ventral incisional hernia. She was taken to the operating room and underwent exploration by Dr. Donell Beers. Multiple hernia sacs were debrided and small bowel resection was performed with washout. The patient was in septic shock and in extremis and the abdomen was packed open with a negative pressure dressing. She has improved. she is still ventillator dependent ut renal function has normalized and she is off pressor support. 48 hours ago I brought her to the operating room and explore the abdomen and found that her small intestine was extremely edematous but viable and without any signs of any further leak or ischemia or infection. We could not close the fascia and sewed a negative pressure dressing was placed.She is brought back to the operating room Today for planned replacement of her negative pressure dressing and assessment of the fascial closure   Operative Findings:       Once again, the small bowel was pink  and viable but was extremely edematous. If anything it was more edematous than 2 days ago. There was no infection and no ischemia and no enteric drainage. The edges of the fascia were at least 8-9 apart and there was no way to return the small bowel to the abdominal cavity. I did not think we could bridge  this biologic mesh today. I simply replaced the negative pressure dressing and we will have to plan a staged abdominal wall closure over the next week or 2. She will need to be aggressively diuresed to reduce edema.  Procedure in Detail:          Following the induction of general endotracheal anesthesia, including muscle paralysis, a surgical time out was performed. The negative pressure dressing was completely removed. The abdomen was prepped and draped in a sterile fashion. I explored simultaneous tissue and the fascial edges and the small bowel with findings as described above. I could not push the small bowel even back to the level of the fascial edges. I simply washed everything out and replaced the negative pressure dressing in standard fashion placing the most innermost layer under the fascia. I then placed the other layers and connected to the suction device and we had a good seal. The patient  remained stable. EBL negligible. Counts correct. Complications none.     Angelia Mould. Derrell Lolling, M.D., FACS General and Minimally Invasive Surgery Breast and Colorectal Surgery  12/19/2012 3:25 PM

## 2012-12-19 NOTE — Anesthesia Preprocedure Evaluation (Signed)
Anesthesia Evaluation  Patient identified by MRN, date of birth, ID band Patient awake    Reviewed: Allergy & Precautions, H&P , NPO status , Patient's Chart, lab work & pertinent test results  Airway      Comment: ETT in situ Dental   Pulmonary          Cardiovascular hypertension, Rhythm:Regular Rate:Normal     Neuro/Psych    GI/Hepatic hiatal hernia,   Endo/Other  diabetes, Type 2, Oral Hypoglycemic Agents  Renal/GU      Musculoskeletal   Abdominal   Peds  Hematology   Anesthesia Other Findings   Reproductive/Obstetrics                           Anesthesia Physical Anesthesia Plan  ASA: IV  Anesthesia Plan: General   Post-op Pain Management:    Induction: Intravenous and Inhalational  Airway Management Planned: Oral ETT  Additional Equipment:   Intra-op Plan:   Post-operative Plan: Post-operative intubation/ventilation  Informed Consent: I have reviewed the patients History and Physical, chart, labs and discussed the procedure including the risks, benefits and alternatives for the proposed anesthesia with the patient or authorized representative who has indicated his/her understanding and acceptance.     Plan Discussed with: Anesthesiologist and Surgeon  Anesthesia Plan Comments:         Anesthesia Quick Evaluation

## 2012-12-19 NOTE — Anesthesia Postprocedure Evaluation (Signed)
Anesthesia Post Note  Patient: Erin Schultz  Procedure(s) Performed: Procedure(s) (LRB): EXPLORATORY LAPAROTOMY (N/A) ABDOMINAL VACUUM ASSISTED CLOSURE CHANGE (N/A)  Anesthesia type: General  Patient location: ICU  Post pain: Pain level controlled  Post assessment: Post-op Vital signs reviewed  Last Vitals:  Filed Vitals:   12/19/12 1544  BP:   Pulse:   Temp: 36.9 C  Resp:     Post vital signs: stable  Level of consciousness: Patient remains intubated per anesthesia plan  Complications: No apparent anesthesia complications

## 2012-12-20 ENCOUNTER — Inpatient Hospital Stay (HOSPITAL_COMMUNITY): Payer: BC Managed Care – PPO

## 2012-12-20 DIAGNOSIS — R6521 Severe sepsis with septic shock: Secondary | ICD-10-CM | POA: Diagnosis present

## 2012-12-20 DIAGNOSIS — J9601 Acute respiratory failure with hypoxia: Secondary | ICD-10-CM

## 2012-12-20 DIAGNOSIS — K56609 Unspecified intestinal obstruction, unspecified as to partial versus complete obstruction: Secondary | ICD-10-CM | POA: Diagnosis present

## 2012-12-20 DIAGNOSIS — A419 Sepsis, unspecified organism: Secondary | ICD-10-CM | POA: Diagnosis present

## 2012-12-20 DIAGNOSIS — K421 Umbilical hernia with gangrene: Secondary | ICD-10-CM

## 2012-12-20 DIAGNOSIS — E440 Moderate protein-calorie malnutrition: Secondary | ICD-10-CM

## 2012-12-20 DIAGNOSIS — K650 Generalized (acute) peritonitis: Secondary | ICD-10-CM

## 2012-12-20 DIAGNOSIS — K659 Peritonitis, unspecified: Secondary | ICD-10-CM

## 2012-12-20 DIAGNOSIS — R6520 Severe sepsis without septic shock: Secondary | ICD-10-CM

## 2012-12-20 DIAGNOSIS — K631 Perforation of intestine (nontraumatic): Secondary | ICD-10-CM | POA: Diagnosis present

## 2012-12-20 HISTORY — DX: Acute respiratory failure with hypoxia: J96.01

## 2012-12-20 HISTORY — DX: Generalized (acute) peritonitis: K65.0

## 2012-12-20 HISTORY — DX: Sepsis, unspecified organism: A41.9

## 2012-12-20 HISTORY — DX: Unspecified intestinal obstruction, unspecified as to partial versus complete obstruction: K56.609

## 2012-12-20 LAB — BLOOD GAS, ARTERIAL
Bicarbonate: 29 mEq/L — ABNORMAL HIGH (ref 20.0–24.0)
TCO2: 30.4 mmol/L (ref 0–100)
pCO2 arterial: 45.5 mmHg — ABNORMAL HIGH (ref 35.0–45.0)
pH, Arterial: 7.419 (ref 7.350–7.450)

## 2012-12-20 LAB — CBC WITH DIFFERENTIAL/PLATELET
Eosinophils Relative: 2 % (ref 0–5)
HCT: 27.5 % — ABNORMAL LOW (ref 36.0–46.0)
Lymphs Abs: 2.5 10*3/uL (ref 0.7–4.0)
MCV: 86.2 fL (ref 78.0–100.0)
Monocytes Relative: 6 % (ref 3–12)
Neutro Abs: 13.7 10*3/uL — ABNORMAL HIGH (ref 1.7–7.7)
Platelets: 295 10*3/uL (ref 150–400)
RBC: 3.19 MIL/uL — ABNORMAL LOW (ref 3.87–5.11)
WBC: 17.9 10*3/uL — ABNORMAL HIGH (ref 4.0–10.5)

## 2012-12-20 LAB — GLUCOSE, CAPILLARY
Glucose-Capillary: 100 mg/dL — ABNORMAL HIGH (ref 70–99)
Glucose-Capillary: 109 mg/dL — ABNORMAL HIGH (ref 70–99)
Glucose-Capillary: 108 mg/dL — ABNORMAL HIGH (ref 70–99)

## 2012-12-20 LAB — COMPREHENSIVE METABOLIC PANEL
Albumin: 1.9 g/dL — ABNORMAL LOW (ref 3.5–5.2)
BUN: 16 mg/dL (ref 6–23)
Creatinine, Ser: 0.99 mg/dL (ref 0.50–1.10)
Total Protein: 6.1 g/dL (ref 6.0–8.3)

## 2012-12-20 MED ORDER — CLINIMIX E/DEXTROSE (5/15) 5 % IV SOLN
INTRAVENOUS | Status: AC
  Administered 2012-12-20: 18:00:00 via INTRAVENOUS
  Filled 2012-12-20: qty 1000

## 2012-12-20 NOTE — Progress Notes (Signed)
NUTRITION FOLLOW UP/consult   Intervention:   1. TPN per pharmacy, to meet underfeeding kcal goals 1564-1825 kcal and 134-170 gm protein daily, as able with current limitations of [employer]  2. RD will continue to follow    Nutrition Dx:   Inadequate oral intake related to altered GI function as evidenced by NPO status  Goal:   TPN to provide 60-70% of estimated calorie needs (22-25 kcals/kg ideal body weight) and >/= 90% of estimated protein needs, based on ASPEN guidelines for permissive underfeeding in critically ill obese individuals.   Monitor:   Vent status, weight trends, wound healing, labs  Ass/p Lap ex on sessment:   RD consulted for new TPN. Pt remains vent dependant. No longer requiring CRRT.     S/p ex lap, LOA, small bowel resection, and wound vac placement on 2/4.  Has been back to OR x2 to try and close abd, but pt with too much edema. Pt continues to be NPO, unable to feed enterally. TPN to be started today. Pt with increased protein needs related to wound healing. Recommend maximize protein in TPN to promote healing.   Not planned to extubate until able to close abdomin.   Height: Ht Readings from Last 1 Encounters:  12/16/12 5\' 7"  (1.702 m)    Weight Status:   Wt Readings from Last 1 Encounters:  12/20/12 328 lb 7.8 oz (149 kg)   Patient is currently intubated on ventilator support.  MV: 11 Temp:Temp (24hrs), Avg:98.4 F (36.9 C), Min:98 F (36.7 C), Max:99.4 F (37.4 C)  Propofol: none   Re-estimated needs:  Kcal: 2607 (underfeeding goal:1564-1825 kcal) Protein: 134-170 gm  Fluid: >/=1.5 L/day  Skin: open abdomin, wound vac in place   Diet Order:   NPO   Intake/Output Summary (Last 24 hours) at 12/20/12 1150 Last data filed at 12/20/12 1100  Gross per 24 hour  Intake 1195.75 ml  Output   3370 ml  Net -2174.25 ml    Last BM: not documented this admission    Labs:   Recent Labs Lab 12/16/12 1614 12/17/12 0450  12/18/12 0340  12/19/12 0400 12/19/12 1600 12/20/12 0442  NA 135 138  < > 140 142  --  144  K 3.8 3.7  < > 4.1 3.7  --  3.7  CL 100 102  < > 106 107  --  105  CO2 21 24  < > 25 27  --  28  BUN 25* 17  < > 13 16  --  16  CREATININE 1.95* 1.08  < > 0.96 0.97 0.95 0.99  CALCIUM 8.1* 8.0*  < > 8.2* 8.8  --  9.3  MG  --  1.6  --   --   --   --   --   PHOS 2.9 2.4  --  2.7  --   --   --   GLUCOSE 225* 149*  < > 131* 129*  --  113*  < > = values in this interval not displayed.  CBG (last 3)   Recent Labs  12/20/12 0007 12/20/12 0515 12/20/12 0754  GLUCAP 106* 100* 109*    Scheduled Meds: . antiseptic oral rinse  15 mL Mouth Rinse QID  . chlorhexidine  15 mL Mouth Rinse BID  . furosemide  60 mg Intravenous BID  . heparin  5,000 Units Subcutaneous Q8H  . insulin aspart  0-15 Units Subcutaneous Q4H  . pantoprazole (PROTONIX) IV  40 mg Intravenous Daily  . piperacillin-tazobactam (  ZOSYN)  IV  3.375 g Intravenous Q8H  . sodium chloride  250 mL Intravenous Once    Continuous Infusions: . sodium chloride 20 mL/hr at 12/20/12 0000  . fentaNYL infusion INTRAVENOUS 275 mcg/hr (12/20/12 1100)  . midazolam (VERSED) infusion 6 mg/hr (12/20/12 1100)  . TPN [facility]) +/- additives      Clarene Duke RD, LDN Pager [phone] After Hours pager [phone]

## 2012-12-20 NOTE — Progress Notes (Signed)
Patient ID: Erin Schultz, female   DOB: 1966/04/16, 47 y.o.   MRN: [id number] 1 Day Post-Op  Subjective: Stable. Off all pressors. Remains vent dependent.  She is awake and arousable. Some abdominal pain.  VAC drainage clear, watery, serosanguineous.  Objective: Vital signs in last 24 hours: Temp:  [98 F (36.7 C)-98.8 F (37.1 C)] 98.4 F (36.9 C) (02/08 0756) Pulse Rate:  [78-93] 93 (02/08 0800) Resp:  [10-26] 23 (02/08 0800) BP: (90-123)/(54-68) 123/68 mmHg (02/08 0800) SpO2:  [97 %-100 %] 99 % (02/08 0800) Arterial Line BP: (100-146)/(53-73) 129/65 mmHg (02/08 0700) FiO2 (%):  [50 %-75 %] 50 % (02/08 0845) Weight:  [328 lb 7.8 oz (149 kg)] 328 lb 7.8 oz (149 kg) (02/08 0500)    Intake/Output from previous day: 02/07 0701 - 02/08 0700 In: 1318.4 [I.V.:832.4; NG/GT:60; IV Piggyback:426] Out: 4740 [Urine:3990; Emesis/NG output:350; Drains:400] Intake/Output this shift: Total I/O In: 103.2 [I.V.:67.2; NG/GT:30; IV Piggyback:6] Out: 60 [Urine:60]  General appearance: Morbidly obese on that later. Opens eyes, follows commands. No distress from pain. GI: abdomen generally soft. some tenderness over vac. Blue sponge vac in place. Drainage watery, noninfected looking.  Lab Results:  Results for orders placed during the hospital encounter of 12/15/12 (from the past 24 hour(s))  GLUCOSE, CAPILLARY     Status: Abnormal   Collection Time    12/19/12 12:06 PM      Result Value Range   Glucose-Capillary 101 (*) 70 - 99 mg/dL   Comment 1 Notify RN    BLOOD GAS, ARTERIAL     Status: Abnormal   Collection Time    12/19/12  2:04 PM      Result Value Range   FIO2 0.75     Delivery systems VENTILATOR     Mode PRESSURE REGULATED VOLUME CONTROL     VT 430     Rate 12     Peep/cpap 10.0     pH, Arterial 7.408  7.350 - 7.450   pCO2 arterial 39.7  35.0 - 45.0 mmHg   pO2, Arterial 85.8  80.0 - 100.0 mmHg   Bicarbonate 24.5 (*) 20.0 - 24.0 mEq/L   TCO2 25.7  0 - 100 mmol/L   Acid-Base Excess 0.4  0.0 - 2.0 mmol/L   O2 Saturation 96.4     Patient temperature 98.6     Drawn by [id number]     Sample type ARTERIAL DRAW    GLUCOSE, CAPILLARY     Status: None   Collection Time    12/19/12  3:43 PM      Result Value Range   Glucose-Capillary 93  70 - 99 mg/dL  CBC     Status: Abnormal   Collection Time    12/19/12  4:00 PM      Result Value Range   WBC 19.7 (*) 4.0 - 10.5 K/uL   RBC 3.32 (*) 3.87 - 5.11 MIL/uL   Hemoglobin 9.3 (*) 12.0 - 15.0 g/dL   HCT 91.4 (*) 78.2 - 95.6 %   MCV 86.1  78.0 - 100.0 fL   MCH 28.0  26.0 - 34.0 pg   MCHC 32.5  30.0 - 36.0 g/dL   RDW 21.3  08.6 - 57.8 %   Platelets 284  150 - 400 K/uL  CREATININE, SERUM     Status: Abnormal   Collection Time    12/19/12  4:00 PM      Result Value Range   Creatinine, Ser 0.95  0.50 - 1.10 mg/dL  GFR calc non Af Amer 70 (*) >90 mL/min   GFR calc Af Amer 81 (*) >90 mL/min  GLUCOSE, CAPILLARY     Status: Abnormal   Collection Time    12/19/12  8:40 PM      Result Value Range   Glucose-Capillary 115 (*) 70 - 99 mg/dL   Comment 1 Notify RN     Comment 2 Documented in Chart    GLUCOSE, CAPILLARY     Status: Abnormal   Collection Time    12/20/12 12:07 AM      Result Value Range   Glucose-Capillary 106 (*) 70 - 99 mg/dL   Comment 1 Notify RN     Comment 2 Documented in Chart    COMPREHENSIVE METABOLIC PANEL     Status: Abnormal   Collection Time    12/20/12  4:42 AM      Result Value Range   Sodium 144  135 - 145 mEq/L   Potassium 3.7  3.5 - 5.1 mEq/L   Chloride 105  96 - 112 mEq/L   CO2 28  19 - 32 mEq/L   Glucose, Bld 113 (*) 70 - 99 mg/dL   BUN 16  6 - 23 mg/dL   Creatinine, Ser 1.61  0.50 - 1.10 mg/dL   Calcium 9.3  8.4 - 09.6 mg/dL   Total Protein 6.1  6.0 - 8.3 g/dL   Albumin 1.9 (*) 3.5 - 5.2 g/dL   AST 18  0 - 37 U/L   ALT 12  0 - 35 U/L   Alkaline Phosphatase 77  39 - 117 U/L   Total Bilirubin 0.2 (*) 0.3 - 1.2 mg/dL   GFR calc non Af Amer 67 (*) >90 mL/min   GFR calc  Af Amer 77 (*) >90 mL/min  CBC WITH DIFFERENTIAL     Status: Abnormal   Collection Time    12/20/12  4:42 AM      Result Value Range   WBC 17.9 (*) 4.0 - 10.5 K/uL   RBC 3.19 (*) 3.87 - 5.11 MIL/uL   Hemoglobin 9.0 (*) 12.0 - 15.0 g/dL   HCT 04.5 (*) 40.9 - 81.1 %   MCV 86.2  78.0 - 100.0 fL   MCH 28.2  26.0 - 34.0 pg   MCHC 32.7  30.0 - 36.0 g/dL   RDW 91.4  78.2 - 95.6 %   Platelets 295  150 - 400 K/uL   Neutrophils Relative 77  43 - 77 %   Lymphocytes Relative 14  12 - 46 %   Monocytes Relative 6  3 - 12 %   Eosinophils Relative 2  0 - 5 %   Basophils Relative 1  0 - 1 %   Neutro Abs 13.7 (*) 1.7 - 7.7 K/uL   Lymphs Abs 2.5  0.7 - 4.0 K/uL   Monocytes Absolute 1.1 (*) 0.1 - 1.0 K/uL   Eosinophils Absolute 0.4  0.0 - 0.7 K/uL   Basophils Absolute 0.2 (*) 0.0 - 0.1 K/uL   RBC Morphology POLYCHROMASIA PRESENT     WBC Morphology MILD LEFT SHIFT (1-5% METAS, OCC MYELO, OCC BANDS)    GLUCOSE, CAPILLARY     Status: Abnormal   Collection Time    12/20/12  5:15 AM      Result Value Range   Glucose-Capillary 100 (*) 70 - 99 mg/dL   Comment 1 Notify RN     Comment 2 Documented in Chart    BLOOD GAS, ARTERIAL  Status: Abnormal   Collection Time    12/20/12  6:10 AM      Result Value Range   FIO2 0.50     Delivery systems VENTILATOR     Mode PRESSURE REGULATED VOLUME CONTROL     VT 430     Rate 12     Peep/cpap 10.0     pH, Arterial 7.419  7.350 - 7.450   pCO2 arterial 45.5 (*) 35.0 - 45.0 mmHg   pO2, Arterial 100.0  80.0 - 100.0 mmHg   Bicarbonate 29.0 (*) 20.0 - 24.0 mEq/L   TCO2 30.4  0 - 100 mmol/L   Acid-Base Excess 4.6 (*) 0.0 - 2.0 mmol/L   O2 Saturation 97.7     Patient temperature 98.0     Collection site A-LINE     Drawn by [id number]     Sample type ARTERIAL     Allens test (pass/fail) PASS  PASS  GLUCOSE, CAPILLARY     Status: Abnormal   Collection Time    12/20/12  7:54 AM      Result Value Range   Glucose-Capillary 109 (*) 70 - 99 mg/dL   Comment 1 Notify  RN       Studies/Results: @[id number] @  . antiseptic oral rinse  15 mL Mouth Rinse QID  . chlorhexidine  15 mL Mouth Rinse BID  . furosemide  60 mg Intravenous BID  . heparin  5,000 Units Subcutaneous Q8H  . insulin aspart  0-15 Units Subcutaneous Q4H  . micafungin (MYCAMINE) IV  100 mg Intravenous Daily  . pantoprazole (PROTONIX) IV  40 mg Intravenous Daily  . piperacillin-tazobactam (ZOSYN)  IV  3.375 g Intravenous Q8H  . sodium chloride  250 mL Intravenous Once     Assessment/Plan: s/p Procedure(s): ABDOMINAL VACUUM ASSISTED CLOSURE CHANGE  POD #5, #3, #1- Laparotomy, extensive lysis of adhesions, resection multiple hernia sacs, small bowel resection, placement of VAC for strangulated, perforated ventral incisional hernia by Dr. Donell Beers. No evidence of infection, ischemia, or leak on reexploration on 12/17/2012. Unable to close the fascia  due to edema, dressing change in OR yesterday, still too much edema  Will start TNA today given prolonged NPO status.  Diurese,  to reduce tissue edema.   Septic shock, Intermedics improving, off the pressors. , Primaxin, vancomycin , mycafungin.  Steroids were discontinued. VDRF. Gas exchange and compliance and satisfactory, Other than high FiO2.. Still at risk for ARDS-like syndrome.  Acute oliguric renal failure due to septic shock, resolving  DM. See insulin drip orders.     LOS: 5 days    Erin Schultz 12/20/2012  .

## 2012-12-20 NOTE — Progress Notes (Addendum)
PULMONARY  / CRITICAL CARE MEDICINE  Name: Erin Schultz MRN: [id number] DOB: 12/07/1965    ADMISSION DATE:  12/15/2012 CONSULTATION DATE:  12/16/12  REFERRING MD :  Donell Beers ([facility])  CHIEF COMPLAINT:  Abdominal pain  BRIEF PATIENT DESCRIPTION: 47 y/o morbidly obese female was admitted on 12/15/12 with an incarcerated abdominal wall hernia and perforated small bowel requiring emergent surgery.  PCCM consulted for post op vent management.  SIGNIFICANT EVENTS / STUDIES:  2/3 - CT abdomen>> large anterior abdominal wall hernia, perforation, and complex fluid collection deep to the bowel; small L kidney, likely poorly functioning R kidney given lack of contrast.  Fatty infiltration of liver 2/3 -  ex-lap, lysis of adhesions, perforation repair, 25cm of bowel resection 2/4 - ARF, limited urine output 2/5 - OR for reexploration - No evidence of infection, ischemia, or leak. Unable to close the fascia due to edema.  2/6 - neg balance, hd cath removed   LINES / TUBES: 2/3 ETT >> 2/3 R IJ CVL >> 2/3 radial a-line >> 2/4 left ij HD>>>2/6  CULTURES: 2/4 blood >>neg 2/3 urine >>contam  ANTIBIOTICS: 2/4 Ertapenem >>2/4 2/4 Micafungin>>>plan stop 2/8 2/4 vancomycin>>>2/6 2/4 Imipenem>>>2/7 2/7 zosyn>>>  SUBJECTIVE: 2/8 - net neg 3600 L overnight, BP stable, afebrile  VITAL SIGNS: Temp:  [98 F (36.7 C)-98.8 F (37.1 C)] 98 F (36.7 C) (02/08 0400) Pulse Rate:  [78-93] 91 (02/08 0700) Resp:  [10-28] 21 (02/08 0700) BP: (90-120)/(54-71) 113/67 mmHg (02/08 0700) SpO2:  [96 %-100 %] 99 % (02/08 0700) Arterial Line BP: (85-146)/(53-78) 129/65 mmHg (02/08 0700) FiO2 (%):  [50 %-80 %] 50 % (02/08 0405) Weight:  [328 lb 7.8 oz (149 kg)] 328 lb 7.8 oz (149 kg) (02/08 0500)  HEMODYNAMICS: CVP:  [11 mmHg-20 mmHg] 11 mmHg  VENTILATOR SETTINGS: Vent Mode:  [-] PRVC FiO2 (%):  [50 %-80 %] 50 % Set Rate:  [12 bmp] 12 bmp Vt Set:  [430 mL] 430 mL PEEP:  [10 cmH20] 10 cmH20 Plateau Pressure:   [17 cmH20-39 cmH20] 17 cmH20  INTAKE / OUTPUT: Intake/Output     02/07 0701 - 02/08 0700 02/08 0701 - 02/09 0700   I.V. (mL/kg) 832.4 (5.6)    NG/GT 60    IV Piggyback 426    Total Intake(mL/kg) 1318.4 (8.8)    Urine (mL/kg/hr) 3990 (1.1)    Emesis/NG output 350 (0.1)    Drains 400 (0.1)    Total Output 4740     Net -3421.6            PHYSICAL EXAMINATION:  Gen: Morbidly obese, rass 0 HEENT:  Obese, EET, line clean PULM: diminished in bases CV: tachy, regular, no mgr, no JVD AB: no bowel sounds, anterior wound with wound vac in place, open Ext: cool, pitting edema in legs improved Derm: wound open Neuro: sedated on vent, rass at goal, follows commands. No focal deficits   LABS:  Recent Labs Lab 12/15/12 1323 12/15/12 2022  12/16/12 0300  12/16/12 1614 12/16/12 1710 12/17/12 0450  12/18/12 0340  12/19/12 0400 12/19/12 0925 12/19/12 1404 12/19/12 1600 12/20/12 0442 12/20/12 0610  HGB 11.8*  --   < > 10.4*  --  10.7*  --  9.1*  --  8.9*  --  8.5*  --   --  9.3* 9.0*  --   WBC 25.0*  --   --  19.3*  --  20.4*  --  18.9*  --  20.6*  --  17.5*  --   --  19.7* 17.9*  --   PLT 394  --   --  306  --  286  --  224  --  248  --  269  --   --  284 295  --   NA 133*  --   < > 133*  --  135  --  138  < > 140  --  142  --   --   --  144  --   K 4.3  --   < > 3.9  --  3.8  --  3.7  < > 4.1  --  3.7  --   --   --  3.7  --   CL 96  --   --  97  --  100  --  102  < > 106  --  107  --   --   --  105  --   CO2 23  --   --  22  --  21  --  24  < > 25  --  27  --   --   --  28  --   GLUCOSE 214*  --   < > 258*  --  225*  --  149*  < > 131*  --  129*  --   --   --  113*  --   BUN 24*  --   --  28*  --  25*  --  17  < > 13  --  16  --   --   --  16  --   CREATININE 1.64*  --   --  2.51*  --  1.95*  --  1.08  < > 0.96  --  0.97  --   --  0.95 0.99  --   CALCIUM 9.8  --   --  8.3*  --  8.1*  --  8.0*  < > 8.2*  --  8.8  --   --   --  9.3  --   MG  --   --   --   --   --   --   --  1.6   --   --   --   --   --   --   --   --   --   PHOS  --   --   --   --   --  2.9  --  2.4  --  2.7  --   --   --   --   --   --   --   AST 21  --   --   --   --   --   --  22  --  21  --   --   --   --   --  18  --   ALT 20  --   --   --   --   --   --  20  --  17  --   --   --   --   --  12  --   ALKPHOS 92  --   --   --   --   --   --  83  --  80  --   --   --   --   --  77  --   BILITOT 0.6  --   --   --   --   --   --  0.4  --  0.2*  --   --   --   --   --  0.2*  --   PROT 8.0  --   --   --   --   --   --  6.1  --  6.1  --   --   --   --   --  6.1  --   ALBUMIN 3.1*  --   --   --   --  2.4*  --  2.0*  --  1.9*  --   --   --   --   --  1.9*  --   APTT  --   --   --   --   --   --   --  36  --   --   --   --   --   --   --   --   --   INR  --  1.20  --   --   --   --   --  1.35  --   --   --   --   --   --   --   --   --   LATICACIDVEN  --   --   --   --   --   --  1.3  --   --   --   --   --   --   --   --   --   --   TROPONINI  --   --   --  <0.30  --   --   --   --   --   --   --   --   --   --   --   --   --   PROCALCITON  --   --   --   --   --   --  52.08 36.83  --  28.34  --   --   --   --   --   --   --   PHART  --   --   < >  --   < >  --   --   --   < >  --   < >  --  7.416 7.408  --   --  7.419  PCO2ART  --   --   < >  --   < >  --   --   --   < >  --   < >  --  39.5 39.7  --   --  45.5*  PO2ART  --   --   < >  --   < >  --   --   --   < >  --   < >  --  70.7* 85.8  --   --  100.0  < > = values in this interval not displayed.  Recent Labs Lab 12/19/12 0740 12/19/12 1206 12/19/12 1543 12/19/12 2040 12/20/12 0007  GLUCAP 111* 101* 93 115* 106*      ASSESSMENT / PLAN:  Principal Problem:   Septic shock(785.52) Active Problems:   Small bowel obstruction   Acute respiratory failure with hypoxia   Hernia, umbilical, with gangrene   Acute peritonitis   Bowel perforation   Severe sepsis(995.92)   Protein-calorie malnutrition, moderate     PULMONARY A: 1) Post op  respiratory failure - Open abdomen,  plan to return to OR for reexploration, closure of abdbomen P:   - Will not extubatable until abdomen closed - pcxr reviewed, continue neg balance - allow neg balance to improve O2 needs, lasix BID - would extubate from ps 8, cpap 8 if needed, given body habitus - will need outpt sleep study  CARDIOVASCULAR A:  1) Shock, presumably septic - Pressors off with stable BP, EGDT completed.  RESOLVED.  P:  - Neg balance tolerated  RENAL A:   1) Oliguric renal failure- likely due to sepsis, improved urine output & SCr P:   - F/u Chem - supp K with lasix - Monitor urine output  GASTROINTESTINAL A:   1) Abdominal hernia, bowel strangulation and perforation now s/p exlap, partial small bowel resection P:   - ppi - NPO - start TPN now - await repeat OR trip for closure, with diuresis  HEMATOLOGIC A:   1) MIld anemia P:  - sub q hep - cbc in am   INFECTIOUS A:   1) Septic shock from peritonitis; CT abdomen with fluid collection worrisome for abscess, but apparently not seen in OR (not noted in op note) P:   - remains culture neg - continue zosyn - stop date added to myco  ENDOCRINE A:  1) DM2 P:   - ICU hyperglycemia protocol   NEUROLOGIC A:   1) Post op pain control 2) Vent synchrony P:   -fentanyl gtt -versed gtt, goal to dc this post op closure   I have personally obtained a history, examined the patient, evaluated laboratory and imaging results, formulated the assessment and plan and placed orders.  CRITICAL CARE: The patient is critically ill with multiple organ systems failure and requires high complexity decision making for assessment and support, frequent evaluation and titration of therapies, application of advanced monitoring technologies and extensive interpretation of multiple databases. Critical Care Time devoted to patient care services described in this note is __40_____ minutes.    Dorcas Carrow Beeper   [phone]  Cell  [phone]  If no response or cell goes to voicemail, call beeper [phone]

## 2012-12-20 NOTE — Progress Notes (Signed)
PARENTERAL NUTRITION CONSULT NOTE - INITIAL  Pharmacy Consult for TPN Indication: Open abdomen s/p hernia repair, small bowel resection; unable to feed enterally  Allergies  Allergen Reactions  . Corn-Containing Products Nausea And Vomiting    Stomach inflamed  . Sulfa Antibiotics     Childhood reaction    Patient Measurements: Height: 5\' 7"  (170.2 cm) Weight: 328 lb 7.8 oz (149 kg) IBW/kg (Calculated) : 61.6  Vital Signs: Temp: 98.4 F (36.9 C) (02/08 0756) Temp src: Oral (02/08 0756) BP: 102/65 mmHg (02/08 1000) Pulse Rate: 90 (02/08 1000) Intake/Output from previous day: 02/07 0701 - 02/08 0700 In: 1318.4 [I.V.:832.4; NG/GT:60; IV Piggyback:426] Out: 4740 [Urine:3990; Emesis/NG output:350; Drains:400] Intake/Output from this shift: Total I/O In: 257.8 [I.V.:111.8; NG/GT:30; IV Piggyback:116] Out: 95 [Urine:95]  Labs:  Recent Labs  12/19/12 0400 12/19/12 1600 12/20/12 0442  WBC 17.5* 19.7* 17.9*  HGB 8.5* 9.3* 9.0*  HCT 26.2* 28.6* 27.5*  PLT 269 284 295     Recent Labs  12/18/12 0340 12/19/12 0400 12/19/12 1600 12/20/12 0442  NA 140 142  --  144  K 4.1 3.7  --  3.7  CL 106 107  --  105  CO2 25 27  --  28  GLUCOSE 131* 129*  --  113*  BUN 13 16  --  16  CREATININE 0.96 0.97 0.95 0.99  CALCIUM 8.2* 8.8  --  9.3  PHOS 2.7  --   --   --   PROT 6.1  --   --  6.1  ALBUMIN 1.9*  --   --  1.9*  AST 21  --   --  18  ALT 17  --   --  12  ALKPHOS 80  --   --  77  BILITOT 0.2*  --   --  0.2*   Estimated Creatinine Clearance: 107.1 ml/min (by C-G formula based on Cr of 0.99).    Recent Labs  12/20/12 0007 12/20/12 0515 12/20/12 0754  GLUCAP 106* 100* 109*    Medical History: Past Medical History  Diagnosis Date  . Hypertension   . Diabetes mellitus without complication   . Chronic kidney disease   . H/O hiatal hernia     Medications:  Scheduled:  . antiseptic oral rinse  15 mL Mouth Rinse QID  . chlorhexidine  15 mL Mouth Rinse BID  .  furosemide  60 mg Intravenous BID  . heparin  5,000 Units Subcutaneous Q8H  . insulin aspart  0-15 Units Subcutaneous Q4H  . [COMPLETED] micafungin (MYCAMINE) IV  100 mg Intravenous Daily  . pantoprazole (PROTONIX) IV  40 mg Intravenous Daily  . piperacillin-tazobactam (ZOSYN)  IV  3.375 g Intravenous Q8H  . [COMPLETED] potassium chloride  10 mEq Intravenous Q1 Hr x 2  . sodium chloride  250 mL Intravenous Once  . [DISCONTINUED] heparin  5,000 Units Subcutaneous Q8H   Infusions:  . sodium chloride 20 mL/hr at 12/20/12 0000  . fentaNYL infusion INTRAVENOUS 275 mcg/hr (12/20/12 1000)  . midazolam (VERSED) infusion 6 mg/hr (12/20/12 1000)    Insulin Requirements in the past 24 hours:  0 units SSI (previously on insulin gtt)  Current Nutrition:  NPO  Assessment: 35 yof admitted 2/3 with an incarcerated abdominal wall hernia and perforated small bowel. Emergently went to the OR for ex-lap, LOA, perforation repair and bowel resection. Back to OR on 2/5 for exploration, unable to close fascia due to edema. Now to start TPN today due to anticipated prolonged  NPO status.  GI: Open abdomen s/p perf repair, small bowel resection - unable to close d/t edema.  Endo: Hx DM (A1c 8.8) - now on SSI without any requirements but previously on an insulin gtt (PTA metformin Lytes: Na 144, K 3.7 today. Phos 2.7 on 2/6, Mg 1.6 on 2/5 Renal: Hx CKD - Scr 0.99, UOP 1.2 Pulm: VDRF - 50% Fio2 Cards: Hx HTN - BP 117/65, HR 90 - no meds (PTA lotrel, triam/hctz) Hepatobil: LFTs WNL, Tbili slightly low Neuro: Sedated on fent/versed gtt - Arousable, GCS 13, RASS 2 (goal 0) ID: Zosyn empiric for peritonitis. Completed micafungin today. Previously on vancomycin + primaxin. Afebrile, WBC elevated at 17.9.  Best Practices: heparin SQ, PPI IV, MC TPN Access: CVC TPN day#: 0  Nutritional Goals:  ~2100-2600 kCal, ~105-130 grams of protein per day >> will f/u RD recommendations and adjust as needed  Plan:  1.  Initiated clinimix E5/15 at 23ml/hr 2. Lipids + MVI + TE on MWF only d/t national shortage 3. F/u AM labs 4. F/u CBGs  5. F/u RD assessment  Terrence Pizana, Drake Leach 12/20/2012,11:13 AM

## 2012-12-20 NOTE — Progress Notes (Signed)
Agree with Above Start TNA Con't to diurese Will need OR next week for possible closure vs. Temporary closure.

## 2012-12-21 ENCOUNTER — Inpatient Hospital Stay (HOSPITAL_COMMUNITY): Payer: BC Managed Care – PPO

## 2012-12-21 LAB — GLUCOSE, CAPILLARY
Glucose-Capillary: 159 mg/dL — ABNORMAL HIGH (ref 70–99)
Glucose-Capillary: 160 mg/dL — ABNORMAL HIGH (ref 70–99)
Glucose-Capillary: 195 mg/dL — ABNORMAL HIGH (ref 70–99)

## 2012-12-21 LAB — PREALBUMIN: Prealbumin: 11.8 mg/dL — ABNORMAL LOW (ref 17.0–34.0)

## 2012-12-21 LAB — CBC
MCH: 27.6 pg (ref 26.0–34.0)
MCV: 86.2 fL (ref 78.0–100.0)
Platelets: 318 10*3/uL (ref 150–400)
RDW: 13.6 % (ref 11.5–15.5)

## 2012-12-21 LAB — TRIGLYCERIDES: Triglycerides: 180 mg/dL — ABNORMAL HIGH (ref <150)

## 2012-12-21 LAB — COMPREHENSIVE METABOLIC PANEL
AST: 33 U/L (ref 0–37)
Albumin: 1.9 g/dL — ABNORMAL LOW (ref 3.5–5.2)
Calcium: 9.5 mg/dL (ref 8.4–10.5)
Creatinine, Ser: 1.07 mg/dL (ref 0.50–1.10)
GFR calc non Af Amer: 61 mL/min — ABNORMAL LOW (ref >90)

## 2012-12-21 LAB — DIFFERENTIAL
Basophils Absolute: 0.2 10*3/uL — ABNORMAL HIGH (ref 0.0–0.1)
Eosinophils Relative: 2 % (ref 0–5)
Monocytes Absolute: 1.3 10*3/uL — ABNORMAL HIGH (ref 0.1–1.0)
Monocytes Relative: 7 % (ref 3–12)
Neutrophils Relative %: 78 % — ABNORMAL HIGH (ref 43–77)

## 2012-12-21 LAB — MAGNESIUM: Magnesium: 1.7 mg/dL (ref 1.5–2.5)

## 2012-12-21 LAB — PHOSPHORUS: Phosphorus: 4.7 mg/dL — ABNORMAL HIGH (ref 2.3–4.6)

## 2012-12-21 MED ORDER — DEXTROSE 10 % IV SOLN
INTRAVENOUS | Status: AC
  Administered 2012-12-21 – 2012-12-22 (×2): via INTRAVENOUS

## 2012-12-21 MED ORDER — CLINIMIX E/DEXTROSE (5/15) 5 % IV SOLN
INTRAVENOUS | Status: DC
  Administered 2012-12-21: 18:00:00 via INTRAVENOUS
  Filled 2012-12-21: qty 3000

## 2012-12-21 NOTE — Progress Notes (Signed)
A-line no longer reading, would not draw back blood. Respiratory therapist verified. Dr. Craige Cotta notified and gave order to take out and not replace.

## 2012-12-21 NOTE — Progress Notes (Signed)
Right IJ TLC dressing noted to be wet, prepared to change dressing. Once old dressing removed, it appeared that the central line had been pulled back and IV fluids were leaking at the site.  IV fluids stopped. Dr Marin Shutter notified, orders received to start PIV and start D10 until central line able to be placed. Family notified and consent obtained to place a new central line.

## 2012-12-21 NOTE — Progress Notes (Signed)
2 Days Post-Op  Subjective: Awake and alert on vent. Denies pain  Objective: Vital signs in last 24 hours: Temp:  [97.9 F (36.6 C)-99.4 F (37.4 C)] 97.9 F (36.6 C) (02/09 0809) Pulse Rate:  [77-107] 79 (02/09 0700) Resp:  [19-26] 24 (02/09 0700) BP: (89-122)/(51-76) 111/62 mmHg (02/09 0700) SpO2:  [95 %-99 %] 97 % (02/09 0700) Arterial Line BP: (102-146)/(57-76) 110/59 mmHg (02/09 0700) FiO2 (%):  [40 %-50 %] 40 % (02/09 0738) Weight:  [315 lb 4.1 oz (143 kg)] 315 lb 4.1 oz (143 kg) (02/09 0100)    Intake/Output from previous day: 02/08 0701 - 02/09 0700 In: 1830.8 [I.V.:878.8; NG/GT:120; IV Piggyback:272; TPN:560] Out: 4885 [Urine:3785; Emesis/NG output:800; Drains:300] Intake/Output this shift:    GI: soft, obese. vac dressing intact  Lab Results:   Recent Labs  12/20/12 0442 12/21/12 0400  WBC 17.9* 18.2*  HGB 9.0* 9.4*  HCT 27.5* 29.4*  PLT 295 318   BMET  Recent Labs  12/20/12 0442 12/21/12 0400  NA 144 142  K 3.7 3.6  CL 105 100  CO2 28 30  GLUCOSE 113* 165*  BUN 16 17  CREATININE 0.99 1.07  CALCIUM 9.3 9.5   PT/INR No results found for this basename: LABPROT, INR,  in the last 72 hours ABG  Recent Labs  12/19/12 1404 12/20/12 0610  PHART 7.408 7.419  HCO3 24.5* 29.0*    Studies/Results: Dg Chest Port 1 View  12/21/2012  *RADIOLOGY REPORT*  Clinical Data: Follow up endotracheal tube placement  PORTABLE CHEST - 1 VIEW  Comparison: Prior chest x-ray 12/20/2012  Findings: Stable position of endotracheal tube 3 cm above the carina.  Right IJ central venous catheter also in unchanged position with the tip in the upper superior vena cava.  The appearance of the lungs is stable.  Very low inspiratory volumes with mild central vascular congestion and bibasilar opacities likely reflecting atelectasis.  Superimposed consolidation is difficult to exclude.  IMPRESSION:  No significant interval change in the appearance of the chest.   Original Report  Authenticated By: Malachy Moan, M.D.    Dg Chest Port 1 View  12/20/2012  *RADIOLOGY REPORT*  Clinical Data: Check endotracheal tube  PORTABLE CHEST - 1 VIEW  Comparison: Prior chest x-ray 12/19/2012  Findings: The tip of the endotracheal tube is in good position 2.8 cm above the carina.  Stable position of right IJ central venous catheter and visualized portion of the nasogastric tube.  Decreased interstitial edema with persistent pulmonary vascular congestion. Inspiratory volumes remain very low.  Bibasilar opacities are decreasing, likely resolving atelectasis.  IMPRESSION:  1.  Stable and satisfactory support apparatus. 2.  Decreased pulmonary edema with persistent vascular congestion. 3.  Improving bibasilar opacities likely resolving atelectasis.   Original Report Authenticated By: Malachy Moan, M.D.     Anti-infectives: Anti-infectives   Start     Dose/Rate Route Frequency Ordered Stop   12/19/12 1000  piperacillin-tazobactam (ZOSYN) IVPB 3.375 g     3.375 g 12.5 mL/hr over 240 Minutes Intravenous 3 times per day 12/19/12 0940     12/18/12 1800  imipenem-cilastatin (PRIMAXIN) 500 mg in sodium chloride 0.9 % 100 mL IVPB  Status:  Discontinued     500 mg 200 mL/hr over 30 Minutes Intravenous 4 times per day 12/18/12 1402 12/19/12 0923   12/18/12 0900  vancomycin (VANCOCIN) 2,000 mg in sodium chloride 0.9 % 500 mL IVPB  Status:  Discontinued     2,000 mg 250 mL/hr over 120  Minutes Intravenous Every 48 hours 12/16/12 0927 12/17/12 1236   12/17/12 1400  vancomycin (VANCOCIN) 2,000 mg in sodium chloride 0.9 % 500 mL IVPB  Status:  Discontinued     2,000 mg 250 mL/hr over 120 Minutes Intravenous Every 24 hours 12/17/12 1236 12/18/12 1005   12/17/12 1000  micafungin (MYCAMINE) 100 mg in sodium chloride 0.9 % 100 mL IVPB     100 mg 100 mL/hr over 1 Hours Intravenous Daily 12/16/12 0908 12/20/12 1059   12/16/12 2300  ertapenem (INVANZ) 1 g in sodium chloride 0.9 % 50 mL IVPB  Status:   Discontinued     1 g 100 mL/hr over 30 Minutes Intravenous Every 24 hours 12/16/12 0230 12/16/12 0908   12/16/12 1200  imipenem-cilastatin (PRIMAXIN) 250 mg in sodium chloride 0.9 % 100 mL IVPB  Status:  Discontinued     250 mg 200 mL/hr over 30 Minutes Intravenous 4 times per day 12/16/12 0927 12/18/12 1402   12/16/12 1000  micafungin (MYCAMINE) 100 mg in sodium chloride 0.9 % 100 mL IVPB     100 mg 100 mL/hr over 1 Hours Intravenous Daily 12/16/12 0908 12/16/12 1239   12/16/12 0930  vancomycin (VANCOCIN) 2,500 mg in sodium chloride 0.9 % 500 mL IVPB     2,500 mg 250 mL/hr over 120 Minutes Intravenous  Once 12/16/12 0927 12/16/12 1334   12/16/12 0600  ertapenem (INVANZ) 1 g in sodium chloride 0.9 % 50 mL IVPB     1 g 100 mL/hr over 30 Minutes Intravenous On call to O.R. 12/15/12 2129 12/15/12 2254   12/15/12 2015  [MAR Hold]  piperacillin-tazobactam (ZOSYN) IVPB 3.375 g     (On MAR Hold since 12/15/12 2236)   3.375 g 12.5 mL/hr over 240 Minutes Intravenous  Once 12/15/12 2006 12/16/12 0034      Assessment/Plan: s/p Procedure(s): EXPLORATORY LAPAROTOMY (N/A) ABDOMINAL VACUUM ASSISTED CLOSURE CHANGE (N/A) Continue bowel rest and NG Continue IV abx Plan for vac change in OR tomorrow  LOS: 6 days    TOTH III,PAUL S 12/21/2012

## 2012-12-21 NOTE — Progress Notes (Signed)
PULMONARY  / CRITICAL CARE MEDICINE  Name: Erin Schultz MRN: [id number] DOB: Jul 25, 1966    ADMISSION DATE:  12/15/2012 CONSULTATION DATE:  12/16/12  REFERRING MD :  Donell Beers (CCS)  CHIEF COMPLAINT:  Abdominal pain  BRIEF PATIENT DESCRIPTION: 47 y/o morbidly obese female was admitted on 12/15/12 with an incarcerated abdominal wall hernia and perforated small bowel requiring emergent surgery.  PCCM consulted for post op vent management.  SIGNIFICANT EVENTS / STUDIES:  2/3 - CT abdomen>> large anterior abdominal wall hernia, perforation, and complex fluid collection deep to the bowel; small L kidney, likely poorly functioning R kidney given lack of contrast.  Fatty infiltration of liver 2/3 -  ex-lap, lysis of adhesions, perforation repair, 25cm of bowel resection 2/4 - ARF, limited urine output 2/5 - OR for reexploration - No evidence of infection, ischemia, or leak. Unable to close the fascia due to edema.  2/6 - neg balance, hd cath removed 2/8 - TNA started, net neg 3600 L 2/9 - stable, VAC remains in place  LINES / TUBES: 2/3 ETT >> 2/3 R IJ CVL >> 2/3 radial a-line >> 2/4 left ij HD>>>2/6  CULTURES: 2/4 blood >>neg 2/3 urine >>contam  ANTIBIOTICS: 2/4 Ertapenem >>2/4 2/4 Micafungin>>>2/8 2/4 vancomycin>>>2/6 2/4 Imipenem>>>2/7 2/7 zosyn>>>  SUBJECTIVE: 2/9 - no distress, awake / alert  VITAL SIGNS: Temp:  [97.7 F (36.5 C)-98.8 F (37.1 C)] 97.7 F (36.5 C) (02/09 1148) Pulse Rate:  [77-107] 81 (02/09 1100) Resp:  [19-26] 21 (02/09 1100) BP: (89-117)/(51-65) 94/58 mmHg (02/09 1100) SpO2:  [95 %-99 %] 98 % (02/09 1100) Arterial Line BP: (102-140)/(57-76) 126/65 mmHg (02/09 1100) FiO2 (%):  [40 %] 40 % (02/09 1147) Weight:  [315 lb 4.1 oz (143 kg)] 315 lb 4.1 oz (143 kg) (02/09 0100)  HEMODYNAMICS: CVP:  [11 mmHg-13 mmHg] 13 mmHg  VENTILATOR SETTINGS: Vent Mode:  [-] PRVC FiO2 (%):  [40 %] 40 % Set Rate:  [12 bmp] 12 bmp Vt Set:  [430 mL] 430 mL PEEP:  [8  cmH20] 8 cmH20 Plateau Pressure:  [20 cmH20] 20 cmH20  INTAKE / OUTPUT: Intake/Output     02/08 0701 - 02/09 0700 02/09 0701 - 02/10 0700   I.V. (mL/kg) 878.8 (6.1) 155 (1.1)   NG/GT 120    IV Piggyback 272 16   TPN 560 200   Total Intake(mL/kg) 1830.8 (12.8) 371 (2.6)   Urine (mL/kg/hr) 3785 (1.1) 1850 (2.6)   Emesis/NG output 800 (0.2) 200 (0.3)   Drains 300 (0.1) 100 (0.1)   Total Output 4885 2150   Net -3054.3 -1779        Urine Occurrence 2 x      PHYSICAL EXAMINATION:  Gen: Morbidly obese, rass 0 HEENT:  Obese, EET, line clean PULM: diminished in bases CV: tachy, regular, no mgr, no JVD AB: no bowel sounds, anterior wound with wound vac in place Ext: cool, pitting edema in legs improved Derm: wound open Neuro: sedated on vent, rass at goal, follows commands. No focal deficits   LABS:  Recent Labs Lab 12/15/12 1323 12/15/12 2022  12/16/12 0300  12/16/12 1614 12/16/12 1710 12/17/12 0450  12/18/12 0340  12/19/12 0400 12/19/12 0925 12/19/12 1404 12/19/12 1600 12/20/12 0442 12/20/12 0610 12/21/12 0400  HGB 11.8*  --   < > 10.4*  --  10.7*  --  9.1*  --  8.9*  --  8.5*  --   --  9.3* 9.0*  --  9.4*  WBC 25.0*  --   --  19.3*  --  20.4*  --  18.9*  --  20.6*  --  17.5*  --   --  19.7* 17.9*  --  18.2*  PLT 394  --   --  306  --  286  --  224  --  248  --  269  --   --  284 295  --  318  NA 133*  --   < > 133*  --  135  --  138  < > 140  --  142  --   --   --  144  --  142  K 4.3  --   < > 3.9  --  3.8  --  3.7  < > 4.1  --  3.7  --   --   --  3.7  --  3.6  CL 96  --   --  97  --  100  --  102  < > 106  --  107  --   --   --  105  --  100  CO2 23  --   --  22  --  21  --  24  < > 25  --  27  --   --   --  28  --  30  GLUCOSE 214*  --   < > 258*  --  225*  --  149*  < > 131*  --  129*  --   --   --  113*  --  165*  BUN 24*  --   --  28*  --  25*  --  17  < > 13  --  16  --   --   --  16  --  17  CREATININE 1.64*  --   --  2.51*  --  1.95*  --  1.08  < > 0.96   --  0.97  --   --  0.95 0.99  --  1.07  CALCIUM 9.8  --   --  8.3*  --  8.1*  --  8.0*  < > 8.2*  --  8.8  --   --   --  9.3  --  9.5  MG  --   --   --   --   --   --   --  1.6  --   --   --   --   --   --   --   --   --  1.7  PHOS  --   --   --   --   < > 2.9  --  2.4  --  2.7  --   --   --   --   --   --   --  4.7*  AST 21  --   --   --   --   --   --  22  --  21  --   --   --   --   --  18  --  33  ALT 20  --   --   --   --   --   --  20  --  17  --   --   --   --   --  12  --  17  ALKPHOS 92  --   --   --   --   --   --  83  --  80  --   --   --   --   --  77  --  87  BILITOT 0.6  --   --   --   --   --   --  0.4  --  0.2*  --   --   --   --   --  0.2*  --  0.3  PROT 8.0  --   --   --   --   --   --  6.1  --  6.1  --   --   --   --   --  6.1  --  6.5  ALBUMIN 3.1*  --   --   --   --  2.4*  --  2.0*  --  1.9*  --   --   --   --   --  1.9*  --  1.9*  APTT  --   --   --   --   --   --   --  36  --   --   --   --   --   --   --   --   --   --   INR  --  1.20  --   --   --   --   --  1.35  --   --   --   --   --   --   --   --   --   --   LATICACIDVEN  --   --   --   --   --   --  1.3  --   --   --   --   --   --   --   --   --   --   --   TROPONINI  --   --   --  <0.30  --   --   --   --   --   --   --   --   --   --   --   --   --   --   PROCALCITON  --   --   --   --   --   --  52.08 36.83  --  28.34  --   --   --   --   --   --   --   --   PHART  --   --   < >  --   < >  --   --   --   < >  --   < >  --  7.416 7.408  --   --  7.419  --   PCO2ART  --   --   < >  --   < >  --   --   --   < >  --   < >  --  39.5 39.7  --   --  45.5*  --   PO2ART  --   --   < >  --   < >  --   --   --   < >  --   < >  --  70.7* 85.8  --   --  100.0  --   < > = values in this interval not displayed.  Recent Labs Lab 12/20/12 1606 12/20/12 1948 12/21/12 0016 12/21/12 0401 12/21/12 0806  GLUCAP 108* 131* 137*  161* 155*      ASSESSMENT / PLAN:  Principal Problem:   Septic shock(785.52) Active  Problems:   Small bowel obstruction   Acute respiratory failure with hypoxia   Hernia, umbilical, with gangrene   Acute peritonitis   Bowel perforation   Severe sepsis(995.92)   Protein-calorie malnutrition, moderate   PULMONARY A: 1) Post op respiratory failure - Open abdomen, plan to return to OR for reexploration, closure of abdomen P:   - Will not extubate until abdomen closed - pcxr reviewed, continue neg balance - allow neg balance to improve O2 needs, lasix BID - would extubate from ps 8, cpap 8 if needed, given body habitus - will need outpt sleep study - follow up CXR 2/11  CARDIOVASCULAR A:  1) Shock, presumably septic - Pressors off with stable BP, EGDT completed.  RESOLVED.  P:  - Neg balance tolerated  RENAL A:   1) Oliguric renal failure- likely due to sepsis, improved urine output & SCr.  Renal function significantly improved 2/9.   P:   - F/u Chem - supp K with lasix - Monitor urine output  GASTROINTESTINAL A:   1) Abdominal hernia, bowel strangulation and perforation now s/p exlap, partial small bowel resection P:   - ppi - NPO - cont TPN  - await repeat OR trip for closure  HEMATOLOGIC A:   1) MIld anemia P:  - sub q hep - cbc in am   INFECTIOUS A:   1) Septic shock from peritonitis; CT abdomen with fluid collection worrisome for abscess, but apparently not seen in OR (not noted in op note)  P:   - remains culture neg - continue zosyn   ENDOCRINE A:  1) DM2 P:   - ICU hyperglycemia protocol   NEUROLOGIC A:   1) Post op pain control 2) Vent synchrony  P:   -fentanyl gtt -versed gtt, goal to dc this post op closure   I have personally obtained a history, examined the patient, evaluated laboratory and imaging results, formulated the assessment and plan and placed orders.  CRITICAL CARE: The patient is critically ill with multiple organ systems failure and requires high complexity decision making for assessment and support,  frequent evaluation and titration of therapies, application of advanced monitoring technologies and extensive interpretation of multiple databases. Critical Care Time devoted to patient care services described in this note is ____35_ minutes.    Dorcas Carrow  Beeper  [phone]  Cell  [phone]  If no response or cell goes to voicemail, call beeper [phone]

## 2012-12-21 NOTE — Progress Notes (Signed)
PARENTERAL NUTRITION CONSULT NOTE - Follow-up  Pharmacy Consult for TPN Indication: Open abdomen s/p hernia repair, small bowel resection; unable to feed enterally  Allergies  Allergen Reactions  . Corn-Containing Products Nausea And Vomiting    Stomach inflamed  . Sulfa Antibiotics     Childhood reaction    Patient Measurements: Height: 5\' 7"  (170.2 cm) Weight: 315 lb 4.1 oz (143 kg) IBW/kg (Calculated) : 61.6  Vital Signs: Temp: 98.4 F (36.9 C) (02/09 0400) Temp src: Oral (02/09 0400) BP: 111/62 mmHg (02/09 0700) Pulse Rate: 79 (02/09 0700) Intake/Output from previous day: 02/08 0701 - 02/09 0700 In: 1830.8 [I.V.:878.8; NG/GT:120; IV Piggyback:272; TPN:560] Out: 4885 [Urine:3785; Emesis/NG output:800; Drains:300] Intake/Output from this shift:    Labs:  Recent Labs  12/19/12 1600 12/20/12 0442 12/21/12 0400  WBC 19.7* 17.9* 18.2*  HGB 9.3* 9.0* 9.4*  HCT 28.6* 27.5* 29.4*  PLT 284 295 318     Recent Labs  12/19/12 0400 12/19/12 1600 12/20/12 0442 12/21/12 0400  NA 142  --  144 142  K 3.7  --  3.7 3.6  CL 107  --  105 100  CO2 27  --  28 30  GLUCOSE 129*  --  113* 165*  BUN 16  --  16 17  CREATININE 0.97 0.95 0.99 1.07  CALCIUM 8.8  --  9.3 9.5  MG  --   --   --  1.7  PHOS  --   --   --  4.7*  PROT  --   --  6.1 6.5  ALBUMIN  --   --  1.9* 1.9*  AST  --   --  18 33  ALT  --   --  12 17  ALKPHOS  --   --  77 87  BILITOT  --   --  0.2* 0.3  TRIG  --   --   --  180*  CHOL  --   --   --  131   Estimated Creatinine Clearance: 96.7 ml/min (by C-G formula based on Cr of 1.07).    Recent Labs  12/20/12 1948 12/21/12 0016 12/21/12 0401  GLUCAP 131* 137* 161*   Insulin Requirements in the past 24 hours:  7 units SSI   Current Nutrition:  Clinimix E5/15 at 27ml/hr  Assessment: 17 yof admitted 2/3 with an incarcerated abdominal wall hernia and perforated small bowel. Emergently went to the OR for ex-lap, LOA, perforation repair and bowel  resection. Back to OR on 2/5 for exploration, unable to close fascia due to edema. TPN initiated 2/8 due to anticipated prolonged NPO status.  GI: Open abdomen s/p perf repair, small bowel resection - unable to close d/t edema. Plan to return to the OR next week for possible closure. +872ml NG output, +345ml drain output, net -3L Endo: Hx DM (A1c 8.8) - CBGs 131-165 on SSI. Had been on an insulin gtt earlier in her admission (PTA metformin) Lytes: Na 142, K 3.7, Phos 4.7 (very slightly high), Mg 1.7, Corr Ca 11.18 (Ca x Phos <55). Started on schedule lasix Renal: Hx CKD - Scr 1.07, UOP 1.1 Pulm: VDRF - 40% Fio2 Cards: Hx HTN - BP 110/59, HR 79 - no meds (PTA lotrel, triam/hctz) Hepatobil: LFTs + Tbili WNL. Trig 180, TC 131, CBC stable Neuro: Sedated on fent/versed gtt - Arousable, GCS 13, RASS -1 (goal 0) ID: Zosyn empiric for peritonitis. Completed micafungin today. Previously on vancomycin + primaxin. Afebrile, WBC elevated at 18.2.  Best  Practices: heparin SQ, PPI IV, MC TPN Access: CVC TPN day#: 0  Nutritional Goals:  2607 Kcal/day (Underfeeding goal: 9528-4132 kcal/day) 134-170gm/day >/= 1.5L/day  Plan:  1. Increase clinimix E5/15 to 145ml/hr which will provide 120 gm protein/day >>92% goal and an average of 1909 kcal/day>>105% underfeeding goal (may need to consider removing electrolytes tomorrow if phos continues to increase but currently, minimally elevated.) 2. Lipids + MVI + TE on MWF only d/t national shortage 3. F/u AM labs 4. F/u CBGs   Krystian Younglove, Drake Leach 12/21/2012,8:06 AM

## 2012-12-22 ENCOUNTER — Encounter (HOSPITAL_COMMUNITY): Payer: Self-pay | Admitting: Certified Registered Nurse Anesthetist

## 2012-12-22 ENCOUNTER — Inpatient Hospital Stay (HOSPITAL_COMMUNITY): Payer: BC Managed Care – PPO | Admitting: Certified Registered Nurse Anesthetist

## 2012-12-22 ENCOUNTER — Inpatient Hospital Stay (HOSPITAL_COMMUNITY): Payer: BC Managed Care – PPO

## 2012-12-22 ENCOUNTER — Encounter (HOSPITAL_COMMUNITY)
Admission: EM | Disposition: A | Discharge status: Home Health | Payer: Self-pay | Source: Home / Self Care | Attending: Pulmonary Disease

## 2012-12-22 DIAGNOSIS — R6520 Severe sepsis without septic shock: Secondary | ICD-10-CM

## 2012-12-22 DIAGNOSIS — J9600 Acute respiratory failure, unspecified whether with hypoxia or hypercapnia: Secondary | ICD-10-CM

## 2012-12-22 DIAGNOSIS — S31109A Unspecified open wound of abdominal wall, unspecified quadrant without penetration into peritoneal cavity, initial encounter: Secondary | ICD-10-CM

## 2012-12-22 DIAGNOSIS — R6521 Severe sepsis with septic shock: Secondary | ICD-10-CM

## 2012-12-22 DIAGNOSIS — A419 Sepsis, unspecified organism: Secondary | ICD-10-CM

## 2012-12-22 HISTORY — PX: VACUUM ASSISTED CLOSURE CHANGE: SHX5227

## 2012-12-22 LAB — COMPREHENSIVE METABOLIC PANEL
ALT: 26 U/L (ref 0–35)
Alkaline Phosphatase: 105 U/L (ref 39–117)
CO2: 36 mEq/L — ABNORMAL HIGH (ref 19–32)
GFR calc Af Amer: 77 mL/min — ABNORMAL LOW (ref >90)
GFR calc non Af Amer: 66 mL/min — ABNORMAL LOW (ref >90)
Glucose, Bld: 187 mg/dL — ABNORMAL HIGH (ref 70–99)
Potassium: 3.4 mEq/L — ABNORMAL LOW (ref 3.5–5.1)
Sodium: 143 mEq/L (ref 135–145)

## 2012-12-22 LAB — GLUCOSE, CAPILLARY
Glucose-Capillary: 165 mg/dL — ABNORMAL HIGH (ref 70–99)
Glucose-Capillary: 195 mg/dL — ABNORMAL HIGH (ref 70–99)
Glucose-Capillary: 196 mg/dL — ABNORMAL HIGH (ref 70–99)
Glucose-Capillary: 229 mg/dL — ABNORMAL HIGH (ref 70–99)

## 2012-12-22 LAB — DIFFERENTIAL
Basophils Absolute: 0.2 10*3/uL — ABNORMAL HIGH (ref 0.0–0.1)
Eosinophils Absolute: 0.5 10*3/uL (ref 0.0–0.7)
Lymphs Abs: 2.6 10*3/uL (ref 0.7–4.0)
Monocytes Relative: 7 % (ref 3–12)
Neutro Abs: 12 10*3/uL — ABNORMAL HIGH (ref 1.7–7.7)

## 2012-12-22 LAB — CBC
HCT: 31.3 % — ABNORMAL LOW (ref 36.0–46.0)
MCH: 27.8 pg (ref 26.0–34.0)
MCHC: 31.6 g/dL (ref 30.0–36.0)
MCV: 87.9 fL (ref 78.0–100.0)
RDW: 13.4 % (ref 11.5–15.5)

## 2012-12-22 LAB — CULTURE, BLOOD (ROUTINE X 2): Culture: NO GROWTH

## 2012-12-22 SURGERY — REPLACEMENT, WOUND VAC DRESSING, ABDOMEN
Anesthesia: General | Site: Abdomen | Wound class: Dirty or Infected

## 2012-12-22 MED ORDER — POTASSIUM CHLORIDE 10 MEQ/50ML IV SOLN
10.0000 meq | INTRAVENOUS | Status: AC
  Administered 2012-12-22 (×4): 10 meq via INTRAVENOUS
  Filled 2012-12-22 (×4): qty 50

## 2012-12-22 MED ORDER — FAT EMULSION 20 % IV EMUL
250.0000 mL | INTRAVENOUS | Status: AC
  Administered 2012-12-22: 250 mL via INTRAVENOUS
  Filled 2012-12-22: qty 250

## 2012-12-22 MED ORDER — ROCURONIUM BROMIDE 100 MG/10ML IV SOLN
INTRAVENOUS | Status: DC | PRN
  Administered 2012-12-22: 50 mg via INTRAVENOUS

## 2012-12-22 MED ORDER — EPHEDRINE SULFATE 50 MG/ML IJ SOLN
INTRAMUSCULAR | Status: DC | PRN
  Administered 2012-12-22 (×2): 10 mg via INTRAVENOUS

## 2012-12-22 MED ORDER — MIDAZOLAM HCL 5 MG/5ML IJ SOLN
INTRAMUSCULAR | Status: DC | PRN
  Administered 2012-12-22: 2 mg via INTRAVENOUS

## 2012-12-22 MED ORDER — SODIUM CHLORIDE 0.9 % IV SOLN
50.0000 mg | INTRAVENOUS | Status: DC | PRN
  Administered 2012-12-22: 4 mg/h via INTRAVENOUS

## 2012-12-22 MED ORDER — TRACE MINERALS CR-CU-F-FE-I-MN-MO-SE-ZN IV SOLN
INTRAVENOUS | Status: AC
  Administered 2012-12-22: 17:00:00 via INTRAVENOUS
  Filled 2012-12-22: qty 2400

## 2012-12-22 MED ORDER — 0.9 % SODIUM CHLORIDE (POUR BTL) OPTIME
TOPICAL | Status: DC | PRN
  Administered 2012-12-22 (×2): 1000 mL

## 2012-12-22 MED ORDER — SODIUM CHLORIDE 0.9 % IV SOLN
INTRAVENOUS | Status: DC | PRN
  Administered 2012-12-22: 10:00:00 via INTRAVENOUS

## 2012-12-22 MED ORDER — VECURONIUM BROMIDE 10 MG IV SOLR
INTRAVENOUS | Status: DC | PRN
  Administered 2012-12-22: 5 mg via INTRAVENOUS

## 2012-12-22 MED ORDER — SODIUM CHLORIDE 0.9 % IV SOLN
2500.0000 ug | INTRAVENOUS | Status: DC | PRN
  Administered 2012-12-22: 175 ug/h via INTRAVENOUS
  Administered 2012-12-22: 11:00:00 via INTRAVENOUS

## 2012-12-22 SURGICAL SUPPLY — 31 items
BINDER ABD UNIV 12 30-45 (MISCELLANEOUS) ×1 IMPLANT
BINDER ABDOMINAL 12 (MISCELLANEOUS) ×2
CANISTER SUCT LVC 12 LTR MEDI- (MISCELLANEOUS) IMPLANT
CANISTER SUCTION 2500CC (MISCELLANEOUS) ×2 IMPLANT
CANISTER WOUND CARE 500ML ATS (WOUND CARE) ×2 IMPLANT
CLOTH BEACON ORANGE TIMEOUT ST (SAFETY) ×2 IMPLANT
COVER SURGICAL LIGHT HANDLE (MISCELLANEOUS) ×2 IMPLANT
DRAPE LAPAROSCOPIC ABDOMINAL (DRAPES) ×2 IMPLANT
DRAPE UTILITY 15X26 W/TAPE STR (DRAPE) ×4 IMPLANT
ELECT REM PT RETURN 9FT ADLT (ELECTROSURGICAL) ×2
ELECTRODE REM PT RTRN 9FT ADLT (ELECTROSURGICAL) ×1 IMPLANT
GLOVE BIO SURGEON STRL SZ7.5 (GLOVE) ×2 IMPLANT
GLOVE BIOGEL PI IND STRL 7.5 (GLOVE) ×2 IMPLANT
GLOVE BIOGEL PI IND STRL 8 (GLOVE) ×1 IMPLANT
GLOVE BIOGEL PI INDICATOR 7.5 (GLOVE) ×2
GLOVE BIOGEL PI INDICATOR 8 (GLOVE) ×1
GLOVE ECLIPSE 7.0 STRL STRAW (GLOVE) ×2 IMPLANT
GLOVE SURG SIGNA 7.5 PF LTX (GLOVE) ×2 IMPLANT
GOWN STRL NON-REIN LRG LVL3 (GOWN DISPOSABLE) ×4 IMPLANT
GOWN STRL REIN XL XLG (GOWN DISPOSABLE) ×2 IMPLANT
KIT BASIN OR (CUSTOM PROCEDURE TRAY) ×2 IMPLANT
KIT ROOM TURNOVER OR (KITS) ×2 IMPLANT
NS IRRIG 1000ML POUR BTL (IV SOLUTION) ×4 IMPLANT
PACK GENERAL/GYN (CUSTOM PROCEDURE TRAY) ×2 IMPLANT
PAD ARMBOARD 7.5X6 YLW CONV (MISCELLANEOUS) ×4 IMPLANT
SPONGE ABDOMINAL VAC ABTHERA (MISCELLANEOUS) ×2 IMPLANT
SUCTION POOLE TIP (SUCTIONS) ×2 IMPLANT
SUT NOVA 1 T20/GS 25DT (SUTURE) ×4 IMPLANT
TOWEL OR 17X24 6PK STRL BLUE (TOWEL DISPOSABLE) ×2 IMPLANT
TOWEL OR 17X26 10 PK STRL BLUE (TOWEL DISPOSABLE) ×2 IMPLANT
WATER STERILE IRR 1000ML POUR (IV SOLUTION) ×2 IMPLANT

## 2012-12-22 NOTE — Care Management Note (Signed)
    Page 1 of 2   12/30/2012     4:09:37 PM   CARE MANAGEMENT NOTE 12/30/2012  Patient:  Erin Schultz, Erin Schultz   Account Number:  [id number]  Date Initiated:  12/16/2012  Documentation initiated by:  Avie Arenas  Subjective/Objective Assessment:   Admitted with abd pain - Postop -  incarcerated perforated ventral incisional hernia, lysis of adhesions, 25 cm bowel resection, shock, VDRF and requirement for high dose pressors. She is oligoanuric.     Action/Plan:   PTA, PT LIVED AT HOME ALONE, BUT WILL STAY WITH SISTER AT DC.  MOTHER AND SISTER TO PROVIDE CARE AT DC.   Anticipated DC Date:  12/30/2012   Anticipated DC Plan:  HOME W HOME HEALTH SERVICES      DC Planning Services  CM consult      [facility] Choice  HOME HEALTH   Choice offered to / List presented to:  C-1 Patient   DME arranged  VAC  BEDSIDE COMMODE  WALKER - WIDE      DME agency  [employer]  [facility]     HH arranged  HH-1 RN  HH-2 PT  HH-3 OT      [facility] agency  [facility]   Status of service:  Completed, signed off Medicare Important Message given?   (If response is "NO", the following Medicare IM given date fields will be blank) Date Medicare IM given:   Date Additional Medicare IM given:    Discharge Disposition:  HOME W HOME HEALTH SERVICES  Per UR Regulation:  Reviewed for med. necessity/level of care/duration of stay  If discussed at Long Length of Stay Meetings, dates discussed:   12/30/2012    Comments:  ContactAubreyana, Erin Schultz Mother [phone]  12/30/12 Erin Mairena,RN,BSN [phone] PT FOR DC HOME TODAY.  NOTIFIED BY [employer] THAT WOUND VAC MAY TAKE UP TO 14 DAYS TO BE APPROVED BY INSURANCE CARRIER. WOUND VAC CAN STILL BE RELEASED AND PT DC'D HOME WITH UNDERSTANDING THAT IF NOT APPROVED, PT WILL BE RESPONSIBLE FOR VAC.  EXPLAINED THIS TO PT, AND SHE IS AGREEABLE TO THIS.  DISCUSSED HOME CARE OPTIONS; PT CHOOSES [facility] FOR HOME CARE FOLLOW UP.  START OF CARE 24-48H POST DC DATE.  REFERRAL TO [facility] FOR DME NEEDS.  MET WITH PT AND FAMILY, AS THEY ARE SOMEWHAT OVERWHELMED WITH PT GOING HOME.  PROVIDED EMOTIONAL SUPPORT, DISCUSSED HOME CARE SCHEDULE.  PT PLEASED WITH HER PROGRESS.  12/29/12 Erin Mcfarren,RN,BSN [phone] MET WITH PT AND FAMILY TO DISCUSS DC PLANS.  PT DOING TOO WELL FOR CIR.  PT WISHES TO GO HOME; FAMILY CAN PROVIDE 24HR SUPERVISION AT HOME.  WILL NEED HOME WOUND VAC; WOULD RECOMMEND HHRN/PT/OT AND AIDE.  [employer] HOME VAC INSURANCE AUTH FORMS COMPLETED, SIGNED BY PA, AND FAXED TO [employer].  WILL DISCUSS HOME HEALTH PROVIDERS WITH PT/FAMILY IN AM.   12-22-12 10:45am Avie Arenas, RNBSN [phone] Back to OR today - hoping for closure of abd and VAC removal.  Depending on progression - may be [facility] - insurance in network for [facility] or CIR.  12-19-12 2:15pm Avie Arenas, RNBSN [phone] Talked with family.  Mother, sister - who she lives with and older brother.  between someone in the family - state someone will be with her once she is discharaged.  CM will contine to follow for further needs.  Asked for assistance with health care POA.  SW consult placed.

## 2012-12-22 NOTE — Anesthesia Preprocedure Evaluation (Signed)
Anesthesia Evaluation  Patient identified by MRN, date of birth, ID band Patient awake    Reviewed: Allergy & Precautions, H&P , NPO status , Patient's Chart, lab work & pertinent test results  Airway       Dental   Pulmonary          Cardiovascular hypertension,     Neuro/Psych    GI/Hepatic hiatal hernia,   Endo/Other  diabetes, Type obesity  Renal/GU      Musculoskeletal   Abdominal   Peds  Hematology   Anesthesia Other Findings VDRF  Reproductive/Obstetrics                           Anesthesia Physical Anesthesia Plan  ASA: IV  Anesthesia Plan: General   Post-op Pain Management:    Induction:   Airway Management Planned: Oral ETT  Additional Equipment:   Intra-op Plan:   Post-operative Plan: Post-operative intubation/ventilation  Informed Consent: I have reviewed the patients History and Physical, chart, labs and discussed the procedure including the risks, benefits and alternatives for the proposed anesthesia with the patient or authorized representative who has indicated his/her understanding and acceptance.     Plan Discussed with: CRNA, Anesthesiologist and Surgeon  Anesthesia Plan Comments:         Anesthesia Quick Evaluation

## 2012-12-22 NOTE — Progress Notes (Signed)
PARENTERAL NUTRITION CONSULT NOTE - Follow-up  Pharmacy Consult for TPN Indication: Open abdomen s/p hernia repair, small bowel resection; unable to feed enterally  Allergies  Allergen Reactions  . Corn-Containing Products Nausea And Vomiting    Stomach inflamed  . Sulfa Antibiotics     Childhood reaction    Patient Measurements: Height: 5\' 7"  (170.2 cm) Weight: 313 lb 11.4 oz (142.3 kg) IBW/kg (Calculated) : 61.6  Vital Signs: Temp: 97.5 F (36.4 C) (02/10 0741) Temp src: Oral (02/10 0741) BP: 96/55 mmHg (02/10 0725) Pulse Rate: 83 (02/10 0725) Intake/Output from previous day: 02/09 0701 - 02/10 0700 In: 2543.5 [I.V.:1429.5; NG/GT:60; IV Piggyback:197; TPN:857] Out: 4825 [Urine:3675; Emesis/NG output:800; Drains:350] Intake/Output from this shift: Total I/O In: 50 [IV Piggyback:50] Out: 275 [Urine:75; Emesis/NG output:200]  Labs:  Recent Labs  12/20/12 0442 12/21/12 0400 12/22/12 0430  WBC 17.9* 18.2* 16.5*  HGB 9.0* 9.4* 9.9*  HCT 27.5* 29.4* 31.3*  PLT 295 318 357     Recent Labs  12/20/12 0442 12/21/12 0400 12/22/12 0430  NA 144 142 143  K 3.7 3.6 3.4*  CL 105 100 98  CO2 28 30 36*  GLUCOSE 113* 165* 187*  BUN 16 17 17   CREATININE 0.99 1.07 1.00  CALCIUM 9.3 9.5 9.8  MG  --  1.7 1.7  PHOS  --  4.7* 3.4  PROT 6.1 6.5 6.9  ALBUMIN 1.9* 1.9* 2.2*  AST 18 33 48*  ALT 12 17 26   ALKPHOS 77 87 105  BILITOT 0.2* 0.3 0.3  PREALBUMIN  --  11.8*  --   TRIG  --  180* 198*  CHOL  --  131 142   Estimated Creatinine Clearance: 103.1 ml/min (by C-G formula based on Cr of 1).    Recent Labs  12/21/12 1933 12/21/12 2342 12/22/12 0407  GLUCAP 195* 160* 165*   Insulin Requirements in the past 24 hours:  17 units Novolog using mod SSI   Current Nutrition:  Clinimix E5/15 at 179ml/hr until 2100 last pm then central line acces was lost; D10 started at 75ml/hr  Assessment: 63 yof admitted 2/3 with an incarcerated abdominal wall hernia and  perforated small bowel. Emergently went to the OR for ex-lap, LOA, perforation repair and bowel resection. Back to OR on 2/5 for exploration, unable to close fascia due to edema. TPN initiated 2/8 due to anticipated prolonged NPO status.Plan to go back to OR today for vac change and wound closure. Central line access was lost last night. Replaced early this morning, will resume today at goal rate.  GI: Open abdomen s/p perf repair, small bowel resection - unable to close d/t edema. Plan to return to the OR next week for possible closure. +853ml NG output, +354ml drain output, net -4.8L Endo: Hx DM (A1c 8.8) - CBGs 134-195 on SSI. Had been on an insulin gtt earlier in her admission (PTA metformin), will add regular insulin to TPN Lytes: Na 143, K 3.4 (replaced), other lytes wnl , lasix complete Renal: Hx CKD - Scr 1.0, UOP 1.1 Pulm: VDRF - intubated; 98% sat on 40% Fio2 Cards: Hx HTN - BP low 96/55 with nsr, HR 79 - no meds (PTA lotrel, triam/hctz) Hepatobil: LFTs + Tbili WNL. Trig 180, TC 131, CBC stable Neuro: Sedated on fent/versed gtt - Arousable, GCS 13, RASS -1 (goal 0) ID: Zosyn empiric for peritonitis. Completed micafungin today. Previously on vancomycin + primaxin. Afebrile, WBC dec to 16  Best Practices: heparin SQ, PPI IV, MC TPN Access:  CVC TPN day#: 2  Nutritional Goals:  2607 Kcal/day (Underfeeding goal: 1610-9604 kcal/day) 134-170gm/day >/= 1.5L/day  Plan:  1. Resume clinimix E5/15 at 129ml/hr which will provide 120 gm protein/day >>92% goal and an average of 1917 kcal/day>>105% underfeeding goal  2. Lipids + MVI + TE on MWF only d/t national shortage 3. F/u AM labs 4. F/u CBGs  5. Add 10 units regular insulin to tpn 6. Increase D10 to 128ml/hr until 1800 then d/c  Verlene Mayer, PharmD, BCPS Pager [phone] 12/22/2012,8:42 AM

## 2012-12-22 NOTE — Progress Notes (Signed)
FPTS Social Note  Erin Schultz is a 47 y.o. year old female presenting with PMH of HTN, and DM2, here with an incarcerated hernia leading to SBO and perforation s/p resection and adhesion lysis on 2/4 now intubated and weaned off pressors. She had septic ATN requiring CRRT which is now stopped due to improvement in renal function. She is intubated and surgery plans to take her back to the OR today to examen for the possibility of closure vs replacing wound vac.    She is currently ICU status and being actively managed by CCM- we appreciate their excellent care.  We will follow along and assume primary care when she becomes floor status.   Kevin Fenton, MD 9:40 AM, 12/22/2012

## 2012-12-22 NOTE — Anesthesia Postprocedure Evaluation (Signed)
  Anesthesia Post-op Note  Patient: Erin Schultz  Procedure(s) Performed: Procedure(s): ABDOMINAL VACUUM ASSISTED CLOSURE CHANGE (N/A)  Patient Location: SICU  Anesthesia Type:General  Level of Consciousness: sedated and unresponsive  Airway and Oxygen Therapy: Patient Spontanous Breathing  Post-op Pain: mild  Post-op Assessment: Post-op Vital signs reviewed, Patient's Cardiovascular Status Stable and Respiratory Function Stable  Post-op Vital Signs: stable  Complications: No apparent anesthesia complications

## 2012-12-22 NOTE — Transfer of Care (Signed)
Immediate Anesthesia Transfer of Care Note  Patient: Erin Schultz  Procedure(s) Performed: Procedure(s): ABDOMINAL VACUUM ASSISTED CLOSURE CHANGE (N/A)  Patient Location: SICU  Anesthesia Type:General  Level of Consciousness: sedated and unresponsive  Airway & Oxygen Therapy: Patient remains intubated per anesthesia plan and Patient placed on Ventilator (see vital sign flow sheet for setting)  Post-op Assessment: Report given to PACU RN and Post -op Vital signs reviewed and stable  Post vital signs: Reviewed and stable  Complications: No apparent anesthesia complications

## 2012-12-22 NOTE — Procedures (Signed)
Central Venous Catheter Insertion Procedure Note Erin Schultz [id number] [birth year]  Procedure: Insertion of Central Venous Catheter Indications: Assessment of intravascular volume, Drug and/or fluid administration and Frequent blood sampling  Procedure Details Consent: Risks of procedure as well as the alternatives and risks of each were explained to the (patient/caregiver).  Consent for procedure obtained. Time Out: Verified patient identification, verified procedure, site/side was marked, verified correct patient position, special equipment/implants available, medications/allergies/relevent history reviewed, required imaging and test results available.  Performed  Maximum sterile technique was used including antiseptics, cap, gloves, gown, hand hygiene, mask and sheet. Skin prep: Chlorhexidine; local anesthetic administered A antimicrobial bonded/coated triple lumen catheter was placed in the left internal jugular vein under direct visualization with ultrasound  using the Seldinger technique.  Evaluation Blood flow good Complications: No apparent complications Patient did tolerate procedure well. Chest X-ray ordered to verify placement.  CXR: normal.  Overton Mam, M.D. Pulmonary and Critical Care Medicine Call E-link with questions [phone] 12/22/2012, 3:13 AM

## 2012-12-22 NOTE — Progress Notes (Signed)
General Surgery Note  LOS: 7 days  POD -   3 Days Post-Op Room - 2314  Assessment/Plan: 1.  EXPLORATORY LAPAROTOMY, enterolysis, small bowel resection - R. Byerly - 12/16/2012  ABDOMINAL VACUUM ASSISTED CLOSURE CHANGE - H. Derrell Lolling - 12/17/12 and 12/19/12  Plan to take back to the OR again today to change the VAC.  Will try primary wound closure, depending on fascia and bowel  2.  VDRF 3.  Morbid obesity 4.  Severe malnutrition - on TPN  Central line fell out - and she if off TPN at this moment. 5.  On Zosyn 6.  DVT prophylaxis - on SQ Heparin   Subjective:  Intubated.  But alert, nods head, and seems to understand conversation.  Mother - Veleta Miners - [phone].  I spoke to her by phone. Objective:   Filed Vitals:   12/22/12 0741  BP:   Pulse:   Temp: 97.5 F (36.4 C)  Resp:      Intake/Output from previous day:  02/09 0701 - 02/10 0700 In: 2543.5 [I.V.:1429.5; NG/GT:60; IV Piggyback:197; TPN:857] Out: 4825 [Urine:3675; Emesis/NG output:800; Drains:350]  Intake/Output this shift:  Total I/O In: 50 [IV Piggyback:50] Out: 275 [Urine:75; Emesis/NG output:200]   Physical Exam:   General: Morbidly obese  AA F who is intubated.  But she is    HEENT: Normal. Pupils equal. .   Lungs: Clear   Abdomen: With "blue" VAC     Lab Results:    Recent Labs  12/21/12 0400 12/22/12 0430  WBC 18.2* 16.5*  HGB 9.4* 9.9*  HCT 29.4* 31.3*  PLT 318 357    BMET   Recent Labs  12/21/12 0400 12/22/12 0430  NA 142 143  K 3.6 3.4*  CL 100 98  CO2 30 36*  GLUCOSE 165* 187*  BUN 17 17  CREATININE 1.07 1.00  CALCIUM 9.5 9.8    PT/INR  No results found for this basename: LABPROT, INR,  in the last 72 hours  ABG   Recent Labs  12/19/12 1404 12/20/12 0610  PHART 7.408 7.419  HCO3 24.5* 29.0*     Studies/Results:  Dg Chest Port 1 View  12/22/2012  *RADIOLOGY REPORT*  Clinical Data: Central line placement.  PORTABLE CHEST - 1 VIEW  Comparison: Chest radiograph performed  12/21/2012  Findings: The patient's endotracheal tube is seen ending 4 cm above the carina.  The enteric tube is seen extending below the diaphragm.  A new left IJ line is noted ending about the distal SVC.  Mildly increasing bibasilar airspace opacification may reflect atelectasis or possibly pneumonia, more prominent than on the prior study.  Small bilateral pleural effusion may be present.  No pneumothorax is seen.  The cardiomediastinal silhouette is borderline normal in size.  No acute osseous abnormalities are identified.  IMPRESSION:  1.  Left IJ line noted ending about the distal SVC. 2.  Mildly increasing bibasilar airspace opacification may reflect atelectasis or possibly pneumonia, more prominent than on the prior study.  Small bilateral pleural effusions may be present.   Original Report Authenticated By: Tonia Ghent, M.D.    Dg Chest Port 1 View  12/21/2012  *RADIOLOGY REPORT*  Clinical Data: Follow up endotracheal tube placement  PORTABLE CHEST - 1 VIEW  Comparison: Prior chest x-ray 12/20/2012  Findings: Stable position of endotracheal tube 3 cm above the carina.  Right IJ central venous catheter also in unchanged position with the tip in the upper superior vena cava.  The appearance of the  lungs is stable.  Very low inspiratory volumes with mild central vascular congestion and bibasilar opacities likely reflecting atelectasis.  Superimposed consolidation is difficult to exclude.  IMPRESSION:  No significant interval change in the appearance of the chest.   Original Report Authenticated By: Malachy Moan, M.D.      Anti-infectives:   Anti-infectives   Start     Dose/Rate Route Frequency Ordered Stop   12/19/12 1000  piperacillin-tazobactam (ZOSYN) IVPB 3.375 g     3.375 g 12.5 mL/hr over 240 Minutes Intravenous 3 times per day 12/19/12 0940     12/18/12 1800  imipenem-cilastatin (PRIMAXIN) 500 mg in sodium chloride 0.9 % 100 mL IVPB  Status:  Discontinued     500 mg 200 mL/hr over  30 Minutes Intravenous 4 times per day 12/18/12 1402 12/19/12 0923   12/18/12 0900  vancomycin (VANCOCIN) 2,000 mg in sodium chloride 0.9 % 500 mL IVPB  Status:  Discontinued     2,000 mg 250 mL/hr over 120 Minutes Intravenous Every 48 hours 12/16/12 0927 12/17/12 1236   12/17/12 1400  vancomycin (VANCOCIN) 2,000 mg in sodium chloride 0.9 % 500 mL IVPB  Status:  Discontinued     2,000 mg 250 mL/hr over 120 Minutes Intravenous Every 24 hours 12/17/12 1236 12/18/12 1005   12/17/12 1000  micafungin (MYCAMINE) 100 mg in sodium chloride 0.9 % 100 mL IVPB     100 mg 100 mL/hr over 1 Hours Intravenous Daily 12/16/12 0908 12/20/12 1059   12/16/12 2300  ertapenem (INVANZ) 1 g in sodium chloride 0.9 % 50 mL IVPB  Status:  Discontinued     1 g 100 mL/hr over 30 Minutes Intravenous Every 24 hours 12/16/12 0230 12/16/12 0908   12/16/12 1200  imipenem-cilastatin (PRIMAXIN) 250 mg in sodium chloride 0.9 % 100 mL IVPB  Status:  Discontinued     250 mg 200 mL/hr over 30 Minutes Intravenous 4 times per day 12/16/12 0927 12/18/12 1402   12/16/12 1000  micafungin (MYCAMINE) 100 mg in sodium chloride 0.9 % 100 mL IVPB     100 mg 100 mL/hr over 1 Hours Intravenous Daily 12/16/12 0908 12/16/12 1239   12/16/12 0930  vancomycin (VANCOCIN) 2,500 mg in sodium chloride 0.9 % 500 mL IVPB     2,500 mg 250 mL/hr over 120 Minutes Intravenous  Once 12/16/12 0927 12/16/12 1334   12/16/12 0600  ertapenem (INVANZ) 1 g in sodium chloride 0.9 % 50 mL IVPB     1 g 100 mL/hr over 30 Minutes Intravenous On call to O.R. 12/15/12 2129 12/15/12 2254   12/15/12 2015  [MAR Hold]  piperacillin-tazobactam (ZOSYN) IVPB 3.375 g     (On MAR Hold since 12/15/12 2236)   3.375 g 12.5 mL/hr over 240 Minutes Intravenous  Once 12/15/12 2006 12/16/12 0034      Ovidio Kin, MD, FACS Pager: [phone],   [facility] Office: [phone] 12/22/2012

## 2012-12-22 NOTE — Progress Notes (Signed)
eLink Physician-Brief Progress Note Patient Name: Erin Schultz DOB: 14-Apr-1966 MRN: [id number]  Date of Service  12/22/2012   HPI/Events of Note   Hypokalemia  eICU Interventions  Potassium replaced   Intervention Category Minor Interventions: Electrolytes abnormality - evaluation and management  DETERDING,ELIZABETH 12/22/2012, 5:45 AM

## 2012-12-22 NOTE — Progress Notes (Signed)
NUTRITION FOLLOW UP  Intervention:    TPN per pharmacy RD to follow for nutrition care plan  Nutrition Dx:   Inadequate oral intake related to altered GI function as evidenced by NPO status, ongoing  Goal:   TPN to provide >/= 90% of estimated protein needs and 60-70% of estimated calorie needs (22-25 kcals/kg ideal body weight) as able with premixed solution based on ASPEN guidelines for permissive underfeeding in critically ill obese individuals, met  Monitor:   TPN prescription, respiratory status, weight, labs, I/O's  Assessment:   Patient s/p procedures 2/4:  EXPLORATORY LAPAROTOMY  LYSIS OF ADHESIONS  SMALL BOWEL RESECTION  PLACEMENT OF ABDOMINAL WOUND VAC  Patient remains intubated on ventilator support MV: 12.1 Temp: 36.4  Patient is receiving TPN with Clinimix E 5/15 @ 100 ml/hr.  Lipids (20% IVFE @ 10 ml/hr), multivitamins, and trace elements are provided 3 times weekly (MWF) due to national backorder.  Provides 1910 kcal and 120 grams protein daily (based on weekly average).  Meets 85% minimum estimated kcal and 96% minimum estimated protein needs.  Height: Ht Readings from Last 1 Encounters:  12/16/12 5\' 7"  (1.702 m)    Weight Status:   Wt Readings from Last 1 Encounters:  12/22/12 313 lb 11.4 oz (142.3 kg)    Re-estimated needs:  Kcal: 2250 Protein: 125-135 gm Fluid: 2.6-2.7 L  Skin: abdominal wound  Diet Order: NPO   Intake/Output Summary (Last 24 hours) at 12/22/12 1610 Last data filed at 12/22/12 0900  Gross per 24 hour  Intake 2463.5 ml  Output   4825 ml  Net -2361.5 ml    Labs:   Recent Labs Lab 12/17/12 0450  12/18/12 0340  12/20/12 0442 12/21/12 0400 12/22/12 0430  NA 138  < > 140  < > 144 142 143  K 3.7  < > 4.1  < > 3.7 3.6 3.4*  CL 102  < > 106  < > 105 100 98  CO2 24  < > 25  < > 28 30 36*  BUN 17  < > 13  < > 16 17 17   CREATININE 1.08  < > 0.96  < > 0.99 1.07 1.00  CALCIUM 8.0*  < > 8.2*  < > 9.3 9.5 9.8  MG 1.6  --    --   --   --  1.7 1.7  PHOS 2.4  --  2.7  --   --  4.7* 3.4  GLUCOSE 149*  < > 131*  < > 113* 165* 187*  < > = values in this interval not displayed.  CBG (last 3)   Recent Labs  12/21/12 2342 12/22/12 0407 12/22/12 0737  GLUCAP 160* 165* 152*    Scheduled Meds: . antiseptic oral rinse  15 mL Mouth Rinse QID  . chlorhexidine  15 mL Mouth Rinse BID  . heparin  5,000 Units Subcutaneous Q8H  . insulin aspart  0-15 Units Subcutaneous Q4H  . pantoprazole (PROTONIX) IV  40 mg Intravenous Daily  . piperacillin-tazobactam (ZOSYN)  IV  3.375 g Intravenous Q8H  . potassium chloride  10 mEq Intravenous Q1 Hr x 4  . sodium chloride  250 mL Intravenous Once    Continuous Infusions: . sodium chloride 20 mL/hr at 12/20/12 0000  . dextrose 75 mL/hr at 12/22/12 0700  . TPN (CLINIMIX) +/- additives     And  . fat emulsion    . fentaNYL infusion INTRAVENOUS 175 mcg/hr (12/22/12 0700)  . midazolam (VERSED) infusion 4  mg/hr (12/22/12 0700)    Maureen Chatters, RD, LDN Pager #: [phone] After-Hours Pager #: [phone]

## 2012-12-22 NOTE — Anesthesia Postprocedure Evaluation (Signed)
  Anesthesia Post-op Note  Patient: Erin Schultz  Procedure(s) Performed: Procedure(s): ABDOMINAL VACUUM ASSISTED CLOSURE CHANGE (N/A)  Patient Location: SICU  Anesthesia Type:General  Level of Consciousness: sedated and unresponsive  Airway and Oxygen Therapy: Patient remains intubated per anesthesia plan and Patient placed on Ventilator (see vital sign flow sheet for setting)  Post-op Pain: none  Post-op Assessment: Post-op Vital signs reviewed, Patient's Cardiovascular Status Stable, RESPIRATORY FUNCTION UNSTABLE, Patent Airway, No signs of Nausea or vomiting and Pain level controlled  Post-op Vital Signs: Reviewed and stable  Complications: No apparent anesthesia complications

## 2012-12-22 NOTE — Preoperative (Signed)
Beta Blockers   Reason not to administer Beta Blockers:Not Applicable 

## 2012-12-22 NOTE — Progress Notes (Signed)
PULMONARY  / CRITICAL CARE MEDICINE  Name: Erin Schultz MRN: [id number] DOB: May 23, 1966    ADMISSION DATE:  12/15/2012 CONSULTATION DATE:  12/16/12  REFERRING MD :  Donell Beers (CCS)  CHIEF COMPLAINT:  Abdominal pain  BRIEF PATIENT DESCRIPTION: 47 y/o morbidly obese female was admitted on 12/15/12 with an incarcerated abdominal wall hernia and perforated small bowel requiring emergent surgery.  PCCM consulted for post op vent management.  SIGNIFICANT EVENTS / STUDIES:  2/3 - CT abdomen>> large anterior abdominal wall hernia, perforation, and complex fluid collection deep to the bowel; small L kidney, likely poorly functioning R kidney given lack of contrast.  Fatty infiltration of liver 2/3 -  ex-lap, lysis of adhesions, perforation repair, 25cm of bowel resection 2/4 - ARF, limited urine output 2/5 - OR for reexploration - No evidence of infection, ischemia, or leak. Unable to close the fascia due to edema.  2/6 - neg balance, hd cath removed 2/8 - TNA started, net neg 3600 L 2/9 - stable, VAC remains in place  LINES / TUBES: 2/3 ETT >> 2/3 R IJ CVL >> 2/3 radial a-line >> 2/4 left ij HD>>>2/6  CULTURES: 2/4 blood >>neg 2/3 urine >>contam  ANTIBIOTICS: 2/4 Ertapenem >>2/4 2/4 Micafungin>>>2/8 2/4 vancomycin>>>2/6 2/4 Imipenem>>>2/7 2/7 zosyn>>>  SUBJECTIVE: 2/9 - no distress, awake / alert  VITAL SIGNS: Temp:  [97.5 F (36.4 C)-99.3 F (37.4 C)] 97.5 F (36.4 C) (02/10 0741) Pulse Rate:  [79-92] 80 (02/10 0900) Resp:  [10-24] 19 (02/10 0900) BP: (85-112)/(45-66) 101/54 mmHg (02/10 0800) SpO2:  [96 %-100 %] 97 % (02/10 0900) Arterial Line BP: (110-129)/(65-105) 110/105 mmHg (02/09 1500) FiO2 (%):  [40 %] 40 % (02/10 0725) Weight:  [142.3 kg (313 lb 11.4 oz)] 142.3 kg (313 lb 11.4 oz) (02/10 0600)  HEMODYNAMICS: CVP:  [10 mmHg-15 mmHg] 10 mmHg  VENTILATOR SETTINGS: Vent Mode:  [-] PRVC FiO2 (%):  [40 %] 40 % Set Rate:  [12 bmp] 12 bmp Vt Set:  [430 mL] 430 mL PEEP:   [8 cmH20] 8 cmH20 Plateau Pressure:  [19 cmH20-31 cmH20] 31 cmH20  INTAKE / OUTPUT: Intake/Output     02/09 0701 - 02/10 0700 02/10 0701 - 02/11 0700   I.V. (mL/kg) 1429.5 (10)    NG/GT 60    IV Piggyback 197 50   TPN 857    Total Intake(mL/kg) 2543.5 (17.9) 50 (0.4)   Urine (mL/kg/hr) 3675 (1.1) 500 (1.1)   Emesis/NG output 800 (0.2) 200 (0.4)   Drains 350 (0.1)    Total Output 4825 700   Net -2281.5 -650          PHYSICAL EXAMINATION:  Gen: Morbidly obese, rass 0 HEENT:  Obese, EET, line clean PULM: diminished in bases CV: tachy, regular, no mgr, no JVD AB: no bowel sounds, anterior wound with wound vac in place Ext: cool, pitting edema in legs improved Derm: wound open Neuro: sedated on vent, rass at goal, follows commands. No focal deficits   LABS:  Recent Labs Lab 12/20/12 0442 12/21/12 0400 12/22/12 0430  HGB 9.0* 9.4* 9.9*  HCT 27.5* 29.4* 31.3*  WBC 17.9* 18.2* 16.5*  PLT 295 318 357    Recent Labs Lab 12/16/12 1614 12/17/12 0450  12/18/12 0340 12/19/12 0400 12/19/12 1600 12/20/12 0442 12/21/12 0400 12/22/12 0430  NA 135 138  < > 140 142  --  144 142 143  K 3.8 3.7  < > 4.1 3.7  --  3.7 3.6 3.4*  CL 100 102  < >  106 107  --  105 100 98  CO2 21 24  < > 25 27  --  28 30 36*  GLUCOSE 225* 149*  < > 131* 129*  --  113* 165* 187*  BUN 25* 17  < > 13 16  --  16 17 17   CREATININE 1.95* 1.08  < > 0.96 0.97 0.95 0.99 1.07 1.00  CALCIUM 8.1* 8.0*  < > 8.2* 8.8  --  9.3 9.5 9.8  MG  --  1.6  --   --   --   --   --  1.7 1.7  PHOS 2.9 2.4  --  2.7  --   --   --  4.7* 3.4  < > = values in this interval not displayed.  Recent Labs Lab 12/18/12 0336 12/18/12 1239 12/19/12 0925 12/19/12 1404 12/20/12 0610  PHART 7.414 7.446 7.416 7.408 7.419  PCO2ART 40.5 41.0 39.5 39.7 45.5*  PO2ART 61.9* 61.0* 70.7* 85.8 100.0  HCO3 25.4* 28.3* 24.9* 24.5* 29.0*  TCO2 26.7 29 26.1 25.7 30.4  O2SAT 93.0 92.0 96.3 96.4 97.7      ASSESSMENT /  PLAN:  Principal Problem:   Septic shock(785.52) Active Problems:   Small bowel obstruction   Acute respiratory failure with hypoxia   Hernia, umbilical, with gangrene   Acute peritonitis   Bowel perforation   Severe sepsis(995.92)   Protein-calorie malnutrition, moderate   PULMONARY A: 1) Post op respiratory failure - Open abdomen, plan to return to OR for reexploration, closure of abdomen P:   - Will not extubate until abdomen closed - pcxr reviewed, continue neg balance - allow neg balance to improve O2 needs, lasix BID - would extubate from ps 8, cpap 8 if needed, given body habitus - will need outpt sleep study - follow up CXR 2/11  CARDIOVASCULAR A:  1) Shock, presumably septic - Pressors off with stable BP, EGDT completed.  RESOLVED.  P:  - Neg balance tolerated  RENAL A:   1) Oliguric renal failure- likely due to sepsis, improved urine output & SCr.  Renal function significantly improved 2/9.   P:   - F/u Chem - supp K with lasix - Monitor urine output  GASTROINTESTINAL A:   1) Abdominal hernia, bowel strangulation and perforation now s/p exlap, partial small bowel resection P:   - ppi - NPO - cont TPN  - await repeat OR trip for closure  HEMATOLOGIC A:   1) MIld anemia P:  - sub q hep - cbc in am   INFECTIOUS A:   1) Septic shock from peritonitis; CT abdomen with fluid collection worrisome for abscess, but apparently not seen in OR (not noted in op note)  P:   - remains culture neg - continue zosyn   ENDOCRINE A:  1) DM2 P:   - ICU hyperglycemia protocol   NEUROLOGIC A:   1) Post op pain control 2) Vent synchrony  P:   -fentanyl gtt -versed gtt, goal to dc this post op closure   I have personally obtained a history, examined the patient, evaluated laboratory and imaging results, formulated the assessment and plan and placed orders.  CRITICAL CARE: The patient is critically ill with multiple organ systems failure and requires  high complexity decision making for assessment and support, frequent evaluation and titration of therapies, application of advanced monitoring technologies and extensive interpretation of multiple databases. Critical Care Time devoted to patient care services described in this note is 30 minutes.  Levy Pupa, MD, PhD 12/22/2012, 10:15 AM [facility] [phone] or if no answer [phone]

## 2012-12-22 NOTE — Progress Notes (Signed)
ANTIBIOTIC CONSULT NOTE - FOLLOW UP  Pharmacy Consult for zosyn Indication: perforated bowel, peritonitis  Allergies  Allergen Reactions  . Corn-Containing Products Nausea And Vomiting    Stomach inflamed  . Sulfa Antibiotics     Childhood reaction    Patient Measurements: Height: 5\' 7"  (170.2 cm) Weight: 313 lb 11.4 oz (142.3 kg) IBW/kg (Calculated) : 61.6   Vital Signs: Temp: 97.2 F (36.2 C) (02/10 1253) Temp src: Oral (02/10 1253) BP: 117/72 mmHg (02/10 1200) Pulse Rate: 92 (02/10 1200) Intake/Output from previous day: 02/09 0701 - 02/10 0700 In: 2543.5 [I.V.:1429.5; NG/GT:60; IV Piggyback:197; TPN:857] Out: 4825 [Urine:3675; Emesis/NG output:800; Drains:350] Intake/Output from this shift: Total I/O In: 1549 [I.V.:1364; NG/GT:60; IV Piggyback:125] Out: 1475 [Urine:1275; Emesis/NG output:200]  Labs:  Recent Labs  12/20/12 0442 12/21/12 0400 12/22/12 0430  WBC 17.9* 18.2* 16.5*  HGB 9.0* 9.4* 9.9*  PLT 295 318 357  CREATININE 0.99 1.07 1.00   Estimated Creatinine Clearance: 103.1 ml/min (by C-G formula based on Cr of 1). No results found for this basename: VANCOTROUGH, Leodis Binet, VANCORANDOM, GENTTROUGH, GENTPEAK, GENTRANDOM, TOBRATROUGH, TOBRAPEAK, TOBRARND, AMIKACINPEAK, AMIKACINTROU, AMIKACIN,  in the last 72 hours   Microbiology: Recent Results (from the past 720 hour(s))  URINE CULTURE     Status: None   Collection Time    12/15/12  1:36 PM      Result Value Range Status   Specimen Description URINE, RANDOM   Final   Special Requests NONE   Final   Culture  Setup Time 12/15/2012 14:35   Final   Colony Count 50,000 COLONIES/ML   Final   Culture     Final   Value: Multiple bacterial morphotypes present, none predominant. Suggest appropriate recollection if clinically indicated.   Report Status 12/16/2012 FINAL   Final  MRSA PCR SCREENING     Status: None   Collection Time    12/16/12  2:51 AM      Result Value Range Status   MRSA by PCR NEGATIVE   NEGATIVE Final   Comment:            The GeneXpert MRSA Assay (FDA     approved for NASAL specimens     only), is one component of a     comprehensive MRSA colonization     surveillance program. It is not     intended to diagnose MRSA     infection nor to guide or     monitor treatment for     MRSA infections.  CULTURE, BLOOD (ROUTINE X 2)     Status: None   Collection Time    12/16/12  4:20 AM      Result Value Range Status   Specimen Description BLOOD RIGHT HAND   Final   Special Requests BOTTLES DRAWN AEROBIC ONLY 10CC   Final   Culture  Setup Time 12/16/2012 09:48   Final   Culture NO GROWTH 5 DAYS   Final   Report Status 12/22/2012 FINAL   Final  CULTURE, BLOOD (ROUTINE X 2)     Status: None   Collection Time    12/16/12  7:00 AM      Result Value Range Status   Specimen Description BLOOD THUMB LEFT   Final   Special Requests BOTTLES DRAWN AEROBIC ONLY 4.0CC   Final   Culture  Setup Time 12/16/2012 15:50   Final   Culture NO GROWTH 5 DAYS   Final   Report Status 12/22/2012 FINAL   Final  Anti-infectives   Start     Dose/Rate Route Frequency Ordered Stop   12/19/12 1000  piperacillin-tazobactam (ZOSYN) IVPB 3.375 g     3.375 g 12.5 mL/hr over 240 Minutes Intravenous 3 times per day 12/19/12 0940     12/18/12 1800  imipenem-cilastatin (PRIMAXIN) 500 mg in sodium chloride 0.9 % 100 mL IVPB  Status:  Discontinued     500 mg 200 mL/hr over 30 Minutes Intravenous 4 times per day 12/18/12 1402 12/19/12 0923   12/18/12 0900  vancomycin (VANCOCIN) 2,000 mg in sodium chloride 0.9 % 500 mL IVPB  Status:  Discontinued     2,000 mg 250 mL/hr over 120 Minutes Intravenous Every 48 hours 12/16/12 0927 12/17/12 1236   12/17/12 1400  vancomycin (VANCOCIN) 2,000 mg in sodium chloride 0.9 % 500 mL IVPB  Status:  Discontinued     2,000 mg 250 mL/hr over 120 Minutes Intravenous Every 24 hours 12/17/12 1236 12/18/12 1005   12/17/12 1000  micafungin (MYCAMINE) 100 mg in sodium chloride  0.9 % 100 mL IVPB     100 mg 100 mL/hr over 1 Hours Intravenous Daily 12/16/12 0908 12/20/12 1059   12/16/12 2300  ertapenem (INVANZ) 1 g in sodium chloride 0.9 % 50 mL IVPB  Status:  Discontinued     1 g 100 mL/hr over 30 Minutes Intravenous Every 24 hours 12/16/12 0230 12/16/12 0908   12/16/12 1200  imipenem-cilastatin (PRIMAXIN) 250 mg in sodium chloride 0.9 % 100 mL IVPB  Status:  Discontinued     250 mg 200 mL/hr over 30 Minutes Intravenous 4 times per day 12/16/12 0927 12/18/12 1402   12/16/12 1000  micafungin (MYCAMINE) 100 mg in sodium chloride 0.9 % 100 mL IVPB     100 mg 100 mL/hr over 1 Hours Intravenous Daily 12/16/12 0908 12/16/12 1239   12/16/12 0930  vancomycin (VANCOCIN) 2,500 mg in sodium chloride 0.9 % 500 mL IVPB     2,500 mg 250 mL/hr over 120 Minutes Intravenous  Once 12/16/12 0927 12/16/12 1334   12/16/12 0600  ertapenem (INVANZ) 1 g in sodium chloride 0.9 % 50 mL IVPB     1 g 100 mL/hr over 30 Minutes Intravenous On call to O.R. 12/15/12 2129 12/15/12 2254   12/15/12 2015  [MAR Hold]  piperacillin-tazobactam (ZOSYN) IVPB 3.375 g     (On MAR Hold since 12/15/12 2236)   3.375 g 12.5 mL/hr over 240 Minutes Intravenous  Once 12/15/12 2006 12/16/12 0034      Assessment: Patient is a 57 y. F on zosyn for perforated bowel and peritonitis.  S/p abdominal VAC changed today.  All cultures have been negative.  Patient remains afebrile with wbc trending down.  Scr is stable with good urine output.  Plan:  1) continue current zozyn regimen  Nimai Burbach P 12/22/2012,1:18 PM

## 2012-12-23 ENCOUNTER — Inpatient Hospital Stay (HOSPITAL_COMMUNITY): Payer: BC Managed Care – PPO

## 2012-12-23 ENCOUNTER — Encounter (HOSPITAL_COMMUNITY): Payer: Self-pay | Admitting: General Surgery

## 2012-12-23 LAB — GLUCOSE, CAPILLARY
Glucose-Capillary: 245 mg/dL — ABNORMAL HIGH (ref 70–99)
Glucose-Capillary: 257 mg/dL — ABNORMAL HIGH (ref 70–99)
Glucose-Capillary: 251 mg/dL — ABNORMAL HIGH (ref 70–99)

## 2012-12-23 LAB — BASIC METABOLIC PANEL
BUN: 18 mg/dL (ref 6–23)
CO2: 32 mEq/L (ref 19–32)
Calcium: 8.9 mg/dL (ref 8.4–10.5)
Chloride: 98 mEq/L (ref 96–112)
Creatinine, Ser: 1.03 mg/dL (ref 0.50–1.10)
Glucose, Bld: 270 mg/dL — ABNORMAL HIGH (ref 70–99)

## 2012-12-23 LAB — PHOSPHORUS: Phosphorus: 3.5 mg/dL (ref 2.3–4.6)

## 2012-12-23 MED ORDER — INSULIN REGULAR HUMAN 100 UNIT/ML IJ SOLN
INTRAMUSCULAR | Status: AC
  Administered 2012-12-23: 17:00:00 via INTRAVENOUS
  Filled 2012-12-23: qty 2400

## 2012-12-23 MED ORDER — INSULIN GLARGINE 100 UNIT/ML ~~LOC~~ SOLN
10.0000 [IU] | Freq: Every day | SUBCUTANEOUS | Status: DC
  Administered 2012-12-23: 10 [IU] via SUBCUTANEOUS

## 2012-12-23 MED ORDER — FUROSEMIDE 10 MG/ML IJ SOLN
40.0000 mg | Freq: Two times a day (BID) | INTRAMUSCULAR | Status: AC
  Administered 2012-12-23 – 2012-12-24 (×3): 40 mg via INTRAVENOUS
  Filled 2012-12-23 (×3): qty 4

## 2012-12-23 NOTE — Progress Notes (Signed)
PULMONARY  / CRITICAL CARE MEDICINE  Name: Warda Mcqueary MRN: [id number] DOB: 09-Aug-1966    ADMISSION DATE:  12/15/2012 CONSULTATION DATE:  12/16/12  REFERRING MD :  Donell Beers (CCS)  CHIEF COMPLAINT:  Abdominal pain  BRIEF PATIENT DESCRIPTION: 47 y/o morbidly obese female was admitted on 12/15/12 with an incarcerated abdominal wall hernia and perforated small bowel requiring emergent surgery.  PCCM consulted for post op vent management.  SIGNIFICANT EVENTS / STUDIES:  2/3 - CT abdomen>> large anterior abdominal wall hernia, perforation, and complex fluid collection deep to the bowel; small L kidney, likely poorly functioning R kidney given lack of contrast.  Fatty infiltration of liver 2/3 -  ex-lap, lysis of adhesions, perforation repair, 25cm of bowel resection 2/4 - ARF, limited urine output 2/5 - OR for reexploration - No evidence of infection, ischemia, or leak. Unable to close the fascia due to edema.  2/6 - neg balance, hd cath removed 2/8 - TNA started, net neg 3600 L 2/9 - stable, VAC remains in place  LINES / TUBES: 2/3 ETT >> 2/3 R IJ CVL >> 2/3 radial a-line >> 2/4 left ij HD>>>2/6  CULTURES: 2/4 blood >>neg 2/3 urine >>contam  ANTIBIOTICS: 2/4 Ertapenem >>2/4 2/4 Micafungin>>>2/8 2/4 vancomycin>>>2/6 2/4 Imipenem>>>2/7 2/7 zosyn>>>  SUBJECTIVE: Went back to OR 2/10 but unable to fully close, planning back again on 2/12  VITAL SIGNS: Temp:  [97.2 F (36.2 C)-99.8 F (37.7 C)] 98.7 F (37.1 C) (02/11 0729) Pulse Rate:  [86-101] 95 (02/11 0950) Resp:  [19-35] 31 (02/11 0950) BP: (96-122)/(50-92) 104/65 mmHg (02/11 0950) SpO2:  [92 %-97 %] 96 % (02/11 0950) FiO2 (%):  [40 %] 40 % (02/11 0950) Weight:  [141.3 kg (311 lb 8.2 oz)] 141.3 kg (311 lb 8.2 oz) (02/11 0400)  HEMODYNAMICS: CVP:  [7 mmHg-12 mmHg] 12 mmHg  VENTILATOR SETTINGS: Vent Mode:  [-] CPAP FiO2 (%):  [40 %] 40 % Set Rate:  [12 bmp] 12 bmp Vt Set:  [430 mL] 430 mL PEEP:  [8 cmH20] 8  cmH20 Pressure Support:  [8 cmH20] 8 cmH20 Plateau Pressure:  [22 cmH20-29 cmH20] 26 cmH20  INTAKE / OUTPUT: Intake/Output     02/10 0701 - 02/11 0700 02/11 0701 - 02/12 0700   I.V. (mL/kg) 2590 (18.3) 93 (0.7)   NG/GT 180 30   IV Piggyback 275    TPN 3425.8 220   Total Intake(mL/kg) 6470.9 (45.8) 343 (2.4)   Urine (mL/kg/hr) 2080 (0.6) 120 (0.3)   Emesis/NG output 500 (0.1) 150 (0.4)   Drains 400 (0.1) 100 (0.2)   Total Output 2980 370   Net +3490.9 -27          PHYSICAL EXAMINATION:  Gen: Morbidly obese, rass 0 HEENT:  Obese, EET, line clean PULM: diminished in bases CV: tachy, regular, no mgr, no JVD AB: no bowel sounds, anterior wound with wound vac in place Ext: cool, pitting edema in legs improved Derm: wound open Neuro: sedated on vent, rass at goal, follows commands. No focal deficits   LABS:  Recent Labs Lab 12/20/12 0442 12/21/12 0400 12/22/12 0430  HGB 9.0* 9.4* 9.9*  HCT 27.5* 29.4* 31.3*  WBC 17.9* 18.2* 16.5*  PLT 295 318 357    Recent Labs Lab 12/16/12 1614 12/17/12 0450  12/18/12 0340 12/19/12 0400 12/19/12 1600 12/20/12 0442 12/21/12 0400 12/22/12 0430 12/23/12 0400  NA 135 138  < > 140 142  --  144 142 143 139  K 3.8 3.7  < > 4.1 3.7  --  3.7 3.6 3.4* 3.5  CL 100 102  < > 106 107  --  105 100 98 98  CO2 21 24  < > 25 27  --  28 30 36* 32  GLUCOSE 225* 149*  < > 131* 129*  --  113* 165* 187* 270*  BUN 25* 17  < > 13 16  --  16 17 17 18   CREATININE 1.95* 1.08  < > 0.96 0.97 0.95 0.99 1.07 1.00 1.03  CALCIUM 8.1* 8.0*  < > 8.2* 8.8  --  9.3 9.5 9.8 8.9  MG  --  1.6  --   --   --   --   --  1.7 1.7 1.6  PHOS 2.9 2.4  --  2.7  --   --   --  4.7* 3.4 3.5  < > = values in this interval not displayed.  Recent Labs Lab 12/18/12 0336 12/18/12 1239 12/19/12 0925 12/19/12 1404 12/20/12 0610  PHART 7.414 7.446 7.416 7.408 7.419  PCO2ART 40.5 41.0 39.5 39.7 45.5*  PO2ART 61.9* 61.0* 70.7* 85.8 100.0  HCO3 25.4* 28.3* 24.9* 24.5* 29.0*   TCO2 26.7 29 26.1 25.7 30.4  O2SAT 93.0 92.0 96.3 96.4 97.7      ASSESSMENT / PLAN:  Principal Problem:   Septic shock(785.52) Active Problems:   Small bowel obstruction   Acute respiratory failure with hypoxia   Hernia, umbilical, with gangrene   Acute peritonitis   Bowel perforation   Severe sepsis(995.92)   Protein-calorie malnutrition, moderate   PULMONARY A: 1) Post op respiratory failure - Open abdomen, plan to return to OR for reexploration, closure of abdomen P:   - Will not extubate until abdomen closed - pcxr reviewed, continue neg balance - allow neg balance to improve O2 needs, lasix BID - would extubate from ps 8, cpap 8 if needed, given body habitus - will need outpt sleep study - follow CXR  CARDIOVASCULAR A:  1) Shock, presumably septic - Pressors off with stable BP, EGDT completed.  RESOLVED.  P:  - Neg balance tolerated  RENAL A:   1) Oliguric renal failure- likely due to sepsis, improved urine output & SCr.  Renal function significantly improved 2/9.   P:   - F/u Chem - goal I=O >> restart lasix 2/11 - Monitor urine output  GASTROINTESTINAL A:   1) Abdominal hernia, bowel strangulation and perforation now s/p exlap, partial small bowel resection P:   - ppi - NPO - cont TPN  - await repeat OR trip for closure  HEMATOLOGIC A:   1) MIld anemia P:  - sub q hep - cbc in am   INFECTIOUS A:   1) Septic shock from peritonitis; CT abdomen with fluid collection worrisome for abscess, but apparently not seen in OR (not noted in op note)  P:   - remains culture neg - continue zosyn   ENDOCRINE A:  1) DM2 P:   - ICU hyperglycemia protocol   NEUROLOGIC A:   1) Post op pain control 2) Vent synchrony  P:   -fentanyl gtt -versed gtt, goal to dc this post op closure   I have personally obtained a history, examined the patient, evaluated laboratory and imaging results, formulated the assessment and plan and placed  orders.  CRITICAL CARE: The patient is critically ill with multiple organ systems failure and requires high complexity decision making for assessment and support, frequent evaluation and titration of therapies, application of advanced monitoring technologies and extensive interpretation  of multiple databases. Critical Care Time devoted to patient care services described in this note is 30 minutes.   Levy Pupa, MD, PhD 12/23/2012, 9:56 AM [facility] [phone] or if no answer [phone]

## 2012-12-23 NOTE — Progress Notes (Signed)
PARENTERAL NUTRITION CONSULT NOTE - Follow-up  Pharmacy Consult for TPN Indication: Open abdomen s/p hernia repair, small bowel resection; unable to feed enterally  Allergies  Allergen Reactions  . Corn-Containing Products Nausea And Vomiting    Stomach inflamed  . Sulfa Antibiotics     Childhood reaction    Patient Measurements: Height: 5\' 7"  (170.2 cm) Weight: 311 lb 8.2 oz (141.3 kg) IBW/kg (Calculated) : 61.6  Vital Signs: Temp: 98.7 F (37.1 C) (02/11 0729) Temp src: Oral (02/11 0729) BP: 121/70 mmHg (02/11 0800) Pulse Rate: 101 (02/11 0800) Intake/Output from previous day: 02/10 0701 - 02/11 0700 In: 6470.9 [I.V.:2590; NG/GT:180; IV Piggyback:275; TPN:3425.8] Out: 2980 [Urine:2080; Emesis/NG output:500; Drains:400] Intake/Output from this shift: Total I/O In: 186.5 [I.V.:46.5; NG/GT:30; TPN:110] Out: 75 [Urine:75]  Labs:  Recent Labs  12/21/12 0400 12/22/12 0430  WBC 18.2* 16.5*  HGB 9.4* 9.9*  HCT 29.4* 31.3*  PLT 318 357     Recent Labs  12/21/12 0400 12/22/12 0430 12/23/12 0400  NA 142 143 139  K 3.6 3.4* 3.5  CL 100 98 98  CO2 30 36* 32  GLUCOSE 165* 187* 270*  BUN 17 17 18   CREATININE 1.07 1.00 1.03  CALCIUM 9.5 9.8 8.9  MG 1.7 1.7 1.6  PHOS 4.7* 3.4 3.5  PROT 6.5 6.9  --   ALBUMIN 1.9* 2.2*  --   AST 33 48*  --   ALT 17 26  --   ALKPHOS 87 105  --   BILITOT 0.3 0.3  --   PREALBUMIN 11.8* 13.3*  --   TRIG 180* 198*  --   CHOL 131 142  --    Estimated Creatinine Clearance: 99.7 ml/min (by C-G formula based on Cr of 1.03).    Recent Labs  12/22/12 2341 12/23/12 0412 12/23/12 0728  GLUCAP 229* 255* 245*   Insulin Requirements in the past 24 hours:  18 units Novolog using mod SSI since last bag TPN  Nutrition Needs: (new per note 2/10) Kcals: 2250 Protein 125-135 g  Current Nutrition:  Clinimix E5/15 at 16ml/hr  Assessment: 68 yof admitted 2/3 with an incarcerated abdominal wall hernia and perforated small bowel.  Emergently went to the OR for ex-lap, LOA, perforation repair and bowel resection. Back to OR on 2/5 for exploration, unable to close fascia due to edema. TPN initiated 2/8 due to anticipated prolonged NPO status.  GI: Open abdomen s/p perf repair, small bowel resection. 2/10: Partial closure abdominal fascia with a VAC change. Plan OR 2/12 for further closure of abdomen. NG 500, Drains 400 out. Endo: Hx DM (A1c 8.8) - CBGs [phone] since last bag TPN started. Increase insulin in TPN Lytes: K 3.5, Mg 1.6 low normal. Renal: Hx CKD. S/p septic TPN on CVVHD now stopped.- Scr 1.03, UOP 2050cc (0.6) Pulm: VDRF - intubated; 95% sat on 40% Fio2 Cards: Hx HTN - Septic shock with BP 121/71, HR 101 (PTA lotrel, triam/hctz) Hepatobil: Tbili WNL. AST slightly elevated 48. Trig 180 to 198, TC 131 to 142 Neuro: Sedated on fent/versed gtt - Arousable ID: Zosyn empiric for peritonitis. Completed micafungin. Afebrile, WBC 16.5 Best Practices: heparin SQ, PPI IV, MC TPN Access: CVC TPN day#: 3   Plan:  1. Clinimix E5/15 at 17ml/hr which will provide 120 gm protein/day and an average of 1917 kcal/day (85% kcal goals and 96% protein goals) based on ASPEN guidelines for permissive underfeeding in critically ill obese individuals 2. Lipids + MVI + TE on MWF only d/t national  shortage 3. Increase insulin in TPN   Chancelor Hardrick S. Merilynn Finland, PharmD, [facility] Clinical Staff Pharmacist Pager [phone]   12/23/2012,9:05 AM

## 2012-12-23 NOTE — Progress Notes (Addendum)
FPTS Social Note  Erin Schultz is a 47 y.o. year old female presenting with PMH of HTN, and DM2, here with an incarcerated hernia leading to SBO and perforation s/p resection and adhesion lysis on 2/4 now intubated and weaned off pressors. She had septic ATN requiring CRRT which is now stopped due to improvement in renal function. She is on CPAP currently and surgery replaced her wound vac on 12/22/2012.    She is currently ICU status and being actively managed by CCM- we appreciate their excellent care.  We will follow along and assume primary care when she becomes floor status.   Kevin Fenton, MD 9:54 AM, 12/23/2012

## 2012-12-23 NOTE — Progress Notes (Signed)
Inpatient Diabetes Program Recommendations  AACE/ADA: New Consensus Statement on Inpatient Glycemic Control (2013)  Target Ranges:  Prepandial:   less than 140 mg/dL      Peak postprandial:   less than 180 mg/dL (1-2 hours)      Critically ill patients:  140 - 180 mg/dL   Reason for Visit:Results for CALISA, LUCKENBAUGH (MRN [id number]) as of 12/23/2012 08:53  Ref. Range 12/22/2012 19:37 12/22/2012 23:41 12/23/2012 04:12 12/23/2012 07:28  Glucose-Capillary Latest Range: 70-99 mg/dL 409 (H) 811 (H) 914 (H) 245 (H)     CBG's greater than 200 mg/dL.  Consider IV insulin/Phase 2 of ICU glycemic control protocol.  Will follow.

## 2012-12-23 NOTE — Progress Notes (Signed)
1 Day Post-Op  Subjective: Awake and alert on the vent comfortable  Objective: Vital signs in last 24 hours: Temp:  [97.2 F (36.2 C)-99.8 F (37.7 C)] 98.7 F (37.1 C) (02/11 0729) Pulse Rate:  [80-100] 96 (02/11 0700) Resp:  [19-30] 28 (02/11 0700) BP: (92-122)/(50-92) 112/63 mmHg (02/11 0700) SpO2:  [92 %-97 %] 95 % (02/11 0700) FiO2 (%):  [40 %] 40 % (02/11 0700) Weight:  [311 lb 8.2 oz (141.3 kg)] 311 lb 8.2 oz (141.3 kg) (02/11 0400)    Intake/Output from previous day: 02/10 0701 - 02/11 0700 In: 6450.9 [I.V.:2570; NG/GT:180; IV Piggyback:275; TPN:3425.8] Out: 2980 [Urine:2080; Emesis/NG output:500; Drains:400] Intake/Output this shift:    Abdomen obese but soft with VAC in place Minimally tender  Lab Results:   Recent Labs  12/21/12 0400 12/22/12 0430  WBC 18.2* 16.5*  HGB 9.4* 9.9*  HCT 29.4* 31.3*  PLT 318 357   BMET  Recent Labs  12/22/12 0430 12/23/12 0400  NA 143 139  K 3.4* 3.5  CL 98 98  CO2 36* 32  GLUCOSE 187* 270*  BUN 17 18  CREATININE 1.00 1.03  CALCIUM 9.8 8.9   PT/INR No results found for this basename: LABPROT, INR,  in the last 72 hours ABG No results found for this basename: PHART, PCO2, PO2, HCO3,  in the last 72 hours  Studies/Results: Dg Chest Port 1 View  12/22/2012  *RADIOLOGY REPORT*  Clinical Data: Central line placement.  PORTABLE CHEST - 1 VIEW  Comparison: Chest radiograph performed 12/21/2012  Findings: The patient's endotracheal tube is seen ending 4 cm above the carina.  The enteric tube is seen extending below the diaphragm.  A new left IJ line is noted ending about the distal SVC.  Mildly increasing bibasilar airspace opacification may reflect atelectasis or possibly pneumonia, more prominent than on the prior study.  Small bilateral pleural effusion may be present.  No pneumothorax is seen.  The cardiomediastinal silhouette is borderline normal in size.  No acute osseous abnormalities are identified.  IMPRESSION:   1.  Left IJ line noted ending about the distal SVC. 2.  Mildly increasing bibasilar airspace opacification may reflect atelectasis or possibly pneumonia, more prominent than on the prior study.  Small bilateral pleural effusions may be present.   Original Report Authenticated By: Tonia Ghent, M.D.    Dg Chest Port 1 View  12/21/2012  *RADIOLOGY REPORT*  Clinical Data: Follow up endotracheal tube placement  PORTABLE CHEST - 1 VIEW  Comparison: Prior chest x-ray 12/20/2012  Findings: Stable position of endotracheal tube 3 cm above the carina.  Right IJ central venous catheter also in unchanged position with the tip in the upper superior vena cava.  The appearance of the lungs is stable.  Very low inspiratory volumes with mild central vascular congestion and bibasilar opacities likely reflecting atelectasis.  Superimposed consolidation is difficult to exclude.  IMPRESSION:  No significant interval change in the appearance of the chest.   Original Report Authenticated By: Malachy Moan, M.D.     Anti-infectives: Anti-infectives   Start     Dose/Rate Route Frequency Ordered Stop   12/19/12 1000  piperacillin-tazobactam (ZOSYN) IVPB 3.375 g     3.375 g 12.5 mL/hr over 240 Minutes Intravenous 3 times per day 12/19/12 0940     12/18/12 1800  imipenem-cilastatin (PRIMAXIN) 500 mg in sodium chloride 0.9 % 100 mL IVPB  Status:  Discontinued     500 mg 200 mL/hr over 30 Minutes Intravenous 4  times per day 12/18/12 1402 12/19/12 0923   12/18/12 0900  vancomycin (VANCOCIN) 2,000 mg in sodium chloride 0.9 % 500 mL IVPB  Status:  Discontinued     2,000 mg 250 mL/hr over 120 Minutes Intravenous Every 48 hours 12/16/12 0927 12/17/12 1236   12/17/12 1400  vancomycin (VANCOCIN) 2,000 mg in sodium chloride 0.9 % 500 mL IVPB  Status:  Discontinued     2,000 mg 250 mL/hr over 120 Minutes Intravenous Every 24 hours 12/17/12 1236 12/18/12 1005   12/17/12 1000  micafungin (MYCAMINE) 100 mg in sodium chloride 0.9 %  100 mL IVPB     100 mg 100 mL/hr over 1 Hours Intravenous Daily 12/16/12 0908 12/20/12 1059   12/16/12 2300  ertapenem (INVANZ) 1 g in sodium chloride 0.9 % 50 mL IVPB  Status:  Discontinued     1 g 100 mL/hr over 30 Minutes Intravenous Every 24 hours 12/16/12 0230 12/16/12 0908   12/16/12 1200  imipenem-cilastatin (PRIMAXIN) 250 mg in sodium chloride 0.9 % 100 mL IVPB  Status:  Discontinued     250 mg 200 mL/hr over 30 Minutes Intravenous 4 times per day 12/16/12 0927 12/18/12 1402   12/16/12 1000  micafungin (MYCAMINE) 100 mg in sodium chloride 0.9 % 100 mL IVPB     100 mg 100 mL/hr over 1 Hours Intravenous Daily 12/16/12 0908 12/16/12 1239   12/16/12 0930  vancomycin (VANCOCIN) 2,500 mg in sodium chloride 0.9 % 500 mL IVPB     2,500 mg 250 mL/hr over 120 Minutes Intravenous  Once 12/16/12 0927 12/16/12 1334   12/16/12 0600  ertapenem (INVANZ) 1 g in sodium chloride 0.9 % 50 mL IVPB     1 g 100 mL/hr over 30 Minutes Intravenous On call to O.R. 12/15/12 2129 12/15/12 2254   12/15/12 2015  [MAR Hold]  piperacillin-tazobactam (ZOSYN) IVPB 3.375 g     (On MAR Hold since 12/15/12 2236)   3.375 g 12.5 mL/hr over 240 Minutes Intravenous  Once 12/15/12 2006 12/16/12 0034      Assessment/Plan: s/p Procedure(s): ABDOMINAL VACUUM ASSISTED CLOSURE CHANGE (N/A)   LOS: 8 days   Continuing current critical care management and support Dr. Ezzard Standing planning on return to OR tomorrow for further closure of abdomen   Danylle Ouk A 12/23/2012

## 2012-12-23 NOTE — Progress Notes (Signed)
Clinical Social Work Department BRIEF PSYCHOSOCIAL ASSESSMENT 12/23/2012  Patient:  Erin Schultz, Erin Schultz     Account Number:  [id number]     Admit date:  12/15/2012  Clinical Social Worker:  Madaline Guthrie  Date/Time:  12/23/2012 02:20 PM  Referred by:  Care Management  Date Referred:  12/23/2012 Referred for  Advanced Directives   Other Referral:   Interview type:  Family Other interview type:    PSYCHOSOCIAL DATA Living Status:  FAMILY Admitted from facility:   Level of care:   Primary support name:  Everardo Beals - sister - [phone] Primary support relationship to patient:  FAMILY Degree of support available:   good    CURRENT CONCERNS Current Concerns  None Noted   Other Concerns:    SOCIAL WORK ASSESSMENT / PLAN CSW met with pt and sister.  Pt's sister stated she has contacted pt's place of employment, [employer], to complete FMLA paperwork for pt and was told she would need HCPOA first.  CSW assisted pt and sister in completing HCPOA form.  Sister stated pt lives with her and she and their mother are her primary support.  Sister stated she did not have any additional needs at this time.   Assessment/plan status:  No Further Intervention Required Other assessment/ plan:   Information/referral to community resources:    PATIENT'S/FAMILY'S RESPONSE TO PLAN OF CARE: Sister was appreciative of CSW assistance.

## 2012-12-23 NOTE — Op Note (Signed)
Erin Schultz, Erin Schultz NO.:  [id number]  MEDICAL RECORD NO.:  [id number]  LOCATION:  2314                         FACILITY:  [facility]  PHYSICIAN:  Sandria Bales. Ezzard Standing, M.D.  DATE OF BIRTH:  August 19, 1966  DATE OF PROCEDURE:  12/22/2012                              OPERATIVE REPORT   PREOPERATIVE DIAGNOSIS:  Open abdominal wound.  POSTOPERATIVE DIAGNOSIS:  Open abdominal wound.  PROCEDURE:  Partial closure abdominal fascia with a VAC change.  SURGEON:  Sandria Bales. Ezzard Standing, M.D.  FIRST ASSISTANT:  None.  ANESTHESIA:  General endotracheal.  ESTIMATED BLOOD LOSS:  Minimal.  INDICATION FOR PROCEDURE:  [name] is a 47 year old AfricanAmerican female who presented with a small bowel obstruction requiring a small bowel resection enterolysis by Dr. Almond Lint on December 16, 2012.  She already had a pre-existing hernia, had enough edema the fascia could not be closed.  She was taken back to the operating room by Dr. Derrell Lolling for [facility] change on December 17, 2012 and December 19, 2012.  She now comes for another VAC change.   I spoke to her on her mother, Shoshanah Dapper, about the plan and procedure.  OPERATIVE NOTE:  The patient was taken to room #1 at [facility], she was already intubated, transferred from the 2300 unit.  Her VAC was removed and her abdomen prepped.  A time-out was held and surgical checklist run.  She had eviscerated small bowel but I was able to free up the fascial edges and reduce the bowel back into the abdominal cavity.  This left a defect in the fascia 15 x 20 cm, 20 cm in length, cranial caudad.  I used #1 Novafil sutures and closed this defect down to 13 cm in length by 10 cm in width.  I then placed a new VAC dressing on the wound.  The patient was then transferred back to the ICU in good condition.  PLAN:  My plan is to bring her back in 2 days for another attempt of wound closure and VAC change.  The entire procedure was explained to the  family.   Sandria Bales. Ezzard Standing, M.D., FACS   DHN/MEDQ  D:  12/22/2012  T:  12/22/2012  Job:  [id number]  cc:   Charlcie Cradle. Delford Field, MD, FCCP

## 2012-12-23 NOTE — Progress Notes (Signed)
Glycemic control   Added Lantus to her SSI;

## 2012-12-24 ENCOUNTER — Encounter (HOSPITAL_COMMUNITY): Payer: Self-pay | Admitting: Certified Registered"

## 2012-12-24 ENCOUNTER — Encounter (HOSPITAL_COMMUNITY)
Admission: EM | Disposition: A | Discharge status: Home Health | Payer: Self-pay | Source: Home / Self Care | Attending: Pulmonary Disease

## 2012-12-24 ENCOUNTER — Inpatient Hospital Stay (HOSPITAL_COMMUNITY): Payer: BC Managed Care – PPO | Admitting: Certified Registered"

## 2012-12-24 DIAGNOSIS — S31109A Unspecified open wound of abdominal wall, unspecified quadrant without penetration into peritoneal cavity, initial encounter: Secondary | ICD-10-CM

## 2012-12-24 DIAGNOSIS — T8131XA Disruption of external operation (surgical) wound, not elsewhere classified, initial encounter: Secondary | ICD-10-CM

## 2012-12-24 HISTORY — PX: VACUUM ASSISTED CLOSURE CHANGE: SHX5227

## 2012-12-24 LAB — BASIC METABOLIC PANEL
BUN: 27 mg/dL — ABNORMAL HIGH (ref 6–23)
CO2: 31 mEq/L (ref 19–32)
Chloride: 99 mEq/L (ref 96–112)
GFR calc Af Amer: 67 mL/min — ABNORMAL LOW (ref >90)
Potassium: 3.4 mEq/L — ABNORMAL LOW (ref 3.5–5.1)

## 2012-12-24 LAB — GLUCOSE, CAPILLARY
Glucose-Capillary: 124 mg/dL — ABNORMAL HIGH (ref 70–99)
Glucose-Capillary: 128 mg/dL — ABNORMAL HIGH (ref 70–99)
Glucose-Capillary: 138 mg/dL — ABNORMAL HIGH (ref 70–99)
Glucose-Capillary: 170 mg/dL — ABNORMAL HIGH (ref 70–99)
Glucose-Capillary: 211 mg/dL — ABNORMAL HIGH (ref 70–99)
Glucose-Capillary: 221 mg/dL — ABNORMAL HIGH (ref 70–99)
Glucose-Capillary: 225 mg/dL — ABNORMAL HIGH (ref 70–99)
Glucose-Capillary: 227 mg/dL — ABNORMAL HIGH (ref 70–99)
Glucose-Capillary: 247 mg/dL — ABNORMAL HIGH (ref 70–99)
Glucose-Capillary: 288 mg/dL — ABNORMAL HIGH (ref 70–99)

## 2012-12-24 LAB — CBC WITH DIFFERENTIAL/PLATELET
Basophils Absolute: 0.2 10*3/uL — ABNORMAL HIGH (ref 0.0–0.1)
Basophils Relative: 1 % (ref 0–1)
Eosinophils Absolute: 0.4 10*3/uL (ref 0.0–0.7)
HCT: 31.5 % — ABNORMAL LOW (ref 36.0–46.0)
Hemoglobin: 9.7 g/dL — ABNORMAL LOW (ref 12.0–15.0)
Lymphocytes Relative: 14 % (ref 12–46)
Lymphs Abs: 2.7 10*3/uL (ref 0.7–4.0)
MCH: 27.1 pg (ref 26.0–34.0)
MCHC: 30.8 g/dL (ref 30.0–36.0)
MCV: 88 fL (ref 78.0–100.0)
Neutro Abs: 14.6 10*3/uL — ABNORMAL HIGH (ref 1.7–7.7)
RDW: 13.2 % (ref 11.5–15.5)

## 2012-12-24 LAB — PHOSPHORUS: Phosphorus: 3.5 mg/dL (ref 2.3–4.6)

## 2012-12-24 SURGERY — REPLACEMENT, WOUND VAC DRESSING, ABDOMEN
Anesthesia: General | Site: Abdomen | Wound class: Dirty or Infected

## 2012-12-24 MED ORDER — 0.9 % SODIUM CHLORIDE (POUR BTL) OPTIME
TOPICAL | Status: DC | PRN
  Administered 2012-12-24: 1000 mL

## 2012-12-24 MED ORDER — LACTATED RINGERS IV SOLN
INTRAVENOUS | Status: DC | PRN
  Administered 2012-12-24: 11:00:00 via INTRAVENOUS

## 2012-12-24 MED ORDER — FAT EMULSION 20 % IV EMUL
240.0000 mL | INTRAVENOUS | Status: AC
  Administered 2012-12-24: 240 mL via INTRAVENOUS
  Filled 2012-12-24: qty 250

## 2012-12-24 MED ORDER — ROCURONIUM BROMIDE 100 MG/10ML IV SOLN
INTRAVENOUS | Status: DC | PRN
  Administered 2012-12-24: 20 mg via INTRAVENOUS
  Administered 2012-12-24: 10 mg via INTRAVENOUS
  Administered 2012-12-24: 20 mg via INTRAVENOUS
  Administered 2012-12-24: 30 mg via INTRAVENOUS
  Administered 2012-12-24: 20 mg via INTRAVENOUS

## 2012-12-24 MED ORDER — POTASSIUM CHLORIDE 10 MEQ/50ML IV SOLN
10.0000 meq | INTRAVENOUS | Status: AC
  Administered 2012-12-24 (×2): 10 meq via INTRAVENOUS

## 2012-12-24 MED ORDER — POTASSIUM CHLORIDE 10 MEQ/50ML IV SOLN
10.0000 meq | INTRAVENOUS | Status: AC
  Administered 2012-12-24 (×3): 10 meq via INTRAVENOUS
  Filled 2012-12-24: qty 250

## 2012-12-24 MED ORDER — SODIUM CHLORIDE 0.9 % IV SOLN
INTRAVENOUS | Status: DC
  Administered 2012-12-24: 1.8 [IU]/h via INTRAVENOUS
  Administered 2012-12-24: 10.2 [IU]/h via INTRAVENOUS
  Administered 2012-12-25: 11 [IU]/h via INTRAVENOUS
  Filled 2012-12-24 (×4): qty 1

## 2012-12-24 MED ORDER — VECURONIUM BROMIDE 10 MG IV SOLR
INTRAVENOUS | Status: DC | PRN
  Administered 2012-12-24: 2 mg via INTRAVENOUS
  Administered 2012-12-24: 4 mg via INTRAVENOUS

## 2012-12-24 MED ORDER — TRACE MINERALS CR-CU-F-FE-I-MN-MO-SE-ZN IV SOLN
INTRAVENOUS | Status: AC
  Administered 2012-12-24: 17:00:00 via INTRAVENOUS
  Filled 2012-12-24: qty 2400

## 2012-12-24 SURGICAL SUPPLY — 35 items
CANISTER SUCT LVC 12 LTR MEDI- (MISCELLANEOUS) IMPLANT
CANISTER SUCTION 2500CC (MISCELLANEOUS) ×2 IMPLANT
CANISTER WOUND CARE 500ML ATS (WOUND CARE) ×2 IMPLANT
CLOTH BEACON ORANGE TIMEOUT ST (SAFETY) ×2 IMPLANT
COVER SURGICAL LIGHT HANDLE (MISCELLANEOUS) ×2 IMPLANT
DEVICE TROCAR PUNCTURE CLOSURE (ENDOMECHANICALS) ×2 IMPLANT
DRAPE LAPAROSCOPIC ABDOMINAL (DRAPES) ×2 IMPLANT
DRAPE UTILITY 15X26 W/TAPE STR (DRAPE) ×4 IMPLANT
DRSG VAC ATS LRG SENSATRAC (GAUZE/BANDAGES/DRESSINGS) ×2 IMPLANT
ELECT REM PT RETURN 9FT ADLT (ELECTROSURGICAL) ×2
ELECTRODE REM PT RTRN 9FT ADLT (ELECTROSURGICAL) ×1 IMPLANT
GLOVE BIO SURGEON STRL SZ7.5 (GLOVE) ×2 IMPLANT
GLOVE BIOGEL PI IND STRL 6.5 (GLOVE) ×1 IMPLANT
GLOVE BIOGEL PI IND STRL 7.5 (GLOVE) ×1 IMPLANT
GLOVE BIOGEL PI INDICATOR 6.5 (GLOVE) ×1
GLOVE BIOGEL PI INDICATOR 7.5 (GLOVE) ×1
GLOVE ECLIPSE 6.5 STRL STRAW (GLOVE) ×2 IMPLANT
GLOVE SURG SIGNA 7.5 PF LTX (GLOVE) ×2 IMPLANT
GLOVE SURG SS PI 7.0 STRL IVOR (GLOVE) ×2 IMPLANT
GOWN STRL NON-REIN LRG LVL3 (GOWN DISPOSABLE) ×2 IMPLANT
GOWN STRL REIN XL XLG (GOWN DISPOSABLE) ×2 IMPLANT
GRAFT FLEX HD 20X25 THICK (Tissue Mesh) ×2 IMPLANT
KIT BASIN OR (CUSTOM PROCEDURE TRAY) ×2 IMPLANT
KIT ROOM TURNOVER OR (KITS) ×2 IMPLANT
MARKER SKIN DUAL TIP RULER LAB (MISCELLANEOUS) ×2 IMPLANT
NEEDLE 1/2 CIR MAYO (NEEDLE) ×2 IMPLANT
NS IRRIG 1000ML POUR BTL (IV SOLUTION) ×4 IMPLANT
PACK GENERAL/GYN (CUSTOM PROCEDURE TRAY) ×2 IMPLANT
PAD ARMBOARD 7.5X6 YLW CONV (MISCELLANEOUS) ×4 IMPLANT
SPONGE ABDOMINAL VAC ABTHERA (MISCELLANEOUS) IMPLANT
SUCTION POOLE TIP (SUCTIONS) ×2 IMPLANT
SUT NOVA 1 T20/GS 25DT (SUTURE) ×10 IMPLANT
TOWEL OR 17X24 6PK STRL BLUE (TOWEL DISPOSABLE) ×2 IMPLANT
TOWEL OR 17X26 10 PK STRL BLUE (TOWEL DISPOSABLE) ×2 IMPLANT
WATER STERILE IRR 1000ML POUR (IV SOLUTION) ×2 IMPLANT

## 2012-12-24 NOTE — Progress Notes (Signed)
PARENTERAL NUTRITION CONSULT NOTE - Follow-up  Pharmacy Consult for TPN Indication: Open abdomen s/p hernia repair, small bowel resection; unable to feed enterally  Allergies  Allergen Reactions  . Corn-Containing Products Nausea And Vomiting    Stomach inflamed  . Sulfa Antibiotics     Childhood reaction    Patient Measurements: Height: 5\' 7"  (170.2 cm) Weight: 316 lb 9.3 oz (143.6 kg) IBW/kg (Calculated) : 61.6 Adjusted weight: 68 Kg  Vital Signs: Temp: 98.4 F (36.9 C) (02/12 0800) Temp src: Oral (02/12 0800) BP: 116/65 mmHg (02/12 0832) Pulse Rate: 90 (02/12 0832) Intake/Output from previous day: 02/11 0701 - 02/12 0700 In: 3714.2 [I.V.:969.2; NG/GT:60; IV Piggyback:185; TPN:2500] Out: 2965 [Urine:2015; Emesis/NG output:550; Drains:400] Intake/Output from this shift: Total I/O In: 181.8 [I.V.:51.8; NG/GT:30; TPN:100] Out: 30 [Urine:30]  Labs:  Recent Labs  12/22/12 0430 12/24/12 0426  WBC 16.5* 19.5*  HGB 9.9* 9.7*  HCT 31.3* 31.5*  PLT 357 409*     Recent Labs  12/22/12 0430 12/23/12 0400 12/24/12 0426  NA 143 139 141  K 3.4* 3.5 3.4*  CL 98 98 99  CO2 36* 32 31  GLUCOSE 187* 270* 261*  BUN 17 18 27*  CREATININE 1.00 1.03 1.12*  CALCIUM 9.8 8.9 9.3  MG 1.7 1.6 1.7  PHOS 3.4 3.5 3.5  PROT 6.9  --   --   ALBUMIN 2.2*  --   --   AST 48*  --   --   ALT 26  --   --   ALKPHOS 105  --   --   BILITOT 0.3  --   --   PREALBUMIN 13.3*  --   --   TRIG 198*  --   --   CHOL 142  --   --    Estimated Creatinine Clearance: 92.5 ml/min (by C-G formula based on Cr of 1.12).    Recent Labs  12/23/12 2026 12/23/12 2355 12/24/12 0433  GLUCAP 251* 288* 247*   Insulin Requirements in the past 24 hours:  Insulin gtt started ~ 0300  Nutrition Needs: (new per RD note 2/10) Kcals: 2250 Protein 125-135 g  Current Nutrition:  Clinimix E 5/15 at 140ml/hr (120 gm protein/day and avg of 1920 Kcal/d)  Assessment: 76 yof admitted 2/3 with an  incarcerated abdominal wall hernia and perforated small bowel. Emergently went to the OR for ex-lap, LOA, perforation repair and bowel resection. Back to OR on 2/5 for exploration, unable to close fascia due to edema. TPN initiated 2/8 due to anticipated prolonged NPO status.  GI: Open abdomen s/p perf repair, small bowel resection. 2/10: Partial closure abdominal fascia with a VAC change, unable to close abd. Plan OR 2/12 for further closure of abdomen. NG 550, Drains 400 out.  Endo: Hx DM (A1c 8.8) - CBGs > 200 since last bag TPN started, now on insulin gtt. Spoke with RN and discontinued SSI and Lantus while in insulin drip.  Lytes: K 3.4, low, other lytes ok.  Renal: Hx CKD. S/p CRRT for oliguric ARF (CRRT stopped 2/5). Scr 1.12, UOP 0.6 with lasix  Pulm: VDRF; 40% Fio2  Cards: Hx HTN -VSS off pressors,(PTA lotrel, triam/hctz)  Hepatobil: Tbili WNL. AST slightly elevated 48. Trig 180 to 198, TC 131 to 142. Prealbumin low at baseline, expected with post-op status and ongoing inflammation. Would expect slow progress.  ID: Zosyn empiric for peritonitis. Completed micafungin. Afebrile, WBC 19.5  Best Practices: heparin SQ, PPI IV, MC  TPN Access: CVC  TPN  day#: 4  Plan:  - Continue Clinimix E 5/15 at 13ml/hr -would hold off on advancing TPN further d/t high insulin requirement. - Will d/c insulin from TPN bag tonight, and leave instructions for RN to reset glucommander when new TPN bag hangs. This way there is only one source of titrated insulin. - Lipids + MVI + TE on MWF only d/t Sport and exercise psychologist - Will give 5 runs of KCL today - Will f/up labs in AM  Thanks, Vita Currin K. Allena Katz, PharmD, BCPS.  Clinical Pharmacist Pager [phone]. 12/24/2012 9:34 AM

## 2012-12-24 NOTE — Anesthesia Postprocedure Evaluation (Signed)
  Anesthesia Post-op Note  Patient: Erin Schultz  Procedure(s) Performed: Procedure(s): ABDOMINAL VACUUM ASSISTED CLOSURE CHANGE (N/A)  Patient Location: PACU  Anesthesia Type:General  Level of Consciousness: awake  Airway and Oxygen Therapy: Patient Spontanous Breathing  Post-op Pain: mild  Post-op Assessment: Post-op Vital signs reviewed  Post-op Vital Signs: Reviewed  Complications: No apparent anesthesia complications

## 2012-12-24 NOTE — Transfer of Care (Signed)
Immediate Anesthesia Transfer of Care Note  Patient: Erin Schultz  Procedure(s) Performed: Procedure(s): ABDOMINAL VACUUM ASSISTED CLOSURE CHANGE (N/A)  Patient Location: ICU  Anesthesia Type:General  Level of Consciousness: sedated and unresponsive  Airway & Oxygen Therapy: Patient remains intubated per anesthesia plan and Patient placed on Ventilator (see vital sign flow sheet for setting)  Post-op Assessment: Report given to PACU RN and Post -op Vital signs reviewed and stable  Post vital signs: Reviewed and stable  Complications: No apparent anesthesia complications

## 2012-12-24 NOTE — Anesthesia Preprocedure Evaluation (Addendum)
Anesthesia Evaluation  Patient identified by MRN, date of birth, ID band Patient unresponsive    Reviewed: Allergy & Precautions, H&P , NPO status , Patient's Chart, lab work & pertinent test results, reviewed documented beta blocker date and time , Unable to perform ROS - Chart review only  Airway      Comment: Intubated Dental   Pulmonary neg pulmonary ROS,  resp failure     rales    Cardiovascular hypertension, Rhythm:Regular Rate:Normal     Neuro/Psych negative neurological ROS     GI/Hepatic hiatal hernia,   Endo/Other  diabetesMorbid obesity  Renal/GU Renal disease     Musculoskeletal   Abdominal   Peds  Hematology   Anesthesia Other Findings   Reproductive/Obstetrics                          Anesthesia Physical Anesthesia Plan  ASA: IV  Anesthesia Plan: General   Post-op Pain Management:    Induction:   Airway Management Planned: Oral ETT  Additional Equipment:   Intra-op Plan:   Post-operative Plan: Post-operative intubation/ventilation  Informed Consent: I have reviewed the patients History and Physical, chart, labs and discussed the procedure including the risks, benefits and alternatives for the proposed anesthesia with the patient or authorized representative who has indicated his/her understanding and acceptance.   Consent reviewed with POA  Plan Discussed with: CRNA, Anesthesiologist and Surgeon  Anesthesia Plan Comments:         Anesthesia Quick Evaluation

## 2012-12-24 NOTE — Op Note (Signed)
NAMELAVONN, Erin Schultz NO.:  [id number]  MEDICAL RECORD NO.:  [id number]  LOCATION:  2309                         FACILITY:  [facility]  PHYSICIAN:  Sandria Bales. Ezzard Standing, M.D.  DATE OF BIRTH:  06/11/1966  DATE OF PROCEDURE:  12/24/2012                              OPERATIVE REPORT   PREOPERATIVE DIAGNOSIS:  Open abdominal wound, approximately 15 x 20 cm.  POSTOPERATIVE DIAGNOSIS:  Open abdominal wound, approximately 15 x 20 cm.  PROCEDURE:  Closure of abdominal wound with MTF Flex-HD biologic mesh (20 x 25 cm) and VAC coverage.  SURGEON:  Sandria Bales. Ezzard Standing, M.D.  FIRST ASSISTANT:  None.  ANESTHESIA:  General endotracheal.  ESTIMATED BLOOD LOSS:  Minimal.  INDICATION FOR PROCEDURE:  Erin Schultz is a 47 year old AfricanAmerican female who was presented with a small-bowel obstruction, requiring a small bowel resection and enterolysis by Dr. Almond Lint on the December 16, 2012.  She had a preexisting hernia prior to this presentation and had enough edema and loss of domain, that the fascia could not be closed.  Dr.Ingram returned to the operating room on February 5th and February 7th.  I returned to the operating room on February 10th and try to partially close her fascia.  She now comes for another VAC change with an attempt to close this fascia further.  Operative note: The patient was taken to room #1 in [facility].  She was already intubated and was transferred from the 2300.  A time-out was held and surgical checklist run.  She again had eviscerated part of a small bowel.  I tried to partially close her wound last time and placed #1 Novafil sutures both cranially and caudadly in the wound to close it down to about 10 x 13 cm, but all these four sutures that I placed had broken.  It was clear that she will not withstand any further primary fascial closure.    I thought that the wound is clean.  This was a time to go and a try to get coverage with the biologic mesh  in hopes of keeping her from eviscerating and giving her some type of stable abdominal wall.  Therefore, her fascial defect measured about 15 x 20 cm and the fascia.  I used a piece of 20 x 25 MTF Flex-HD mesh and I sewed this in place with transfascial #1 Novafil sutures in a subfascial pattern.  I placed 38 of these sutures to hold the mesh in place and doing this trans fascially using either a free needle or the EndoCatch.  After the mesh was in place, it covered the entire small bowel.  I thought that there was no obvious defects around the edge of the mesh. I had irrigated the abdomen with about 5 liters of saline prior to applying the mesh.  I irrigated with another liter after mesh placement and then I placed a VAC gauze over the Flex-HD mesh.  The VAC wound looked good.  She was transferred back to 2300 in stable condition.  We will plan again the next VAC change on Friday and the question will be back to the operating room from bedside and then go from there.  Sandria Bales. Ezzard Standing, M.D., FACS   DHN/MEDQ  D:  12/24/2012  T:  12/24/2012  Job:  [id number]  cc:   Erin Cradle. Delford Field, MD, FCCP

## 2012-12-24 NOTE — Progress Notes (Signed)
PULMONARY  / CRITICAL CARE MEDICINE  Name: Erin Schultz MRN: [id number] DOB: November 10, 1966    ADMISSION DATE:  12/15/2012 CONSULTATION DATE:  12/16/12  REFERRING MD :  Donell Beers ([facility])  CHIEF COMPLAINT:  Abdominal pain  BRIEF PATIENT DESCRIPTION: 47 y/o morbidly obese female was admitted on 12/15/12 with an incarcerated abdominal wall hernia and perforated small bowel requiring emergent surgery.  PCCM consulted for post op vent management.  SIGNIFICANT EVENTS / STUDIES:  2/3 - CT abdomen>> large anterior abdominal wall hernia, perforation, and complex fluid collection deep to the bowel; small L kidney, likely poorly functioning R kidney given lack of contrast.  Fatty infiltration of liver 2/3 -  ex-lap, lysis of adhesions, perforation repair, 25cm of bowel resection 2/4 - ARF, limited urine output 2/5 - OR for reexploration - No evidence of infection, ischemia, or leak. Unable to close the fascia due to edema.  2/6 - neg balance, hd cath removed 2/8 - TNA started, net neg 3600 L 2/9 - stable, VAC remains in place  LINES / TUBES: 2/3 ETT >> 2/3 R IJ CVL >> 2/3 radial a-line >> 2/4 left ij HD>>>2/6  CULTURES: 2/4 blood >>neg 2/3 urine >>contam  ANTIBIOTICS: 2/4 Ertapenem >>2/4 2/4 Micafungin>>>2/8 2/4 vancomycin>>>2/6 2/4 Imipenem>>>2/7 2/7 zosyn>>>  SUBJECTIVE: planning back to OR on 2/12, hopefully will be able to close abd wound Tolerated PSV8 + PEEP 8 2/11 and again this am  VITAL SIGNS: Temp:  [98 F (36.7 C)-99 F (37.2 C)] 98.4 F (36.9 C) (02/12 0800) Pulse Rate:  [89-102] 93 (02/12 0900) Resp:  [0-32] 27 (02/12 0900) BP: (93-127)/(57-78) 108/57 mmHg (02/12 0900) SpO2:  [95 %-100 %] 97 % (02/12 0900) FiO2 (%):  [40 %] 40 % (02/12 0832) Weight:  [143.6 kg (316 lb 9.3 oz)] 143.6 kg (316 lb 9.3 oz) (02/12 0500)  HEMODYNAMICS: CVP:  [11 mmHg-14 mmHg] 11 mmHg  VENTILATOR SETTINGS: Vent Mode:  [-] PSV;CPAP FiO2 (%):  [40 %] 40 % Set Rate:  [12 bmp] 12 bmp Vt Set:   [430 mL] 430 mL PEEP:  [8 cmH20] 8 cmH20 Pressure Support:  [8 cmH20-12 cmH20] 12 cmH20 Plateau Pressure:  [4 cmH20-23 cmH20] 23 cmH20  INTAKE / OUTPUT: Intake/Output     02/11 0701 - 02/12 0700 02/12 0701 - 02/13 0700   I.V. (mL/kg) 969.2 (6.7) 108.7 (0.8)   NG/GT 60 30   IV Piggyback 185    TPN 2500 200   Total Intake(mL/kg) 3714.2 (25.9) 338.7 (2.4)   Urine (mL/kg/hr) 2015 (0.6) 80 (0.2)   Emesis/NG output 550 (0.2)    Drains 400 (0.1)    Total Output 2965 80   Net +749.2 +258.7          PHYSICAL EXAMINATION:  Gen: Morbidly obese, rass 0 HEENT:  Obese, EET, line clean PULM: diminished in bases CV: tachy, regular, no mgr, no JVD AB: no bowel sounds, anterior wound with wound vac in place Ext: cool, pitting edema in legs improved Derm: wound open Neuro: awake on vent, rass at goal, follows commands. No focal deficits   LABS:  Recent Labs Lab 12/21/12 0400 12/22/12 0430 12/24/12 0426  HGB 9.4* 9.9* 9.7*  HCT 29.4* 31.3* 31.5*  WBC 18.2* 16.5* 19.5*  PLT 318 357 409*    Recent Labs Lab 12/18/12 0340  12/20/12 0442 12/21/12 0400 12/22/12 0430 12/23/12 0400 12/24/12 0426  NA 140  < > 144 142 143 139 141  K 4.1  < > 3.7 3.6 3.4* 3.5 3.4*  CL 106  < > 105 100 98 98 99  CO2 25  < > 28 30 36* 32 31  GLUCOSE 131*  < > 113* 165* 187* 270* 261*  BUN 13  < > 16 17 17 18  27*  CREATININE 0.96  < > 0.99 1.07 1.00 1.03 1.12*  CALCIUM 8.2*  < > 9.3 9.5 9.8 8.9 9.3  MG  --   --   --  1.7 1.7 1.6 1.7  PHOS 2.7  --   --  4.7* 3.4 3.5 3.5  < > = values in this interval not displayed.  Recent Labs Lab 12/18/12 0336 12/18/12 1239 12/19/12 0925 12/19/12 1404 12/20/12 0610  PHART 7.414 7.446 7.416 7.408 7.419  PCO2ART 40.5 41.0 39.5 39.7 45.5*  PO2ART 61.9* 61.0* 70.7* 85.8 100.0  HCO3 25.4* 28.3* 24.9* 24.5* 29.0*  TCO2 26.7 29 26.1 25.7 30.4  O2SAT 93.0 92.0 96.3 96.4 97.7      ASSESSMENT / PLAN:  Principal Problem:   Septic shock(785.52) Active  Problems:   Small bowel obstruction   Acute respiratory failure with hypoxia   Hernia, umbilical, with gangrene   Acute peritonitis   Bowel perforation   Severe sepsis(995.92)   Protein-calorie malnutrition, moderate   PULMONARY A: 1) Post op respiratory failure -  P:   - Will not extubate until abdomen closed, If unable on 2/12, ? Consider trach to facilitate OR visits - follow CXR - allow neg balance to improve O2 needs, lasix BID as renal fxn tolerates - would extubate from ps 8, cpap 8 if needed, given body habitus - will need outpt sleep study  CARDIOVASCULAR A:  1) Shock, presumably septic -  RESOLVED.  P:  - Neg balance tolerated - underlying septic source addressed  RENAL A:   1) Oliguric renal failure- resolved   P:   - F/u Chem - goal I=O >> restarted lasix 2/11 - Monitor urine output  GASTROINTESTINAL A:   1) Abdominal hernia, bowel strangulation and perforation now s/p exlap, partial small bowel resection P:   - ppi - NPO - cont TPN  - await repeat OR trip for closure 2/12  HEMATOLOGIC A:   1) MIld anemia P:  - sub q hep - follow CBC  INFECTIOUS A:   1) Septic shock from peritonitis, s/p surgical repair and cleanout  P:   - remains culture neg - continue zosyn, day 9 total abx on 2/12 - will continue abx until wound closed and hemodynamically stable   ENDOCRINE A:  1) DM2 P:   - ICU hyperglycemia protocol - lantus added 2/11 pm   NEUROLOGIC A:   1) Post op pain control 2) Vent synchrony  P:   -fentanyl gtt -versed gtt, goal to dc this post op closure   I have personally obtained a history, examined the patient, evaluated laboratory and imaging results, formulated the assessment and plan and placed orders.  CRITICAL CARE: The patient is critically ill with multiple organ systems failure and requires high complexity decision making for assessment and support, frequent evaluation and titration of therapies, application of  advanced monitoring technologies and extensive interpretation of multiple databases. Critical Care Time devoted to patient care services described in this note is 30 minutes.   Levy Pupa, MD, PhD 12/24/2012, 9:44 AM [facility] [phone] or if no answer [phone]

## 2012-12-25 ENCOUNTER — Inpatient Hospital Stay (HOSPITAL_COMMUNITY): Payer: BC Managed Care – PPO

## 2012-12-25 LAB — GLUCOSE, CAPILLARY
Glucose-Capillary: 125 mg/dL — ABNORMAL HIGH (ref 70–99)
Glucose-Capillary: 126 mg/dL — ABNORMAL HIGH (ref 70–99)
Glucose-Capillary: 129 mg/dL — ABNORMAL HIGH (ref 70–99)
Glucose-Capillary: 129 mg/dL — ABNORMAL HIGH (ref 70–99)
Glucose-Capillary: 132 mg/dL — ABNORMAL HIGH (ref 70–99)
Glucose-Capillary: 139 mg/dL — ABNORMAL HIGH (ref 70–99)
Glucose-Capillary: 140 mg/dL — ABNORMAL HIGH (ref 70–99)
Glucose-Capillary: 141 mg/dL — ABNORMAL HIGH (ref 70–99)
Glucose-Capillary: 147 mg/dL — ABNORMAL HIGH (ref 70–99)
Glucose-Capillary: 149 mg/dL — ABNORMAL HIGH (ref 70–99)
Glucose-Capillary: 186 mg/dL — ABNORMAL HIGH (ref 70–99)

## 2012-12-25 LAB — CBC WITH DIFFERENTIAL/PLATELET
Basophils Relative: 1 % (ref 0–1)
Eosinophils Relative: 1 % (ref 0–5)
Hemoglobin: 9.4 g/dL — ABNORMAL LOW (ref 12.0–15.0)
Lymphocytes Relative: 13 % (ref 12–46)
MCH: 27.6 pg (ref 26.0–34.0)
Monocytes Absolute: 1.7 10*3/uL — ABNORMAL HIGH (ref 0.1–1.0)
Monocytes Relative: 7 % (ref 3–12)
Neutrophils Relative %: 78 % — ABNORMAL HIGH (ref 43–77)
Platelets: 433 10*3/uL — ABNORMAL HIGH (ref 150–400)
RBC: 3.4 MIL/uL — ABNORMAL LOW (ref 3.87–5.11)
WBC: 24.7 10*3/uL — ABNORMAL HIGH (ref 4.0–10.5)

## 2012-12-25 LAB — COMPREHENSIVE METABOLIC PANEL
Albumin: 2.1 g/dL — ABNORMAL LOW (ref 3.5–5.2)
BUN: 36 mg/dL — ABNORMAL HIGH (ref 6–23)
Calcium: 9.3 mg/dL (ref 8.4–10.5)
Creatinine, Ser: 1.3 mg/dL — ABNORMAL HIGH (ref 0.50–1.10)
GFR calc Af Amer: 56 mL/min — ABNORMAL LOW (ref >90)
Glucose, Bld: 140 mg/dL — ABNORMAL HIGH (ref 70–99)
Total Protein: 6.4 g/dL (ref 6.0–8.3)

## 2012-12-25 LAB — PHOSPHORUS: Phosphorus: 3.6 mg/dL (ref 2.3–4.6)

## 2012-12-25 LAB — MAGNESIUM: Magnesium: 1.7 mg/dL (ref 1.5–2.5)

## 2012-12-25 MED ORDER — SODIUM CHLORIDE 0.9 % IV SOLN
INTRAVENOUS | Status: DC
  Filled 2012-12-25: qty 1

## 2012-12-25 MED ORDER — INSULIN GLARGINE 100 UNIT/ML ~~LOC~~ SOLN: 10.0000 [IU] | Freq: Every day | SUBCUTANEOUS | Status: DC

## 2012-12-25 MED ORDER — INSULIN GLARGINE 100 UNIT/ML ~~LOC~~ SOLN
10.0000 [IU] | Freq: Every day | SUBCUTANEOUS | Status: DC
  Administered 2012-12-25: 10 [IU] via SUBCUTANEOUS

## 2012-12-25 MED ORDER — FENTANYL CITRATE 0.05 MG/ML IJ SOLN
50.0000 ug | INTRAMUSCULAR | Status: DC | PRN
  Administered 2012-12-25 – 2012-12-27 (×9): 100 ug via INTRAVENOUS
  Administered 2012-12-29: 50 ug via INTRAVENOUS
  Filled 2012-12-25 (×10): qty 2

## 2012-12-25 MED ORDER — CLINIMIX E/DEXTROSE (5/15) 5 % IV SOLN
INTRAVENOUS | Status: AC
  Administered 2012-12-25: 17:00:00 via INTRAVENOUS
  Filled 2012-12-25: qty 2400

## 2012-12-25 NOTE — Progress Notes (Signed)
General surgery attending note:  Patient interviewed and examined. I spoke with the family and with Dr. Delton Coombes. Agree with the assessment and treatment plan outlined by Ms. Dort, PA.  The abdominal fascia has now been closed with an MTF Flex-HD biologic mesh bridge, and her VAC can be changed at the bedside Monday Wednesday Friday, and she does not need to return to OR.  Discussed ventilator issues with Dr. Delton Coombes. She might be able to be weaned and extubated, but she she might require a trach. Will standby once CCM is able to assess this decision.   Angelia Mould. Derrell Lolling, M.D., [facility] General and Minimally invasive Surgery Breast and Colorectal Surgery Office:   [phone] Pager:   [phone]

## 2012-12-25 NOTE — Progress Notes (Signed)
Rehab Admissions Coordinator Note:  Patient was screened by Meryl Dare for appropriateness for an Inpatient Acute Rehab Consult.  At this time, we are recommending Inpatient Rehab consult. Will need OT evaluation to send to insurance co.  Meryl Dare 12/25/2012, 2:16 PM  I can be reached at [phone].

## 2012-12-25 NOTE — Progress Notes (Signed)
Inpatient Diabetes Program Recommendations  AACE/ADA: New Consensus Statement on Inpatient Glycemic Control (2013)  Target Ranges:  Prepandial:   less than 140 mg/dL      Peak postprandial:   less than 180 mg/dL (1-2 hours)      Critically ill patients:  140 - 180 mg/dL   Noted regular insulin 40 units added to TPN today and Lantus 10 units given. Insulin gtt rate is >10 units per hour. Will likely need more than 40 units in TPN.  GlucoStabilizer will adjust accordingly.  Thank you  Piedad Climes BSN, RN,CDE Inpatient Diabetes Coordinator [phone] (team pager)

## 2012-12-25 NOTE — Progress Notes (Signed)
PARENTERAL NUTRITION CONSULT NOTE - Follow-up  Pharmacy Consult for TPN Indication: Open abdomen s/p hernia repair, small bowel resection; unable to feed enterally  Allergies  Allergen Reactions  . Corn-Containing Products Nausea And Vomiting    Stomach inflamed  . Sulfa Antibiotics     Childhood reaction    Patient Measurements: Height: 5\' 7"  (170.2 cm) Weight: 316 lb 9.3 oz (143.6 kg) IBW/kg (Calculated) : 61.6 Adjusted weight: 68 Kg  Vital Signs: Temp: 100.4 F (38 C) (02/13 0755) Temp src: Oral (02/13 0755) BP: 114/56 mmHg (02/13 0900) Pulse Rate: 102 (02/13 0900) Intake/Output from previous day: 02/12 0701 - 02/13 0700 In: 5087.3 [I.V.:2247.3; NG/GT:60; IV Piggyback:350; TPN:2430] Out: 1415 [Urine:1065; Emesis/NG output:100; Drains:250] Intake/Output from this shift: Total I/O In: 463.2 [I.V.:103.2; NG/GT:30; TPN:330] Out: 150 [Urine:150]  Labs:  Recent Labs  12/24/12 0426 12/25/12 0400  WBC 19.5* 24.7*  HGB 9.7* 9.4*  HCT 31.5* 30.1*  PLT 409* 433*     Recent Labs  12/23/12 0400 12/24/12 0426 12/25/12 0400  NA 139 141 139  K 3.5 3.4* 4.1  CL 98 99 101  CO2 32 31 29  GLUCOSE 270* 261* 140*  BUN 18 27* 36*  CREATININE 1.03 1.12* 1.30*  CALCIUM 8.9 9.3 9.3  MG 1.6 1.7 1.7  PHOS 3.5 3.5 3.6  PROT  --   --  6.4  ALBUMIN  --   --  2.1*  AST  --   --  42*  ALT  --   --  34  ALKPHOS  --   --  99  BILITOT  --   --  0.2*   Estimated Creatinine Clearance: 79.7 ml/min (by C-G formula based on Cr of 1.3).    Recent Labs  12/24/12 1945 12/24/12 2202 12/24/12 2305  GLUCAP 138* 128* 124*   Insulin Requirements in the past 24 hours:  Insulin gtt @ 11 units/hr  Nutrition Needs: (new per RD note 2/10) Kcals: 2250 Protein 125-135 g  Current Nutrition:  Clinimix E 5/15 at 150ml/hr (120 gm protein/day and avg of 1920 Kcal/d)  Assessment: 59 yof admitted 2/3 with an incarcerated abdominal wall hernia and perforated small bowel. Emergently went  to the OR for ex-lap, LOA, perforation repair and bowel resection. Back to OR on 2/5 for exploration, unable to close fascia due to edema. TPN initiated 2/8 due to anticipated prolonged NPO status.  GI: Open abdomen s/p perf repair, small bowel resection. 2/10: Partial closure abdominal fascia with a VAC change, unable to close abd. 2/12 OR for closure of abdomen and vac placement. NG 100, Drains 250 out, both down from yesterday.  Endo: Hx DM (A1c 8.8) - CBGs controlled on insulin infusion, an average of 11 units/hr. RN requesting Lantus to get patient off insulin drip.   LytesJudieth Keens are ok.   Renal: Hx CKD. S/p CRRT for oliguric ARF (CRRT stopped 2/5). Scr incre to 1.3, UOP low at 0.3 (last lasix dose at 1000 2/13)  Pulm: VDRF; 40% Fio2  Cards: Hx HTN -VSS off pressors,(PTA lotrel, triam/hctz)  Hepatobil: Tbili WNL. AST slightly elevated 42. Trig 180 to 198, TC 131 to 142. Prealbumin low at baseline, expected with post-op status and ongoing inflammation. Would expect slow progress.  ID: Zosyn empiric for peritonitis. Completed micafungin. Febrile 100.4, WBC incr to 24.7, but pt also post-op  Best Practices: heparin SQ, PPI IV, MC  TPN Access: CVC  TPN day#: 5  Plan:  - Continue Clinimix E 5/15 at 172ml/hr -would  hold off on advancing TPN further d/t high insulin requirement. - Will order 10 units of Lantus now and daily, and add 40 units to TPN bag tonight, to see if we can keep patient off insulin gtt. D/w Dr. Delton Coombes was agreeable. - Lipids + MVI + TE on MWF only d/t national shortage - Will f/up labs and glycemia in AM  Thanks, Lamonda Noxon K. Allena Katz, PharmD, BCPS.  Clinical Pharmacist Pager [phone]. 12/25/2012 9:25 AM

## 2012-12-25 NOTE — Progress Notes (Signed)
ANTIBIOTIC CONSULT NOTE - FOLLOW UP  Pharmacy Consult for zosyn Indication: perforated bowel, peritonitis   Allergies  Allergen Reactions  . Corn-Containing Products Nausea And Vomiting    Stomach inflamed  . Sulfa Antibiotics     Childhood reaction    Patient Measurements: Height: 5\' 7"  (170.2 cm) Weight: 316 lb 9.3 oz (143.6 kg) IBW/kg (Calculated) : 61.6   Vital Signs: Temp: 100.4 F (38 C) (02/13 0755) Temp src: Oral (02/13 0755) BP: 106/59 mmHg (02/13 1000) Pulse Rate: 102 (02/13 1000) Intake/Output from previous day: 02/12 0701 - 02/13 0700 In: 5087.3 [I.V.:2247.3; NG/GT:60; IV Piggyback:350; TPN:2430] Out: 1415 [Urine:1065; Emesis/NG output:100; Drains:250] Intake/Output from this shift: Total I/O In: 623.7 [I.V.:153.7; NG/GT:30; TPN:440] Out: 275 [Urine:275]  Labs:  Recent Labs  12/23/12 0400 12/24/12 0426 12/25/12 0400  WBC  --  19.5* 24.7*  HGB  --  9.7* 9.4*  PLT  --  409* 433*  CREATININE 1.03 1.12* 1.30*   Estimated Creatinine Clearance: 79.7 ml/min (by C-G formula based on Cr of 1.3). No results found for this basename: VANCOTROUGH, Leodis Binet, VANCORANDOM, GENTTROUGH, GENTPEAK, GENTRANDOM, TOBRATROUGH, TOBRAPEAK, TOBRARND, AMIKACINPEAK, AMIKACINTROU, AMIKACIN,  in the last 72 hours   Microbiology: Recent Results (from the past 720 hour(s))  URINE CULTURE     Status: None   Collection Time    12/15/12  1:36 PM      Result Value Range Status   Specimen Description URINE, RANDOM   Final   Special Requests NONE   Final   Culture  Setup Time 12/15/2012 14:35   Final   Colony Count 50,000 COLONIES/ML   Final   Culture     Final   Value: Multiple bacterial morphotypes present, none predominant. Suggest appropriate recollection if clinically indicated.   Report Status 12/16/2012 FINAL   Final  MRSA PCR SCREENING     Status: None   Collection Time    12/16/12  2:51 AM      Result Value Range Status   MRSA by PCR NEGATIVE  NEGATIVE Final    Comment:            The GeneXpert MRSA Assay (FDA     approved for NASAL specimens     only), is one component of a     comprehensive MRSA colonization     surveillance program. It is not     intended to diagnose MRSA     infection nor to guide or     monitor treatment for     MRSA infections.  CULTURE, BLOOD (ROUTINE X 2)     Status: None   Collection Time    12/16/12  4:20 AM      Result Value Range Status   Specimen Description BLOOD RIGHT HAND   Final   Special Requests BOTTLES DRAWN AEROBIC ONLY 10CC   Final   Culture  Setup Time 12/16/2012 09:48   Final   Culture NO GROWTH 5 DAYS   Final   Report Status 12/22/2012 FINAL   Final  CULTURE, BLOOD (ROUTINE X 2)     Status: None   Collection Time    12/16/12  7:00 AM      Result Value Range Status   Specimen Description BLOOD THUMB LEFT   Final   Special Requests BOTTLES DRAWN AEROBIC ONLY 4.0CC   Final   Culture  Setup Time 12/16/2012 15:50   Final   Culture NO GROWTH 5 DAYS   Final   Report Status 12/22/2012 FINAL  Final    Anti-infectives   Start     Dose/Rate Route Frequency Ordered Stop   12/19/12 1000  piperacillin-tazobactam (ZOSYN) IVPB 3.375 g     3.375 g 12.5 mL/hr over 240 Minutes Intravenous 3 times per day 12/19/12 0940     12/18/12 1800  imipenem-cilastatin (PRIMAXIN) 500 mg in sodium chloride 0.9 % 100 mL IVPB  Status:  Discontinued     500 mg 200 mL/hr over 30 Minutes Intravenous 4 times per day 12/18/12 1402 12/19/12 0923   12/18/12 0900  vancomycin (VANCOCIN) 2,000 mg in sodium chloride 0.9 % 500 mL IVPB  Status:  Discontinued     2,000 mg 250 mL/hr over 120 Minutes Intravenous Every 48 hours 12/16/12 0927 12/17/12 1236   12/17/12 1400  vancomycin (VANCOCIN) 2,000 mg in sodium chloride 0.9 % 500 mL IVPB  Status:  Discontinued     2,000 mg 250 mL/hr over 120 Minutes Intravenous Every 24 hours 12/17/12 1236 12/18/12 1005   12/17/12 1000  micafungin (MYCAMINE) 100 mg in sodium chloride 0.9 % 100 mL IVPB      100 mg 100 mL/hr over 1 Hours Intravenous Daily 12/16/12 0908 12/20/12 1059   12/16/12 2300  ertapenem (INVANZ) 1 g in sodium chloride 0.9 % 50 mL IVPB  Status:  Discontinued     1 g 100 mL/hr over 30 Minutes Intravenous Every 24 hours 12/16/12 0230 12/16/12 0908   12/16/12 1200  imipenem-cilastatin (PRIMAXIN) 250 mg in sodium chloride 0.9 % 100 mL IVPB  Status:  Discontinued     250 mg 200 mL/hr over 30 Minutes Intravenous 4 times per day 12/16/12 0927 12/18/12 1402   12/16/12 1000  micafungin (MYCAMINE) 100 mg in sodium chloride 0.9 % 100 mL IVPB     100 mg 100 mL/hr over 1 Hours Intravenous Daily 12/16/12 0908 12/16/12 1239   12/16/12 0930  vancomycin (VANCOCIN) 2,500 mg in sodium chloride 0.9 % 500 mL IVPB     2,500 mg 250 mL/hr over 120 Minutes Intravenous  Once 12/16/12 0927 12/16/12 1334   12/16/12 0600  ertapenem (INVANZ) 1 g in sodium chloride 0.9 % 50 mL IVPB     1 g 100 mL/hr over 30 Minutes Intravenous On call to O.R. 12/15/12 2129 12/15/12 2254   12/15/12 2015  [MAR Hold]  piperacillin-tazobactam (ZOSYN) IVPB 3.375 g     (On MAR Hold since 12/15/12 2236)   3.375 g 12.5 mL/hr over 240 Minutes Intravenous  Once 12/15/12 2006 12/16/12 0034      Assessment: Patient's a 47 y.o F on zosyn for abdominal wound and peritonitis.  S/p closure of abd wound in OR on 2/12 with plan for VAC change every MWF.  All cultures have been negative but patient remains febrile with wbc trending up. Scr trending up with 1.30 and UOP decreased yesterday to 0.93mL/kg/hr.   Plan:  1) cont current zosyn regimen 2) watch renal funct  Christophor Eick P 12/25/2012,10:16 AM

## 2012-12-25 NOTE — Progress Notes (Signed)
1 Day Post-Op  Subjective: Intubated on vent.  Pt very alert and awake, following commands.  Asking for a wash cloth.  Pt denies N/V, but has pain in upper abdomen and over wound vac.  Objective: Vital signs in last 24 hours: Temp:  [99.3 F (37.4 C)-100.5 F (38.1 C)] 100.4 F (38 C) (02/13 0755) Pulse Rate:  [90-115] 104 (02/13 0800) Resp:  [24-32] 25 (02/13 0800) BP: (97-155)/(50-91) 127/59 mmHg (02/13 0800) SpO2:  [96 %-99 %] 96 % (02/13 0800) FiO2 (%):  [40 %] 40 % (02/13 0800)    Intake/Output from previous day: 02/12 0701 - 02/13 0700 In: 5087.3 [I.V.:2247.3; NG/GT:60; IV Piggyback:350; TPN:2430] Out: 1415 [Urine:1065; Emesis/NG output:100; Drains:250] Intake/Output this shift: Total I/O In: 353.2 [I.V.:103.2; NG/GT:30; TPN:220] Out: 150 [Urine:150]  PE: Gen:  Alert, NAD, pleasant Abd: Soft, moderate tenderness over wound vac and upper abdomen, diminished BS, no HSM, incisions C/D/I, wound vac with sanguinous drainage   Lab Results:   Recent Labs  12/24/12 0426 12/25/12 0400  WBC 19.5* 24.7*  HGB 9.7* 9.4*  HCT 31.5* 30.1*  PLT 409* 433*   BMET  Recent Labs  12/24/12 0426 12/25/12 0400  NA 141 139  K 3.4* 4.1  CL 99 101  CO2 31 29  GLUCOSE 261* 140*  BUN 27* 36*  CREATININE 1.12* 1.30*  CALCIUM 9.3 9.3   PT/INR No results found for this basename: LABPROT, INR,  in the last 72 hours CMP     Component Value Date/Time   NA 139 12/25/2012 0400   K 4.1 12/25/2012 0400   CL 101 12/25/2012 0400   CO2 29 12/25/2012 0400   GLUCOSE 140* 12/25/2012 0400   BUN 36* 12/25/2012 0400   CREATININE 1.30* 12/25/2012 0400   CREATININE 0.89 06/03/2012 1546   CALCIUM 9.3 12/25/2012 0400   PROT 6.4 12/25/2012 0400   ALBUMIN 2.1* 12/25/2012 0400   AST 42* 12/25/2012 0400   ALT 34 12/25/2012 0400   ALKPHOS 99 12/25/2012 0400   BILITOT 0.2* 12/25/2012 0400   GFRNONAA 48* 12/25/2012 0400   GFRAA 56* 12/25/2012 0400   Lipase     Component Value Date/Time   LIPASE 41  12/15/2012 1323       Studies/Results: Dg Chest Port 1 View  12/25/2012  *RADIOLOGY REPORT*  Clinical Data: Evaluate ETT, lines  PORTABLE CHEST - 1 VIEW  Comparison: 12/23/2012  Findings: Cardiomegaly with pulmonary vascular congestion and suspected mild interstitial edema.  Layering small bilateral pleural effusions.  No pneumothorax.  Endotracheal tube terminates 4 cm above the carina.  Stable left IJ venous catheter.  Enteric tube likely courses below the diaphragm but is poorly visualized.  IMPRESSION: Endotracheal tube terminates 4 cm above the carina.  Cardiomegaly with pulmonary vascular congestion and suspected mild interstitial edema.  Layering small bilateral pleural effusions.   Original Report Authenticated By: Charline Bills, M.D.     Anti-infectives: Anti-infectives   Start     Dose/Rate Route Frequency Ordered Stop   12/19/12 1000  piperacillin-tazobactam (ZOSYN) IVPB 3.375 g     3.375 g 12.5 mL/hr over 240 Minutes Intravenous 3 times per day 12/19/12 0940     12/18/12 1800  imipenem-cilastatin (PRIMAXIN) 500 mg in sodium chloride 0.9 % 100 mL IVPB  Status:  Discontinued     500 mg 200 mL/hr over 30 Minutes Intravenous 4 times per day 12/18/12 1402 12/19/12 0923   12/18/12 0900  vancomycin (VANCOCIN) 2,000 mg in sodium chloride 0.9 % 500 mL IVPB  Status:  Discontinued     2,000 mg 250 mL/hr over 120 Minutes Intravenous Every 48 hours 12/16/12 0927 12/17/12 1236   12/17/12 1400  vancomycin (VANCOCIN) 2,000 mg in sodium chloride 0.9 % 500 mL IVPB  Status:  Discontinued     2,000 mg 250 mL/hr over 120 Minutes Intravenous Every 24 hours 12/17/12 1236 12/18/12 1005   12/17/12 1000  micafungin (MYCAMINE) 100 mg in sodium chloride 0.9 % 100 mL IVPB     100 mg 100 mL/hr over 1 Hours Intravenous Daily 12/16/12 0908 12/20/12 1059   12/16/12 2300  ertapenem (INVANZ) 1 g in sodium chloride 0.9 % 50 mL IVPB  Status:  Discontinued     1 g 100 mL/hr over 30 Minutes Intravenous Every 24  hours 12/16/12 0230 12/16/12 0908   12/16/12 1200  imipenem-cilastatin (PRIMAXIN) 250 mg in sodium chloride 0.9 % 100 mL IVPB  Status:  Discontinued     250 mg 200 mL/hr over 30 Minutes Intravenous 4 times per day 12/16/12 0927 12/18/12 1402   12/16/12 1000  micafungin (MYCAMINE) 100 mg in sodium chloride 0.9 % 100 mL IVPB     100 mg 100 mL/hr over 1 Hours Intravenous Daily 12/16/12 0908 12/16/12 1239   12/16/12 0930  vancomycin (VANCOCIN) 2,500 mg in sodium chloride 0.9 % 500 mL IVPB     2,500 mg 250 mL/hr over 120 Minutes Intravenous  Once 12/16/12 0927 12/16/12 1334   12/16/12 0600  ertapenem (INVANZ) 1 g in sodium chloride 0.9 % 50 mL IVPB     1 g 100 mL/hr over 30 Minutes Intravenous On call to O.R. 12/15/12 2129 12/15/12 2254   12/15/12 2015  [MAR Hold]  piperacillin-tazobactam (ZOSYN) IVPB 3.375 g     (On MAR Hold since 12/15/12 2236)   3.375 g 12.5 mL/hr over 240 Minutes Intravenous  Once 12/15/12 2006 12/16/12 0034       Assessment/Plan 1. Abdominal hernia, bowel strangulation and perforation  s/p EXPLORATORY LAPAROTOMY, enterolysis, small bowel resection - R. Byerly - 12/16/2012  ABDOMINAL VACUUM ASSISTED CLOSURE CHANGE - H. Derrell Lolling - 12/17/12 and 12/19/12  Closure of abdominal wound with MTF Flex-HD biologic mesh, 20 x 25 cm and VAC coverage. D. Newman 12/24/12 2. VDRF  3. Morbid obesity  4. Severe malnutrition - on TPN, new central line  5. On Zosyn  6. DVT prophylaxis - on SQ Heparin  Will continue to follow, wound vac change planned for tomorrow    LOS: 10 days    DORT, Aundra Millet 12/25/2012, 8:31 AM Pager: [phone]

## 2012-12-25 NOTE — Procedures (Signed)
Extubation Procedure Note  Patient Details:   Name: Erin Schultz DOB: 07/20/1966 MRN: [id number]   Airway Documentation:  Patient weaned on PSV 40%, 5/5 x 35 mins. tol well. VC 817cc's. Pt extubated and placed on [place] with oxygen running through at 4lpm.   Evaluation  O2 sats: stable throughout Complications: No apparent complications Patient did tolerate procedure well. Bilateral Breath Sounds: Expiratory wheezes;Rhonchi;Diminished Suctioning: Airway Yes Patient ordered for BiPAP qhs.  Clearance Coots 12/25/2012, 12:27 PM

## 2012-12-25 NOTE — Progress Notes (Signed)
Physical Therapy Treatment Patient Details Name: Erin Schultz MRN: [id number] DOB: [birth year] Today's Date: 12/25/2012 Time: 4098-1191 PT Time Calculation (min): 9 min  PT Assessment / Plan / Recommendation Comments on Treatment Session  Pt s/p sepsis and VDRF with abdominal wound with decr mobility secondary to decr endurance and decr balance.  Will benefit from PT to address mobility issues.      Follow Up Recommendations  CIR;Supervision/Assistance - 24 hour     Does the patient have the potential to tolerate intense rehabilitation   yes          Equipment Recommendations  Rolling walker with 5" wheels    Recommendations for Other Services Rehab consult  Frequency Min 3X/week   Plan Discharge plan remains appropriate;Frequency remains appropriate    Precautions / Restrictions Precautions Precautions: Fall Restrictions Weight Bearing Restrictions: No   Pertinent Vitals/Pain VSS, some pain    Mobility  Bed Mobility Bed Mobility: Rolling Right;Rolling Left Rolling Right: 1: +2 Total assist;With rail Rolling Right: Patient Percentage: 30% Right Sidelying to Sit: 1: +2 Total assist;With rails;HOB elevated Right Sidelying to Sit: Patient Percentage: 30% Sitting - Scoot to Edge of Bed: 2: Max assist Details for Bed Mobility Assistance: Assisted nursing to place bedpan.  Pt had a lot of difficulty with rolling needing assist to bend LE as well as assist to roll as she could not reach rail.  Pain in abdomen limiting movement considerably.  Placed bed pan with total assist of 2 persons for rolling.  Took incr time secondary to patient;s body habitus.  Had toroll several times.   Transfers Transfers: Not assessed Ambulation/Gait Ambulation/Gait Assistance: Not tested (comment) Stairs: No Wheelchair Mobility Wheelchair Mobility: No    PT Diagnosis: Generalized weakness  PT Problem List: Decreased activity tolerance;Decreased balance;Decreased mobility;Decreased knowledge of  precautions;Decreased safety awareness;Decreased knowledge of use of DME;Obesity;Pain PT Treatment Interventions: DME instruction;Gait training;Functional mobility training;Therapeutic activities;Stair training;Therapeutic exercise;Balance training;Patient/family education   PT Goals Acute Rehab PT Goals PT Goal Formulation: With patient Time For Goal Achievement: 01/08/13 Potential to Achieve Goals: Good Pt will go Supine/Side to Sit: Independently PT Goal: Supine/Side to Sit - Progress: Progressing toward goal   Visit Information  Last PT Received On: 12/25/12 Assistance Needed: +3 or more (for EOB or OOB)    Subjective Data  Subjective: "I need to get on the bed pan." Patient Stated Goal: To be independent   Cognition  Cognition Overall Cognitive Status: Appears within functional limits for tasks assessed/performed Arousal/Alertness: Awake/alert Orientation Level: Appears intact for tasks assessed Behavior During Session: [facility] for tasks performed       End of Session PT - End of Session Equipment Utilized During Treatment: Oxygen Activity Tolerance: Patient limited by fatigue;Patient limited by pain Patient left: in bed;with call bell/phone within reach;with family/visitor present Nurse Communication: Mobility status;Need for lift equipment        INGOLD,Britani Beattie 12/25/2012, 2:10 PM [facility] Acute Rehabilitation [phone] (pager)

## 2012-12-25 NOTE — Progress Notes (Signed)
PULMONARY  / CRITICAL CARE MEDICINE  Name: Erin Schultz MRN: [id number] DOB: Feb 19, 1966    ADMISSION DATE:  12/15/2012 CONSULTATION DATE:  12/16/12  REFERRING MD :  Donell Beers (CCS)  CHIEF COMPLAINT:  Abdominal pain  BRIEF PATIENT DESCRIPTION: 47 y/o morbidly obese female was admitted on 12/15/12 with an incarcerated abdominal wall hernia and perforated small bowel requiring emergent surgery.  PCCM consulted for post op vent management.  SIGNIFICANT EVENTS / STUDIES:  2/3 - CT abdomen>> large anterior abdominal wall hernia, perforation, and complex fluid collection deep to the bowel; small L kidney, likely poorly functioning R kidney given lack of contrast.  Fatty infiltration of liver 2/3 -  ex-lap, lysis of adhesions, perforation repair, 25cm of bowel resection 2/4 - ARF, limited urine output 2/5 - OR for reexploration - No evidence of infection, ischemia, or leak. Unable to close the fascia due to edema.  2/6 - neg balance, hd cath removed 2/8 - TNA started, net neg 3600 L 2/9 - stable, VAC remains in place 2/12 - closed wound with biologic mesh, still with wound vac   LINES / TUBES: 2/3 ETT >> 2/3 R IJ CVL >> 2/10 2/10 L IJ CVC >>  2/3 radial a-line >> out 2/4 left ij HD>>>2/6  CULTURES: 2/4 blood >>neg 2/3 urine >>contam  ANTIBIOTICS: 2/4 Ertapenem >>2/4 2/4 Micafungin>>>2/8 2/4 vancomycin>>>2/6 2/4 Imipenem>>>2/7 2/7 zosyn>>>  SUBJECTIVE: Closed with biologic mesh, wound vac still in place Tolerating PSV 5/8  VITAL SIGNS: Temp:  [99.3 F (37.4 C)-100.5 F (38.1 C)] 100.4 F (38 C) (02/13 0755) Pulse Rate:  [94-115] 102 (02/13 1000) Resp:  [24-32] 24 (02/13 1000) BP: (97-155)/(50-91) 106/59 mmHg (02/13 1000) SpO2:  [96 %-99 %] 97 % (02/13 1000) FiO2 (%):  [40 %] 40 % (02/13 0843)  HEMODYNAMICS: CVP:  [8 mmHg-12 mmHg] 12 mmHg  VENTILATOR SETTINGS: Vent Mode:  [-] PSV FiO2 (%):  [40 %] 40 % Set Rate:  [12 bmp] 12 bmp Vt Set:  [430 mL] 430 mL PEEP:  [8 cmH20]  8 cmH20 Pressure Support:  [12 cmH20] 12 cmH20 Plateau Pressure:  [20 cmH20-26 cmH20] 22 cmH20  INTAKE / OUTPUT: Intake/Output     02/12 0701 - 02/13 0700 02/13 0701 - 02/14 0700   I.V. (mL/kg) 2247.3 (15.6) 153.7 (1.1)   NG/GT 60 30   IV Piggyback 350    TPN 2430 440   Total Intake(mL/kg) 5087.3 (35.4) 623.7 (4.3)   Urine (mL/kg/hr) 1065 (0.3) 275 (0.6)   Emesis/NG output 100 (0)    Drains 250 (0.1)    Total Output 1415 275   Net +3672.3 +348.7          PHYSICAL EXAMINATION:  Gen: Morbidly obese, rass 0 HEENT:  Obese, EET, line clean PULM: diminished in bases CV: tachy, regular, no mgr, no JVD AB: no bowel sounds, anterior wound with wound vac in place Ext: cool, pitting edema in legs improved Derm: wound open Neuro: awake on vent, rass at goal, follows commands. No focal deficits   LABS:  Recent Labs Lab 12/22/12 0430 12/24/12 0426 12/25/12 0400  HGB 9.9* 9.7* 9.4*  HCT 31.3* 31.5* 30.1*  WBC 16.5* 19.5* 24.7*  PLT 357 409* 433*    Recent Labs Lab 12/21/12 0400 12/22/12 0430 12/23/12 0400 12/24/12 0426 12/25/12 0400  NA 142 143 139 141 139  K 3.6 3.4* 3.5 3.4* 4.1  CL 100 98 98 99 101  CO2 30 36* 32 31 29  GLUCOSE 165* 187* 270* 261* 140*  BUN 17 17 18  27* 36*  CREATININE 1.07 1.00 1.03 1.12* 1.30*  CALCIUM 9.5 9.8 8.9 9.3 9.3  MG 1.7 1.7 1.6 1.7 1.7  PHOS 4.7* 3.4 3.5 3.5 3.6    Recent Labs Lab 12/18/12 1239 12/19/12 0925 12/19/12 1404 12/20/12 0610  PHART 7.446 7.416 7.408 7.419  PCO2ART 41.0 39.5 39.7 45.5*  PO2ART 61.0* 70.7* 85.8 100.0  HCO3 28.3* 24.9* 24.5* 29.0*  TCO2 29 26.1 25.7 30.4  O2SAT 92.0 96.3 96.4 97.7    ASSESSMENT / PLAN:  Principal Problem:   Septic shock(785.52) Active Problems:   Small bowel obstruction   Acute respiratory failure with hypoxia   Hernia, umbilical, with gangrene   Acute peritonitis   Bowel perforation   Severe sepsis(995.92)   Protein-calorie malnutrition, moderate   PULMONARY A: 1)  Post op respiratory failure - B effusions P:   - Will not extubate until abdomen closed, If unable on 2/12, ? Consider trach to facilitate OR visits - follow CXR - allow neg balance to improve O2 needs, hold lasix 2/13 as S Cr rising - consider therapeutic thora if we believe this will allow her to progress more quickly - would extubate from ps 8, cpap 8 if needed, given body habitus. Goal extubate 2/13 - will need outpt sleep study; will try empiric BiPAP qhs once extubated  CARDIOVASCULAR A:  1) Shock, presumably septic -  RESOLVED.  P:  - underlying septic source addressed  RENAL A:   1) Acute renal failure-   P:   - F/u Chem - goal I=O >> restarted lasix 2/11, will hold 2/13 with rising S Cr - Monitor urine output  GASTROINTESTINAL A:   1) Abdominal hernia, bowel strangulation and perforation now s/p exlap, partial small bowel resection P:   - ppi - NPO - cont TPN  - will require wound vac changes but likely no more trips to the OR as of 2/13  HEMATOLOGIC A:   1) Mild anemia P:  - sub q hep - follow CBC  INFECTIOUS A:   1) Septic shock from peritonitis, s/p surgical repair and cleanout  P:   - remains culture neg - continue zosyn, day 10 total abx on 2/13 - will continue abx until wound closed and hemodynamically stable   ENDOCRINE A:  1) DM2 P:   - ICU hyperglycemia protocol - lantus added 2/11 pm - change to standard HG protocol once stable post-extubation, ? 2/14  NEUROLOGIC A:   1) Post op pain control 2) Vent synchrony  P:   -fentanyl gtt -versed gtt, goal to dc this post op closure   I have personally obtained a history, examined the patient, evaluated laboratory and imaging results, formulated the assessment and plan and placed orders.  CRITICAL CARE: The patient is critically ill with multiple organ systems failure and requires high complexity decision making for assessment and support, frequent evaluation and titration of therapies,  application of advanced monitoring technologies and extensive interpretation of multiple databases. Critical Care Time devoted to patient care services described in this note is 30 minutes.   Levy Pupa, MD, PhD 12/25/2012, 10:13 AM [facility] [phone] or if no answer [phone]

## 2012-12-25 NOTE — Evaluation (Signed)
Physical Therapy Evaluation Patient Details Name: Erin Schultz MRN: [id number] DOB: Feb 10, 1966 Today's Date: 12/25/2012 Time: 4098-1191 PT Time Calculation (min): 35 min  PT Assessment / Plan / Recommendation Clinical Impression  Pt s/p Sepsis after abdominal wound secondary to incarcerated hernia, VDRF, wound vac.  will benefit from PT to address mobility and balance issues prior to d/c home.  Will benefit from possible rehab prior to d/c home.  MD:  Please order Rehab c/s if you agree.      PT Assessment  Patient needs continued PT services    Follow Up Recommendations  CIR;Supervision/Assistance - 24 hour                Equipment Recommendations  Rolling walker with 5" wheels    Recommendations for Other Services Rehab consult   Frequency Min 3X/week    Precautions / Restrictions Precautions Precautions: Fall Restrictions Weight Bearing Restrictions: No   Pertinent Vitals/Pain VSS, Incr pain abdomen      Mobility  Bed Mobility Bed Mobility: Rolling Right;Right Sidelying to Sit;Sitting - Scoot to [place] of Bed Rolling Right: Other (comment) (Tot A +3 (pt =50%)) Right Sidelying to Sit: Other (comment) (Tot A+3 (pt =50%)) Sitting - Scoot to Edge of Bed: 2: Max assist Details for Bed Mobility Assistance: Pt needed assist for elevation of trunk as well as to bring LEs off of bed.  Pt unable to assist as much due to abdominal pain.  Needs cues for technique. Transfers Transfers: Not assessed Ambulation/Gait Ambulation/Gait Assistance: Not tested (comment) Stairs: No Wheelchair Mobility Wheelchair Mobility: No    Exercises General Exercises - Lower Extremity Long Arc Quad: AROM;Both;10 reps;Seated Hip Flexion/Marching: AROM;Both;10 reps;Seated   PT Diagnosis: Generalized weakness  PT Problem List: Decreased activity tolerance;Decreased balance;Decreased mobility;Decreased knowledge of precautions;Decreased safety awareness;Decreased knowledge of use of  DME;Obesity;Pain PT Treatment Interventions: DME instruction;Gait training;Functional mobility training;Therapeutic activities;Stair training;Therapeutic exercise;Balance training;Patient/family education   PT Goals Acute Rehab PT Goals PT Goal Formulation: With patient Time For Goal Achievement: 01/08/13 Potential to Achieve Goals: Good Pt will go Supine/Side to Sit: Independently PT Goal: Supine/Side to Sit - Progress: Goal set today Pt will Sit at Edge of Bed: Independently;6-10 min PT Goal: Sit at Edge Of Bed - Progress: Goal set today Pt will go Sit to Stand: with supervision;with upper extremity assist PT Goal: Sit to Stand - Progress: Goal set today Pt will Transfer Bed to Chair/Chair to Bed: with supervision PT Transfer Goal: Bed to Chair/Chair to Bed - Progress: Goal set today Pt will Ambulate: 51 - 150 feet;with least restrictive assistive device;with min assist PT Goal: Ambulate - Progress: Goal set today Pt will Go Up / Down Stairs: 3-5 stairs;with min assist;with least restrictive assistive device PT Goal: Up/Down Stairs - Progress: Goal set today  Visit Information  Last PT Received On: 12/25/12 Assistance Needed: +3 or more    Subjective Data  Subjective: "I feel better." Patient Stated Goal: To be independent   Prior Functioning  Home Living Lives With: Family Available Help at Discharge: Family;Available 24 hours/day Type of Home: House Home Access: Stairs to enter [employer] of Steps: 4 Entrance Stairs-Rails: Left Home Layout: One level Bathroom Shower/Tub: Engineer, manufacturing systems: Standard Home Adaptive Equipment: None Prior Function Level of Independence: Independent Able to Take Stairs?: Yes Driving: Yes Vocation: Full time employment Comments: Works at [employer]: No difficulties    Cognition  Cognition Overall Cognitive Status: Appears within functional limits for tasks  assessed/performed Arousal/Alertness:  Awake/alert Orientation Level: Appears intact for tasks assessed Behavior During Session: [facility] for tasks performed    Extremity/Trunk Assessment Right Lower Extremity Assessment RLE ROM/Strength/Tone: [facility] for tasks assessed Left Lower Extremity Assessment LLE ROM/Strength/Tone: [facility] for tasks assessed Trunk Assessment Trunk Assessment: Normal   Balance Balance Balance Assessed: Yes Static Sitting Balance Static Sitting - Balance Support: Bilateral upper extremity supported;Feet supported Static Sitting - Level of Assistance: 3: Mod assist Static Sitting - Comment/# of Minutes: Pt requiring mod assist to sit EOB as she could not maintain sitting position without constant mod assist at EOB due to abdominal pain limiting ability to maintain sitting.  Pt leaning posteriorly.  Was able with stabilizing of trunk to perform a few LE exercises at EOB in sitting.    End of Session PT - End of Session Equipment Utilized During Treatment: Oxygen Activity Tolerance: Patient limited by fatigue;Patient limited by pain Patient left: in bed;with call bell/phone within reach;with nursing in room;with family/visitor present Nurse Communication: Mobility status;Need for lift equipment       INGOLD,Jefferey Lippmann 12/25/2012, 2:02 PM  [facility] Acute Rehabilitation [phone] (pager)

## 2012-12-26 ENCOUNTER — Encounter (HOSPITAL_COMMUNITY): Payer: Self-pay | Admitting: Physical Medicine and Rehabilitation

## 2012-12-26 ENCOUNTER — Inpatient Hospital Stay (HOSPITAL_COMMUNITY): Payer: BC Managed Care – PPO

## 2012-12-26 DIAGNOSIS — A4189 Other specified sepsis: Secondary | ICD-10-CM

## 2012-12-26 DIAGNOSIS — R5381 Other malaise: Secondary | ICD-10-CM

## 2012-12-26 LAB — MAGNESIUM: Magnesium: 2.1 mg/dL (ref 1.5–2.5)

## 2012-12-26 LAB — GLUCOSE, CAPILLARY
Glucose-Capillary: 186 mg/dL — ABNORMAL HIGH (ref 70–99)
Glucose-Capillary: 208 mg/dL — ABNORMAL HIGH (ref 70–99)
Glucose-Capillary: 196 mg/dL — ABNORMAL HIGH (ref 70–99)
Glucose-Capillary: 169 mg/dL — ABNORMAL HIGH (ref 70–99)

## 2012-12-26 LAB — PHOSPHORUS: Phosphorus: 3.3 mg/dL (ref 2.3–4.6)

## 2012-12-26 LAB — CBC WITH DIFFERENTIAL/PLATELET
Basophils Absolute: 0.1 10*3/uL (ref 0.0–0.1)
Basophils Relative: 1 % (ref 0–1)
HCT: 26.5 % — ABNORMAL LOW (ref 36.0–46.0)
Lymphocytes Relative: 12 % (ref 12–46)
Monocytes Absolute: 1.4 10*3/uL — ABNORMAL HIGH (ref 0.1–1.0)
Neutro Abs: 16.9 10*3/uL — ABNORMAL HIGH (ref 1.7–7.7)
Neutrophils Relative %: 79 % — ABNORMAL HIGH (ref 43–77)
RDW: 13.6 % (ref 11.5–15.5)
WBC: 21.4 10*3/uL — ABNORMAL HIGH (ref 4.0–10.5)

## 2012-12-26 LAB — BASIC METABOLIC PANEL
CO2: 28 mEq/L (ref 19–32)
Chloride: 104 mEq/L (ref 96–112)
Glucose, Bld: 218 mg/dL — ABNORMAL HIGH (ref 70–99)
Sodium: 140 mEq/L (ref 135–145)

## 2012-12-26 MED ORDER — INSULIN GLARGINE 100 UNIT/ML ~~LOC~~ SOLN
15.0000 [IU] | Freq: Every day | SUBCUTANEOUS | Status: DC
  Administered 2012-12-26 – 2012-12-30 (×5): 15 [IU] via SUBCUTANEOUS

## 2012-12-26 MED ORDER — INSULIN ASPART 100 UNIT/ML ~~LOC~~ SOLN
0.0000 [IU] | Freq: Three times a day (TID) | SUBCUTANEOUS | Status: DC
  Administered 2012-12-26 (×2): 4 [IU] via SUBCUTANEOUS
  Administered 2012-12-27: 3 [IU] via SUBCUTANEOUS
  Administered 2012-12-27 (×2): 4 [IU] via SUBCUTANEOUS
  Administered 2012-12-28: 3 [IU] via SUBCUTANEOUS
  Administered 2012-12-28: 4 [IU] via SUBCUTANEOUS
  Administered 2012-12-30: 3 [IU] via SUBCUTANEOUS

## 2012-12-26 MED ORDER — FAT EMULSION 20 % IV EMUL
250.0000 mL | INTRAVENOUS | Status: AC
  Administered 2012-12-26: 250 mL via INTRAVENOUS
  Filled 2012-12-26: qty 250

## 2012-12-26 MED ORDER — INSULIN ASPART 100 UNIT/ML ~~LOC~~ SOLN
0.0000 [IU] | SUBCUTANEOUS | Status: DC
  Administered 2012-12-26: 7 [IU] via SUBCUTANEOUS

## 2012-12-26 MED ORDER — TRACE MINERALS CR-CU-F-FE-I-MN-MO-SE-ZN IV SOLN
INTRAVENOUS | Status: AC
  Administered 2012-12-26: 17:00:00 via INTRAVENOUS
  Filled 2012-12-26: qty 2400

## 2012-12-26 MED ORDER — INSULIN ASPART 100 UNIT/ML ~~LOC~~ SOLN: 0.0000 [IU] | Freq: Every day | SUBCUTANEOUS | Status: DC

## 2012-12-26 NOTE — Progress Notes (Signed)
PARENTERAL NUTRITION CONSULT NOTE - Follow-up  Pharmacy Consult for TPN Indication: Open abdomen s/p hernia repair, small bowel resection; unable to feed enterally  Allergies  Allergen Reactions  . Corn-Containing Products Nausea And Vomiting    Stomach inflamed  . Sulfa Antibiotics     Childhood reaction    Patient Measurements: Height: 5\' 7"  (170.2 cm) Weight: 316 lb 9.3 oz (143.6 kg) IBW/kg (Calculated) : 61.6 Adjusted weight: 68 Kg  Vital Signs: Temp: 98.4 F (36.9 C) (02/14 0752) Temp src: Oral (02/14 0752) BP: 121/61 mmHg (02/14 0800) Pulse Rate: 90 (02/14 0800) Intake/Output from previous day: 02/13 0701 - 02/14 0700 In: 3486.8 [I.V.:701.8; NG/GT:90; IV Piggyback:125; TPN:2570] Out: 2225 [Urine:1850; Emesis/NG output:200; Drains:175] Intake/Output from this shift: Total I/O In: 162.5 [I.V.:20; NG/GT:30; IV Piggyback:12.5; TPN:100] Out: 100 [Urine:80; Emesis/NG output:20]  Labs:  Recent Labs  12/24/12 0426 12/25/12 0400 12/26/12 0410  WBC 19.5* 24.7* 21.4*  HGB 9.7* 9.4* 8.5*  HCT 31.5* 30.1* 26.5*  PLT 409* 433* 449*     Recent Labs  12/24/12 0426 12/25/12 0400 12/26/12 0410  NA 141 139 140  K 3.4* 4.1 4.0  CL 99 101 104  CO2 31 29 28   GLUCOSE 261* 140* 218*  BUN 27* 36* 30*  CREATININE 1.12* 1.30* 1.02  CALCIUM 9.3 9.3 9.5  MG 1.7 1.7 2.1  PHOS 3.5 3.6 3.3  PROT  --  6.4  --   ALBUMIN  --  2.1*  --   AST  --  42*  --   ALT  --  34  --   ALKPHOS  --  99  --   BILITOT  --  0.2*  --    Estimated Creatinine Clearance: 101.6 ml/min (by C-G formula based on Cr of 1.02).    Recent Labs  12/26/12 0016 12/26/12 0414 12/26/12 0755  GLUCAP 184* 186* 208*   Insulin Requirements in the past 24 hours:  Lantus 10 units + insulin R 40 units in TPN  Nutrition Needs: (new per RD note 2/10) Kcals: 2250 Protein 125-135 g  Current Nutrition:  Clinimix E 5/15 at 128ml/hr (120 gm protein/day and avg of 1920 Kcal/d)  Assessment: 4 yof  admitted 2/3 with an incarcerated abdominal wall hernia and perforated small bowel. Emergently went to the OR for ex-lap, LOA, perforation repair and bowel resection. Back to OR on 2/5 for exploration, unable to close fascia due to edema. TPN initiated 2/8 due to anticipated prolonged NPO status.  GI: Open abdomen s/p perf repair, small bowel resection. 2/10: Partial closure abdominal fascia with a VAC change, unable to close abd. 2/12 OR for closure of abdomen and vac placement. NG 200, Drains 175 out, both down from yesterday.  Endo: Hx DM (A1c 8.8) - CBGs trending up after attempt to transition off of insulin drip with Lantus + insulin in TPN.  No SSI ordered.  LytesJudieth Keens are ok.   Renal: Hx CKD. S/p CRRT for oliguric ARF (CRRT stopped 2/5). Scr 1, UOP ok at 0.5 ml/kg/hr.  Pulm: 3L [place]  Cards: Hx HTN -VSS off pressors,(PTA lotrel, triam/hctz)  Hepatobil: Tbili WNL. AST slightly elevated 42. Trig 180 to 198, TC 131 to 142. Prealbumin low at baseline, expected with post-op status and ongoing inflammation. Would expect slow progress.  ID: Zosyn empiric for peritonitis. Completed micafungin. Febrile 100.4, WBC elevated but stable, but pt also post-op  Best Practices: heparin SQ, PPI IV, MC  TPN Access: CVC  TPN day#: 6  Plan:  -  Continue Clinimix E 5/15 at 182ml/hr -would hold off on advancing TPN further d/t high insulin requirement. - Will increase Lantus to 15 units daily, start SSI coverage q4h, and increase insulin in TPN to 50 units/bag. - Lipids + MVI + TE on MWF only d/t national shortage - Will f/up labs and glycemia in AM  Estella Husk, Pharm.D., BCPS Clinical Pharmacist Phone: [phone] or [phone] Pager: [phone] 12/26/2012, 8:46 AM

## 2012-12-26 NOTE — Progress Notes (Signed)
I await OT eval and medical progress so that I can seek insurance approval for admission to inpt rehab when medically ready. I will follow up on Monday. [phone]

## 2012-12-26 NOTE — Progress Notes (Signed)
Family Practice Teaching Service Social Note   Erin Schultz is a 47 y.o. year old female presenting with PMH of HTN, and DM2, here with an incarcerated hernia leading to SBO and perforation s/p resection and adhesion lysis on 2/4 who has now been extubated and weaned off pressors. She had septic ATN requiring CRRT which is now stopped due to improvement in renal function. She is ordered for BiPap QHS currently and surgery closed her wound with biologic mesh on on 12/24/2012; still has wound vac in place.   She is currently still ICU status and being actively managed by CCM- we appreciate their excellent care. We will continue to follow along and assume primary care when she is no longer ICU status. Please page our service [phone]) when this transition takes place and we should assume primary care.  Levert Feinstein, MD Family Medicine PGY-1 Service Pager [phone]

## 2012-12-26 NOTE — Progress Notes (Addendum)
PULMONARY  / CRITICAL CARE MEDICINE  Name: Erin Schultz MRN: [id number] DOB: Sep 15, 1966    ADMISSION DATE:  12/15/2012 CONSULTATION DATE:  12/16/12  REFERRING MD :  Donell Beers (CCS)  CHIEF COMPLAINT:  Abdominal pain  BRIEF PATIENT DESCRIPTION: 47 y/o morbidly obese female was admitted on 12/15/12 with an incarcerated abdominal wall hernia and perforated small bowel requiring emergent surgery.  PCCM consulted for post op vent management.  SIGNIFICANT EVENTS / STUDIES:  2/3 - CT abdomen>> large anterior abdominal wall hernia, perforation, and complex fluid collection deep to the bowel; small L kidney, likely poorly functioning R kidney given lack of contrast.  Fatty infiltration of liver 2/3 -  ex-lap, lysis of adhesions, perforation repair, 25cm of bowel resection 2/4 - ARF, limited urine output 2/5 - OR for reexploration - No evidence of infection, ischemia, or leak. Unable to close the fascia due to edema.  2/6 - neg balance, hd cath removed 2/8 - TNA started, net neg 3600 L 2/9 - stable, VAC remains in place 2/12 - closed wound with biologic mesh, still with wound vac   LINES / TUBES: 2/3 ETT >> 2/13 2/3 R IJ CVL >> 2/10 2/10 L IJ CVC >>  2/3 radial a-line >> out 2/4 left ij HD>>>2/6  CULTURES: 2/4 blood >>neg 2/3 urine >>contam  ANTIBIOTICS: 2/4 Ertapenem >>2/4 2/4 Micafungin>>>2/8 2/4 vancomycin>>>2/6 2/4 Imipenem>>>2/7 2/7 zosyn>>>  SUBJECTIVE: Looks good s/p extubation 2/13 BiPAP was brought to the room, but she was never placed on it last night Underwent vac change at bedside 2/14 About to get up with PT Probably d/c GT and start clears 2/14  VITAL SIGNS: Temp:  [97.6 F (36.4 C)-98.6 F (37 C)] 98.4 F (36.9 C) (02/14 0752) Pulse Rate:  [87-149] 87 (02/14 0900) Resp:  [21-45] 45 (02/14 0900) BP: (97-121)/(42-72) 110/67 mmHg (02/14 0900) SpO2:  [92 %-100 %] 94 % (02/14 0900) FiO2 (%):  [40 %] 40 % (02/13 1108)  HEMODYNAMICS:    VENTILATOR SETTINGS: Vent  Mode:  [-] PSV FiO2 (%):  [40 %] 40 % PEEP:  [5 cmH20] 5 cmH20 Pressure Support:  [5 cmH20] 5 cmH20  INTAKE / OUTPUT: Intake/Output     02/13 0701 - 02/14 0700 02/14 0701 - 02/15 0700   I.V. (mL/kg) 701.8 (4.9) 20 (0.1)   NG/GT 90 30   IV Piggyback 125 12.5   TPN 2570 100   Total Intake(mL/kg) 3486.8 (24.3) 162.5 (1.1)   Urine (mL/kg/hr) 1850 (0.5) 180 (0.4)   Emesis/NG output 200 (0.1) 20 (0)   Drains 175 (0.1)    Total Output 2225 200   Net +1261.8 -37.5        Stool Occurrence 1 x      PHYSICAL EXAMINATION:  Gen: Morbidly obese, comfortable breathing patern HEENT:  OP moist, no stridor or secretions PULM: diminished in bases but otherwise clear CV: regular, no mgr, no JVD AB: hypoactive bowel sounds, anterior wound with wound vac in place Ext: 1+ LE edema Derm: wound vac Neuro: awake, oriented   LABS:  Recent Labs Lab 12/24/12 0426 12/25/12 0400 12/26/12 0410  HGB 9.7* 9.4* 8.5*  HCT 31.5* 30.1* 26.5*  WBC 19.5* 24.7* 21.4*  PLT 409* 433* 449*    Recent Labs Lab 12/22/12 0430 12/23/12 0400 12/24/12 0426 12/25/12 0400 12/26/12 0410  NA 143 139 141 139 140  K 3.4* 3.5 3.4* 4.1 4.0  CL 98 98 99 101 104  CO2 36* 32 31 29 28   GLUCOSE 187* 270* 261*  140* 218*  BUN 17 18 27* 36* 30*  CREATININE 1.00 1.03 1.12* 1.30* 1.02  CALCIUM 9.8 8.9 9.3 9.3 9.5  MG 1.7 1.6 1.7 1.7 2.1  PHOS 3.4 3.5 3.5 3.6 3.3    Recent Labs Lab 12/19/12 1404 12/20/12 0610  PHART 7.408 7.419  PCO2ART 39.7 45.5*  PO2ART 85.8 100.0  HCO3 24.5* 29.0*  TCO2 25.7 30.4  O2SAT 96.4 97.7    ASSESSMENT / PLAN:  Principal Problem:   Septic shock(785.52) Active Problems:   Small bowel obstruction   Acute respiratory failure with hypoxia   Hernia, umbilical, with gangrene   Acute peritonitis   Bowel perforation   Severe sepsis(995.92)   Protein-calorie malnutrition, moderate   PULMONARY A: 1) Post op respiratory failure - B effusions P:   - follow CXR - allow neg  balance to improve O2 needs, restart low dose lasix 2/14 (have been adjusting based on S Cr) - consider therapeutic thora if we believe this will allow her to progress more quickly - BiPAP brought to room 2/13 but she was never placed on it; will confirm w RT that she will be on qhs - will need outpt sleep study  CARDIOVASCULAR A:  1) Shock, presumably septic -  RESOLVED.  P:  - underlying septic source addressed  RENAL A:   1) Acute renal failure-  P:   - F/u Chem - goal I=O >> restarted lasix 2/14 - Monitor urine output  GASTROINTESTINAL A:   1) Abdominal hernia, bowel strangulation and perforation now s/p exlap, partial small bowel resection P:   - ppi - NPO >> goal clears on 2/14 - cont TPN until good PO  - will require wound vac changes but likely no more trips to the OR as of 2/13  HEMATOLOGIC A:   1) Mild anemia P:  - sub q hep - follow CBC  INFECTIOUS A:   1) Septic shock from peritonitis, s/p surgical repair and cleanout  P:   - remains culture neg - continue zosyn, day 11 total abx on 2/14; recommend 14 days total then d/c   ENDOCRINE A:  1) DM2 P:   - ICU hyperglycemia protocol >> transition to standard HG protocol 2/14 - lantus added 2/11 pm  NEUROLOGIC A:   1) Post op pain control 2) Vent synchrony  P:   -prn narcs for dressing changes  I have personally obtained a history, examined the patient, evaluated laboratory and imaging results, formulated the assessment and plan and placed orders.   Levy Pupa, MD, PhD 12/26/2012, 10:25 AM [facility] [phone] or if no answer [phone]

## 2012-12-26 NOTE — Progress Notes (Signed)
Physical Therapy Treatment Patient Details Name: Erin Schultz MRN: [id number] DOB: [birth year] Today's Date: 12/26/2012 Time: 5621-3086 PT Time Calculation (min): 32 min  PT Assessment / Plan / Recommendation Comments on Treatment Session  Pt s/p sepsis and VDRF with abdominal wound with decr mobility secondary to decr endurance and decr balance.  Will benefit from PT to conitnue to address mobility issues.  Did ambulate today a few feet with RW.      Follow Up Recommendations  CIR;Supervision/Assistance - 24 hour                 Equipment Recommendations  Rolling walker with 5" wheels;Other (comment) (will need to be a wide RW)    Recommendations for Other Services Rehab consult  Frequency Min 3X/week   Plan Discharge plan remains appropriate;Frequency remains appropriate    Precautions / Restrictions Precautions Precautions: Fall Restrictions Weight Bearing Restrictions: No   Pertinent Vitals/Pain VSS, Some pain    Mobility  Bed Mobility Bed Mobility: Rolling Left;Left Sidelying to Sit;Sitting - Scoot to Edge of Bed Rolling Right: Not tested (comment) Rolling Left: 1: +2 Total assist Rolling Left: Patient Percentage: 50% Left Sidelying to Sit: 1: +2 Total assist;HOB elevated;With rails Left Sidelying to Sit: Patient Percentage: 50% Sitting - Scoot to Edge of Bed: 3: Mod assist Details for Bed Mobility Assistance: Pt needed 2 person assist to get to EOB as pt had a lot of difficulty rolling secondary to body habitus and pain.  Needed cues for technique and sequencing.  Took incr time for pt to get her hips to EOB secondary to pain as well as DOE 3/4.  Used pad to asssit pt to EOB.   Transfers Transfers: Sit to Stand;Stand to Sit Sit to Stand: 1: +2 Total assist;With upper extremity assist;From elevated surface;From bed Sit to Stand: Patient Percentage: 70% Stand to Sit: 1: +2 Total assist;With upper extremity assist;To elevated surface;To chair/3-in-1;With  armrests Stand to Sit: Patient Percentage: 70% Details for Transfer Assistance: Pt needed cues for hand placement.  Needed assist for anterior translation of trunk and pelvis as well.   Ambulation/Gait Ambulation/Gait Assistance: 1: +2 Total assist Ambulation/Gait: Patient Percentage: 70% Ambulation Distance (Feet): 5 Feet Assistive device: Rolling walker Ambulation/Gait Assistance Details: Pt ambulated 5 feet with wide RW with slight unsteadiness on her feet.  Needed cues to sequence steps and assist to move RW.  DOE 3/4 with cues for pursed lip breathing.  Gait Pattern: Step-to pattern;Decreased stride length;Shuffle Gait velocity: decreased Stairs: No Wheelchair Mobility Wheelchair Mobility: No     PT Goals Acute Rehab PT Goals PT Goal: Supine/Side to Sit - Progress: Progressing toward goal PT Goal: Sit at Edge Of Bed - Progress: Progressing toward goal PT Goal: Sit to Stand - Progress: Progressing toward goal PT Transfer Goal: Bed to Chair/Chair to Bed - Progress: Progressing toward goal PT Goal: Ambulate - Progress: Progressing toward goal  Visit Information  Last PT Received On: 12/26/12 Assistance Needed: +2    Subjective Data  Subjective: "I am ready to get up."   Cognition  Cognition Overall Cognitive Status: Appears within functional limits for tasks assessed/performed Arousal/Alertness: Awake/alert Orientation Level: Appears intact for tasks assessed Behavior During Session: [facility] for tasks performed    Balance  Static Sitting Balance Static Sitting - Balance Support: Bilateral upper extremity supported;Feet supported Static Sitting - Level of Assistance: 4: Min assist Static Sitting - Comment/# of Minutes: STill requiring min assist at EOB secondary to continued abdominal pain limiting ability for pt  to sit fully upright.  Leans posteriorly.   Static Standing Balance Static Standing - Balance Support: Bilateral upper extremity supported;During functional  activity Static Standing - Level of Assistance: 4: Min assist Static Standing - Comment/# of Minutes: Needed steadying assist with RW secondary to knee instability  End of Session PT - End of Session Equipment Utilized During Treatment: Gait belt;Oxygen Activity Tolerance: Patient limited by fatigue;Patient limited by pain Patient left: in chair;with call bell/phone within reach Nurse Communication: Mobility status        INGOLD,Alaena Strader 12/26/2012, 1:51 PM [facility] Acute Rehabilitation [phone] (pager)

## 2012-12-26 NOTE — Progress Notes (Signed)
NUTRITION FOLLOW UP  Intervention:    TPN per pharmacy RD to follow for nutrition care plan  Nutrition Dx:   Inadequate oral intake related to altered GI function as evidenced by NPO status, ongoing  Goal:   TPN to meet >90% of estimated protein needs, maximize energy provision as able during national lipid backorder, met  Monitor:   TPN prescription, respiratory status, weight, labs, I/O's  Assessment:   Patient s/p procedures 2/4:  EXPLORATORY LAPAROTOMY  LYSIS OF ADHESIONS  SMALL BOWEL RESECTION  PLACEMENT OF ABDOMINAL WOUND VAC  Patient extubated 2/13.  S/p partial closure abdominal fascia with VAC change 2/10, closure 2/12.  Patient is receiving TPN with Clinimix E 5/15 @ 100 ml/hr. Lipids (20% IVFE @ 10 ml/hr), multivitamins, and trace elements are provided 3 times weekly (MWF) due to national backorder. Provides 1910 kcal and 120 grams protein daily (based on weekly average). Meets 91% minimum estimated kcal and 96% minimum estimated protein needs.  Height: Ht Readings from Last 1 Encounters:  12/16/12 5\' 7"  (1.702 m)    Weight Status:   Wt Readings from Last 1 Encounters:  12/24/12 316 lb 9.3 oz (143.6 kg)    Re-estimated needs:  Kcal: 2100-2300 Protein: 125-135 gm Fluid: 2.1-2.3 L  Skin: abdominal wound  Diet Order: NPO   Intake/Output Summary (Last 24 hours) at 12/26/12 1106 Last data filed at 12/26/12 0900  Gross per 24 hour  Intake 2810.1 ml  Output   1950 ml  Net  860.1 ml    Labs:   Recent Labs Lab 12/24/12 0426 12/25/12 0400 12/26/12 0410  NA 141 139 140  K 3.4* 4.1 4.0  CL 99 101 104  CO2 31 29 28   BUN 27* 36* 30*  CREATININE 1.12* 1.30* 1.02  CALCIUM 9.3 9.3 9.5  MG 1.7 1.7 2.1  PHOS 3.5 3.6 3.3  GLUCOSE 261* 140* 218*    CBG (last 3)   Recent Labs  12/26/12 0016 12/26/12 0414 12/26/12 0755  GLUCAP 184* 186* 208*    Scheduled Meds: . heparin  5,000 Units Subcutaneous Q8H  . insulin aspart  0-20 Units Subcutaneous  TID WC  . insulin aspart  0-5 Units Subcutaneous QHS  . insulin glargine  15 Units Subcutaneous Daily  . pantoprazole (PROTONIX) IV  40 mg Intravenous Daily  . piperacillin-tazobactam (ZOSYN)  IV  3.375 g Intravenous Q8H  . sodium chloride  250 mL Intravenous Once    Continuous Infusions: . sodium chloride 20 mL/hr at 12/25/12 2358  . TPN (CLINIMIX) +/- additives     And  . fat emulsion    . TPN [facility]) +/- additives 100 mL/hr at 12/25/12 1709    Maureen Chatters, RD, LDN Pager #: [phone] After-Hours Pager #: [phone]

## 2012-12-26 NOTE — Consult Note (Signed)
Physical Medicine and Rehabilitation Consult Reason for Consult: Deconditioning Referring Physician:  CCS.   HPI: Erin Schultz is a 47 y.o. female with history of DM, morbid obesity; who was admitted on 12/15/12 with 6 day history of worsening abdominal pain with nausea and vomiting due to incarcerated hernia with perforated bowel. She was taken to OR emergently for exploratory lap with lysis of adhesions, small bowel resection and placement of VAC. She was started on IV antibiotics and required pressors due to septic shock from peritonitis. Oliguric renal failure treated with CRRT.  Was taken back to OR multiple times  for VAC change. Closure with mesh and VAC on 02/12 by Dr. Ezzard Standing. She was extubated on 02/13 and to be started on clears today. PT evaluation done and working on pre-gait activities. Reports sat up for 45 minutes today. Patient limited by pain, body habitus and is deconditioning. MD, PT, CM recommending CIR.    Review of Systems  HENT: Negative for hearing loss and neck pain.   Eyes: Negative for blurred vision and double vision.  Respiratory: Negative for cough, shortness of breath and wheezing.   Cardiovascular: Negative for chest pain and palpitations.  Gastrointestinal: Positive for heartburn and abdominal pain. Negative for diarrhea and constipation.  Genitourinary: Negative for urgency and frequency.  Musculoskeletal: Negative for myalgias, back pain and joint pain.  Neurological: Positive for weakness. Negative for sensory change, speech change and headaches.  Psychiatric/Behavioral: Negative for substance abuse. The patient is not nervous/anxious and does not have insomnia.    Past Medical History  Diagnosis Date  . Hypertension   . Diabetes mellitus without complication   . Chronic kidney disease   . H/O hiatal hernia    Past Surgical History  Procedure Laterality Date  . Cholecystectomy    . Appendectomy    . Abdominal hysterectomy    . Laparotomy  12/15/2012     Procedure: EXPLORATORY LAPAROTOMY;  Surgeon: Almond Lint, MD;  Location: [facility] OR;  Service: General;  Laterality: N/A;  . Vacuum assisted closure change  12/17/2012    Procedure: ABDOMINAL VACUUM ASSISTED CLOSURE CHANGE;  Surgeon: Ernestene Mention, MD;  Location: [facility] OR;  Service: General;  Laterality: N/A;  EX Lap with vac change  . Laparotomy N/A 12/19/2012    Procedure: EXPLORATORY LAPAROTOMY;  Surgeon: Ernestene Mention, MD;  Location: [facility] OR;  Service: General;  Laterality: N/A;  . Vacuum assisted closure change N/A 12/19/2012    Procedure: ABDOMINAL VACUUM ASSISTED CLOSURE CHANGE;  Surgeon: Ernestene Mention, MD;  Location: [facility] OR;  Service: General;  Laterality: N/A;  . Vacuum assisted closure change N/A 12/22/2012    Procedure: ABDOMINAL VACUUM ASSISTED CLOSURE CHANGE;  Surgeon: Kandis Cocking, MD;  Location: [facility] OR;  Service: General;  Laterality: N/A;   Family History  Problem Relation Age of Onset  . Diabetes Father   . Diabetes Brother   . Hypertension Father     Social History: Lives with sister. 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  Independent without AD. She reports that she has never smoked. She does not have any smokeless tobacco history on file. She reports that she does not drink alcohol or use illicit drugs.  Allergies  Allergen Reactions  . Corn-Containing Products Nausea And Vomiting    Stomach inflamed  . Sulfa Antibiotics     Childhood reaction   Medications Prior to Admission  Medication Sig Dispense Refill  . amLODipine-benazepril (LOTREL) 5-40 MG per  capsule Take 1 capsule by mouth daily.      . metFORMIN (GLUCOPHAGE) 1000 MG tablet TAKE 1 TABLET BY MOUTH TWICE A DAY WITH MEALS  60 tablet  5  . Multiple Vitamin (MULTIVITAMIN WITH MINERALS) TABS Take 1 tablet by mouth daily.      Marland Kitchen triamterene-hydrochlorothiazide (MAXZIDE-25) 37.5-25 MG per tablet Take 1 tablet by mouth daily.      . fexofenadine (ALLEGRA) 180 MG tablet Take  180 mg by mouth daily as needed. For seasonal allergies        Home: Home Living Lives With: Family Available Help at Discharge: Family;Available 24 hours/day Type of Home: House Home Access: Stairs to enter [employer] of Steps: 4 Entrance Stairs-Rails: Left Home Layout: One level Bathroom Shower/Tub: Engineer, manufacturing systems: Standard Home Adaptive Equipment: None  Functional History: Prior Function Able to Take Stairs?: Yes Driving: Yes Vocation: Full time employment Comments: Works at [employer] Functional Status:  Mobility: Bed Mobility Bed Mobility: Rolling Right;Rolling Left Rolling Right: 1: +2 Total assist;With rail Rolling Right: Patient Percentage: 30% Right Sidelying to Sit: 1: +2 Total assist;With rails;HOB elevated Right Sidelying to Sit: Patient Percentage: 30% Sitting - Scoot to [place] of Bed: 2: Max assist Transfers Transfers: Not assessed Ambulation/Gait Ambulation/Gait Assistance: Not tested (comment) Stairs: No Wheelchair Mobility Wheelchair Mobility: No  ADL:    Cognition: Cognition Arousal/Alertness: Awake/alert Orientation Level: Oriented X4 Cognition Overall Cognitive Status: Appears within functional limits for tasks assessed/performed Arousal/Alertness: Awake/alert Orientation Level: Appears intact for tasks assessed Behavior During Session: [facility] for tasks performed  Blood pressure 102/57, pulse 89, temperature 98.4 F (36.9 C), temperature source Oral, resp. rate 34, height 5\' 7"  (1.702 m), weight 143.6 kg (316 lb 9.3 oz), SpO2 92.00%. Physical Exam  Nursing note and vitals reviewed. Constitutional: She is oriented to person, place, and time. She appears well-developed and well-nourished.  Obese   HENT:  Head: Normocephalic and atraumatic.  Eyes: Pupils are equal, round, and reactive to light.  Neck: Normal range of motion. Neck supple. No tracheal deviation present. No thyromegaly present.   Cardiovascular: Normal rate and regular rhythm.   Pulmonary/Chest: Effort normal. She has decreased breath sounds in the right lower field and the left lower field.  Abdominal: Soft. She exhibits no distension. There is tenderness.  VAC in place on midline incision. Pain in abdomen with proximal leg movement and trunk movement  Musculoskeletal: She exhibits edema (pedal edema bilaterally. ).  Neurological: She is alert and oriented to person, place, and time. No cranial nerve deficit. She exhibits abnormal muscle tone. Coordination normal.  Pleasant and appropriate. Follows commands without difficulty. Strength 5/5 UE. 3/5 prox LE and 4/5 distally  Skin: Skin is warm and dry.  Psychiatric: She has a normal mood and affect. Her speech is normal and behavior is normal. Judgment and thought content normal. Cognition and memory are normal.    Results for orders placed during the hospital encounter of 12/15/12 (from the past 24 hour(s))  GLUCOSE, CAPILLARY     Status: Abnormal   Collection Time    12/25/12  4:02 PM      Result Value Range   Glucose-Capillary 140 (*) 70 - 99 mg/dL   Comment 1 Notify RN    GLUCOSE, CAPILLARY     Status: Abnormal   Collection Time    12/25/12  7:24 PM      Result Value Range   Glucose-Capillary 186 (*) 70 - 99 mg/dL   Comment 1 Notify  RN    GLUCOSE, CAPILLARY     Status: Abnormal   Collection Time    12/26/12 12:16 AM      Result Value Range   Glucose-Capillary 184 (*) 70 - 99 mg/dL  CBC WITH DIFFERENTIAL     Status: Abnormal   Collection Time    12/26/12  4:10 AM      Result Value Range   WBC 21.4 (*) 4.0 - 10.5 K/uL   RBC 3.01 (*) 3.87 - 5.11 MIL/uL   Hemoglobin 8.5 (*) 12.0 - 15.0 g/dL   HCT 45.4 (*) 09.8 - 11.9 %   MCV 88.0  78.0 - 100.0 fL   MCH 28.2  26.0 - 34.0 pg   MCHC 32.1  30.0 - 36.0 g/dL   RDW 14.7  82.9 - 56.2 %   Platelets 449 (*) 150 - 400 K/uL   Neutrophils Relative 79 (*) 43 - 77 %   Neutro Abs 16.9 (*) 1.7 - 7.7 K/uL    Lymphocytes Relative 12  12 - 46 %   Lymphs Abs 2.7  0.7 - 4.0 K/uL   Monocytes Relative 7  3 - 12 %   Monocytes Absolute 1.4 (*) 0.1 - 1.0 K/uL   Eosinophils Relative 2  0 - 5 %   Eosinophils Absolute 0.3  0.0 - 0.7 K/uL   Basophils Relative 1  0 - 1 %   Basophils Absolute 0.1  0.0 - 0.1 K/uL  PHOSPHORUS     Status: None   Collection Time    12/26/12  4:10 AM      Result Value Range   Phosphorus 3.3  2.3 - 4.6 mg/dL  MAGNESIUM     Status: None   Collection Time    12/26/12  4:10 AM      Result Value Range   Magnesium 2.1  1.5 - 2.5 mg/dL  BASIC METABOLIC PANEL     Status: Abnormal   Collection Time    12/26/12  4:10 AM      Result Value Range   Sodium 140  135 - 145 mEq/L   Potassium 4.0  3.5 - 5.1 mEq/L   Chloride 104  96 - 112 mEq/L   CO2 28  19 - 32 mEq/L   Glucose, Bld 218 (*) 70 - 99 mg/dL   BUN 30 (*) 6 - 23 mg/dL   Creatinine, Ser 1.30  0.50 - 1.10 mg/dL   Calcium 9.5  8.4 - 86.5 mg/dL   GFR calc non Af Amer 64 (*) >90 mL/min   GFR calc Af Amer 75 (*) >90 mL/min  GLUCOSE, CAPILLARY     Status: Abnormal   Collection Time    12/26/12  4:14 AM      Result Value Range   Glucose-Capillary 186 (*) 70 - 99 mg/dL  GLUCOSE, CAPILLARY     Status: Abnormal   Collection Time    12/26/12  7:55 AM      Result Value Range   Glucose-Capillary 208 (*) 70 - 99 mg/dL  GLUCOSE, CAPILLARY     Status: Abnormal   Collection Time    12/26/12 12:23 PM      Result Value Range   Glucose-Capillary 196 (*) 70 - 99 mg/dL   Dg Chest Port 1 View  12/26/2012  *RADIOLOGY REPORT*  Clinical Data: Evaluation of lines.  PORTABLE CHEST - 1 VIEW  Comparison: 12/25/2012.  Findings: Post removal of endotracheal tube.  Tip of left internal jugular venous catheter terminates in  superior vena cava.  Tip of enteric tube extends into the distal stomach area.  Tip is not included on image.  Enlargement of the cardiac silhouette unchanged.  Elevation of right hemidiaphragm with minimal atelectasis of the  right lung base with overall low volume of right lung.  Opacity in the inferior left hemithorax with loss of definition of margin of left hemidiaphragm with left basilar atelectasis and opacity.  There is some improved aeration of the lower portion of the left lung laterally.  There is overall low lung volumes.  Small amounts of pleural fluid cannot be excluded. There is mild vascular congestion pattern accentuated by low lung volumes and semi erect positioning.  IMPRESSION: Post removal of endotracheal tube.  Tip of left internal jugular venous catheter terminates in superior vena cava.  Tip of enteric tube extends into the distal stomach area.  Tip is not included on image.  Enlargement of the cardiac silhouette unchanged. Low lung volumes.  Minimal right basilar atelectasis.  Atelectasis and opacity in the inferior left hemithorax.  Small pleural effusions cannot be excluded.  Vascular congestion pattern accentuated by low lung volumes and semi-erect positioning.  No new lesion evident.   Original Report Authenticated By: Onalee Hua Call    Dg Chest Port 1 View  12/25/2012  *RADIOLOGY REPORT*  Clinical Data: Evaluate ETT, lines  PORTABLE CHEST - 1 VIEW  Comparison: 12/23/2012  Findings: Cardiomegaly with pulmonary vascular congestion and suspected mild interstitial edema.  Layering small bilateral pleural effusions.  No pneumothorax.  Endotracheal tube terminates 4 cm above the carina.  Stable left IJ venous catheter.  Enteric tube likely courses below the diaphragm but is poorly visualized.  IMPRESSION: Endotracheal tube terminates 4 cm above the carina.  Cardiomegaly with pulmonary vascular congestion and suspected mild interstitial edema.  Layering small bilateral pleural effusions.   Original Report Authenticated By: Charline Bills, M.D.     Assessment/Plan: Diagnosis: severe deconditioning after perforated bowel/sbo obstruction with multiple wound issues,sepsis 1. Does the need for close, 24 hr/day  medical supervision in concert with the patient's rehab needs make it unreasonable for this patient to be served in a less intensive setting? Yes 2. Co-Morbidities requiring supervision/potential complications: htn, morbid obesity 3. Due to bladder management, bowel management, safety, skin/wound care, disease management, medication administration, pain management and patient education, does the patient require 24 hr/day rehab nursing? Yes 4. Does the patient require coordinated care of a physician, rehab nurse, PT (1-2 hrs/day, 5 days/week) and OT (1-2 hrs/day, 5 days/week) to address physical and functional deficits in the context of the above medical diagnosis(es)? Yes Addressing deficits in the following areas: balance, endurance, locomotion, strength, transferring, bowel/bladder control, bathing, dressing, feeding, grooming, toileting and psychosocial support 5. Can the patient actively participate in an intensive therapy program of at least 3 hrs of therapy per day at least 5 days per week? Yes 6. The potential for patient to make measurable gains while on inpatient rehab is excellent 7. Anticipated functional outcomes upon discharge from inpatient rehab are mod I with PT, mod I to min assist with OT, n/a with SLP. 8. Estimated rehab length of stay to reach the above functional goals is: 2 weeks 9. Does the patient have adequate social supports to accommodate these discharge functional goals? Yes 10. Anticipated D/C setting: Home 11. Anticipated post D/C treatments: HH therapy 12. Overall Rehab/Functional Prognosis: excellent  RECOMMENDATIONS: This patient's condition is appropriate for continued rehabilitative care in the following setting: CIR Patient has agreed to  participate in recommended program. Yes Note that insurance prior authorization may be required for reimbursement for recommended care.  Comment:Rehab RN to follow up.   Ivory Broad, MD     12/26/2012

## 2012-12-26 NOTE — Progress Notes (Signed)
Patient ID: Erin Schultz, female   DOB: 1966-05-28, 47 y.o.   MRN: [id number] 2 Days Post-Op  Subjective: Pt feels ok.  Extubated.  Passed some flatus yesterday.  Pain with coughing.  Objective: Vital signs in last 24 hours: Temp:  [97.6 F (36.4 C)-98.6 F (37 C)] 98.4 F (36.9 C) (02/14 0752) Pulse Rate:  [88-149] 90 (02/14 0800) Resp:  [21-44] 34 (02/14 0800) BP: (97-127)/(42-72) 121/61 mmHg (02/14 0800) SpO2:  [92 %-100 %] 94 % (02/14 0800) FiO2 (%):  [40 %] 40 % (02/13 1108)    Intake/Output from previous day: 02/13 0701 - 02/14 0700 In: 3486.8 [I.V.:701.8; NG/GT:90; IV Piggyback:125; TPN:2570] Out: 2225 [Urine:1850; Emesis/NG output:200; Drains:175] Intake/Output this shift: Total I/O In: 132.5 [I.V.:20; IV Piggyback:12.5; TPN:100] Out: 100 [Urine:80; Emesis/NG output:20]  PE: Abd: soft, few BS, wound VAC change done by Dr. Ezzard Standing and myself.  We irrigated her wound off with about 900cc NS.  Her wound is clean and her biologic mesh is intact with sutures.  A large VAC sponge was replaced without difficulty.  Lab Results:   Recent Labs  12/25/12 0400 12/26/12 0410  WBC 24.7* 21.4*  HGB 9.4* 8.5*  HCT 30.1* 26.5*  PLT 433* 449*   BMET  Recent Labs  12/25/12 0400 12/26/12 0410  NA 139 140  K 4.1 4.0  CL 101 104  CO2 29 28  GLUCOSE 140* 218*  BUN 36* 30*  CREATININE 1.30* 1.02  CALCIUM 9.3 9.5   PT/INR No results found for this basename: LABPROT, INR,  in the last 72 hours CMP     Component Value Date/Time   NA 140 12/26/2012 0410   K 4.0 12/26/2012 0410   CL 104 12/26/2012 0410   CO2 28 12/26/2012 0410   GLUCOSE 218* 12/26/2012 0410   BUN 30* 12/26/2012 0410   CREATININE 1.02 12/26/2012 0410   CREATININE 0.89 06/03/2012 1546   CALCIUM 9.5 12/26/2012 0410   PROT 6.4 12/25/2012 0400   ALBUMIN 2.1* 12/25/2012 0400   AST 42* 12/25/2012 0400   ALT 34 12/25/2012 0400   ALKPHOS 99 12/25/2012 0400   BILITOT 0.2* 12/25/2012 0400   GFRNONAA 64* 12/26/2012 0410    GFRAA 75* 12/26/2012 0410   Lipase     Component Value Date/Time   LIPASE 41 12/15/2012 1323       Studies/Results: Dg Chest Port 1 View  12/26/2012  *RADIOLOGY REPORT*  Clinical Data: Evaluation of lines.  PORTABLE CHEST - 1 VIEW  Comparison: 12/25/2012.  Findings: Post removal of endotracheal tube.  Tip of left internal jugular venous catheter terminates in superior vena cava.  Tip of enteric tube extends into the distal stomach area.  Tip is not included on image.  Enlargement of the cardiac silhouette unchanged.  Elevation of right hemidiaphragm with minimal atelectasis of the right lung base with overall low volume of right lung.  Opacity in the inferior left hemithorax with loss of definition of margin of left hemidiaphragm with left basilar atelectasis and opacity.  There is some improved aeration of the lower portion of the left lung laterally.  There is overall low lung volumes.  Small amounts of pleural fluid cannot be excluded. There is mild vascular congestion pattern accentuated by low lung volumes and semi erect positioning.  IMPRESSION: Post removal of endotracheal tube.  Tip of left internal jugular venous catheter terminates in superior vena cava.  Tip of enteric tube extends into the distal stomach area.  Tip is not included on image.  Enlargement of the cardiac silhouette unchanged. Low lung volumes.  Minimal right basilar atelectasis.  Atelectasis and opacity in the inferior left hemithorax.  Small pleural effusions cannot be excluded.  Vascular congestion pattern accentuated by low lung volumes and semi-erect positioning.  No new lesion evident.   Original Report Authenticated By: Onalee Hua Call    Dg Chest Port 1 View  12/25/2012  *RADIOLOGY REPORT*  Clinical Data: Evaluate ETT, lines  PORTABLE CHEST - 1 VIEW  Comparison: 12/23/2012  Findings: Cardiomegaly with pulmonary vascular congestion and suspected mild interstitial edema.  Layering small bilateral pleural effusions.  No  pneumothorax.  Endotracheal tube terminates 4 cm above the carina.  Stable left IJ venous catheter.  Enteric tube likely courses below the diaphragm but is poorly visualized.  IMPRESSION: Endotracheal tube terminates 4 cm above the carina.  Cardiomegaly with pulmonary vascular congestion and suspected mild interstitial edema.  Layering small bilateral pleural effusions.   Original Report Authenticated By: Charline Bills, M.D.     Anti-infectives: Anti-infectives   Start     Dose/Rate Route Frequency Ordered Stop   12/19/12 1000  piperacillin-tazobactam (ZOSYN) IVPB 3.375 g     3.375 g 12.5 mL/hr over 240 Minutes Intravenous 3 times per day 12/19/12 0940     12/18/12 1800  imipenem-cilastatin (PRIMAXIN) 500 mg in sodium chloride 0.9 % 100 mL IVPB  Status:  Discontinued     500 mg 200 mL/hr over 30 Minutes Intravenous 4 times per day 12/18/12 1402 12/19/12 0923   12/18/12 0900  vancomycin (VANCOCIN) 2,000 mg in sodium chloride 0.9 % 500 mL IVPB  Status:  Discontinued     2,000 mg 250 mL/hr over 120 Minutes Intravenous Every 48 hours 12/16/12 0927 12/17/12 1236   12/17/12 1400  vancomycin (VANCOCIN) 2,000 mg in sodium chloride 0.9 % 500 mL IVPB  Status:  Discontinued     2,000 mg 250 mL/hr over 120 Minutes Intravenous Every 24 hours 12/17/12 1236 12/18/12 1005   12/17/12 1000  micafungin (MYCAMINE) 100 mg in sodium chloride 0.9 % 100 mL IVPB     100 mg 100 mL/hr over 1 Hours Intravenous Daily 12/16/12 0908 12/20/12 1059   12/16/12 2300  ertapenem (INVANZ) 1 g in sodium chloride 0.9 % 50 mL IVPB  Status:  Discontinued     1 g 100 mL/hr over 30 Minutes Intravenous Every 24 hours 12/16/12 0230 12/16/12 0908   12/16/12 1200  imipenem-cilastatin (PRIMAXIN) 250 mg in sodium chloride 0.9 % 100 mL IVPB  Status:  Discontinued     250 mg 200 mL/hr over 30 Minutes Intravenous 4 times per day 12/16/12 0927 12/18/12 1402   12/16/12 1000  micafungin (MYCAMINE) 100 mg in sodium chloride 0.9 % 100 mL IVPB      100 mg 100 mL/hr over 1 Hours Intravenous Daily 12/16/12 0908 12/16/12 1239   12/16/12 0930  vancomycin (VANCOCIN) 2,500 mg in sodium chloride 0.9 % 500 mL IVPB     2,500 mg 250 mL/hr over 120 Minutes Intravenous  Once 12/16/12 0927 12/16/12 1334   12/16/12 0600  ertapenem (INVANZ) 1 g in sodium chloride 0.9 % 50 mL IVPB     1 g 100 mL/hr over 30 Minutes Intravenous On call to O.R. 12/15/12 2129 12/15/12 2254   12/15/12 2015  [MAR Hold]  piperacillin-tazobactam (ZOSYN) IVPB 3.375 g     (On MAR Hold since 12/15/12 2236)   3.375 g 12.5 mL/hr over 240 Minutes Intravenous  Once 12/15/12 2006 12/16/12 0034  Assessment/Plan 1. Abdominal hernia, bowel strangulation and perforation   s/p EXPLORATORY LAPAROTOMY, enterolysis, small bowel resection - R. Byerly - 12/16/2012   ABDOMINAL VACUUM ASSISTED CLOSURE CHANGE - H. Derrell Lolling - 12/17/12 and 12/19/12   Closure of abdominal wound with MTF Flex-HD biologic mesh, 20 x 25 cm and VAC coverage. D. Shaquanna Lycan 12/24/12  2. VDRF, s/p extubation  3. Morbid obesity  4. Severe malnutrition - on TPN, new central line  5. On Zosyn  6. DVT prophylaxis - on SQ Heparin  7  Post op ileus  Plan: 1. Will do VAC change today at the bedside. 2. Pt has passed some flatus and has minimal NG output.  Can likely dc NGT today and give clear liquids. 3. Try to get OOB and mobilize 4. pulm toilet. 5. Cont TNA until can take more PO. 6. Will give ice chips today and dc NGT.   LOS: 11 days    OSBORNE,KELLY E 12/26/2012, 8:21 AM Pager: [phone]  Extubated. Will pull NGT - but keep NPO for now. Wound VAC changed with Barnetta Chapel.  Flex HD intact and, so far, in place.  Wound looks okay. Patient progressing.  Ovidio Kin, MD, [facility] Pager: [phone] Office phone:  [phone]

## 2012-12-27 DIAGNOSIS — E46 Unspecified protein-calorie malnutrition: Secondary | ICD-10-CM

## 2012-12-27 LAB — CBC WITH DIFFERENTIAL/PLATELET
Basophils Absolute: 0.1 10*3/uL (ref 0.0–0.1)
Basophils Relative: 1 % (ref 0–1)
Eosinophils Absolute: 0.3 10*3/uL (ref 0.0–0.7)
Hemoglobin: 8 g/dL — ABNORMAL LOW (ref 12.0–15.0)
MCH: 27.7 pg (ref 26.0–34.0)
MCHC: 31.5 g/dL (ref 30.0–36.0)
Neutro Abs: 11.7 10*3/uL — ABNORMAL HIGH (ref 1.7–7.7)
Neutrophils Relative %: 74 % (ref 43–77)
Platelets: 441 10*3/uL — ABNORMAL HIGH (ref 150–400)
RDW: 13.5 % (ref 11.5–15.5)

## 2012-12-27 LAB — COMPREHENSIVE METABOLIC PANEL
ALT: 24 U/L (ref 0–35)
AST: 30 U/L (ref 0–37)
Albumin: 2 g/dL — ABNORMAL LOW (ref 3.5–5.2)
CO2: 26 mEq/L (ref 19–32)
Calcium: 9.7 mg/dL (ref 8.4–10.5)
Creatinine, Ser: 0.91 mg/dL (ref 0.50–1.10)
GFR calc non Af Amer: 74 mL/min — ABNORMAL LOW (ref >90)
Sodium: 143 mEq/L (ref 135–145)
Total Protein: 6.4 g/dL (ref 6.0–8.3)

## 2012-12-27 LAB — GLUCOSE, CAPILLARY
Glucose-Capillary: 174 mg/dL — ABNORMAL HIGH (ref 70–99)
Glucose-Capillary: 142 mg/dL — ABNORMAL HIGH (ref 70–99)
Glucose-Capillary: 181 mg/dL — ABNORMAL HIGH (ref 70–99)
Glucose-Capillary: 180 mg/dL — ABNORMAL HIGH (ref 70–99)
Glucose-Capillary: 131 mg/dL — ABNORMAL HIGH (ref 70–99)

## 2012-12-27 LAB — MAGNESIUM: Magnesium: 2 mg/dL (ref 1.5–2.5)

## 2012-12-27 LAB — IRON AND TIBC
Iron: 36 ug/dL — ABNORMAL LOW (ref 42–135)
TIBC: 207 ug/dL — ABNORMAL LOW (ref 250–470)

## 2012-12-27 LAB — FERRITIN: Ferritin: 582 ng/mL — ABNORMAL HIGH (ref 10–291)

## 2012-12-27 LAB — PHOSPHORUS: Phosphorus: 3.1 mg/dL (ref 2.3–4.6)

## 2012-12-27 MED ORDER — INSULIN REGULAR HUMAN 100 UNIT/ML IJ SOLN
INTRAVENOUS | Status: DC
  Administered 2012-12-27: 18:00:00 via INTRAVENOUS
  Filled 2012-12-27: qty 2400

## 2012-12-27 MED ORDER — PANTOPRAZOLE SODIUM 40 MG PO TBEC
40.0000 mg | DELAYED_RELEASE_TABLET | Freq: Every day | ORAL | Status: DC
  Administered 2012-12-28 – 2012-12-30 (×3): 40 mg via ORAL
  Filled 2012-12-27 (×3): qty 1

## 2012-12-27 MED ORDER — HYDROCODONE-ACETAMINOPHEN 5-325 MG PO TABS
1.0000 | ORAL_TABLET | ORAL | Status: DC | PRN
  Administered 2012-12-27: 2 via ORAL
  Administered 2012-12-28 – 2012-12-29 (×3): 1 via ORAL
  Administered 2012-12-30: 2 via ORAL
  Filled 2012-12-27 (×2): qty 2
  Filled 2012-12-27: qty 1
  Filled 2012-12-27 (×3): qty 2

## 2012-12-27 NOTE — Progress Notes (Signed)
Pt took Bipap off herself and requests not to go back on anymore tonight so she can get some rest. RT will continue to monitor.

## 2012-12-27 NOTE — Progress Notes (Signed)
Physical Therapy Treatment Patient Details Name: Erin Schultz MRN: [id number] DOB: May 11, 1966 Today's Date: 12/27/2012 Time: 4098-1191 PT Time Calculation (min): 34 min  PT Assessment / Plan / Recommendation Comments on Treatment Session  pt s/p sepsis and VDRF, not extubated, with abdominal wound.  Patient did much better today, ambulating further and with less assistance.  still requires +2 for lines/tubes only.  Patient would benefit from CIR to increase mobility to independent level for return home.      Follow Up Recommendations  CIR     Does the patient have the potential to tolerate intense rehabilitation     Barriers to Discharge        Equipment Recommendations  Rolling walker with 5" wheels    Recommendations for Other Services    Frequency Min 3X/week   Plan Discharge plan remains appropriate;Frequency remains appropriate    Precautions / Restrictions     Pertinent Vitals/Pain n/a    Mobility  Transfers Transfers: Sit to Stand;Stand to Sit Sit to Stand: 4: Min assist;From elevated surface;With upper extremity assist Stand to Sit: 4: Min assist;With upper extremity assist;To elevated surface Details for Transfer Assistance: cuing to lean forward during sit to/from stand Ambulation/Gait Ambulation/Gait Assistance: 4: Min assist (required +2 for lines/tubes) Ambulation Distance (Feet): 40 Feet Assistive device: Rolling walker Gait Pattern: Step-to pattern;Decreased stride length Gait velocity: decreased    Exercises     PT Diagnosis:    PT Problem List:   PT Treatment Interventions:     PT Goals Acute Rehab PT Goals PT Goal: Sit to Stand - Progress: Progressing toward goal PT Transfer Goal: Bed to Chair/Chair to Bed - Progress: Progressing toward goal PT Goal: Ambulate - Progress: Progressing toward goal  Visit Information  Last PT Received On: 12/27/12 Assistance Needed: +2 (+2 for lines/tubes only)    Subjective Data  Subjective: I walked to the  door and back this morning   Cognition  Cognition Overall Cognitive Status: Appears within functional limits for tasks assessed/performed Arousal/Alertness: Awake/alert Orientation Level: Appears intact for tasks assessed Behavior During Session: [facility] for tasks performed    Balance     End of Session PT - End of Session Equipment Utilized During Treatment: Oxygen Activity Tolerance: Patient tolerated treatment well Patient left: in chair;with call bell/phone within reach Nurse Communication: Mobility status   GP     Olivia Canter, PT 12/27/2012, 1:47 PM

## 2012-12-27 NOTE — Progress Notes (Signed)
General Surgery Note  LOS: 12 days  POD -   3 Days Post-Op Room - 2314  Assessment/Plan: 1.  EXPLORATORY LAPAROTOMY, enterolysis, small bowel resection - R. Byerly - 12/16/2012  ABDOMINAL VACUUM ASSISTED CLOSURE CHANGE - H. Derrell Lolling - 12/17/12 and 12/19/12  Fascial closure with Flex HD - D. Ferdie Bakken - 12/24/2012  2.  Morbid obesity 3.  Severe malnutrition - on TPN  Will start clear liquids.  Will cont TPN until we see how she tolerates a diet. 4.  Loose stools 5.  On Zosyn 6.  DVT prophylaxis - on SQ Heparin  7.  Foley   Will try this out.  Subjective:  Doing better every day.  Up in chair.  Mother - Veleta Miners - [phone].  I spoke to her by phone. Objective:   Filed Vitals:   12/27/12 0749  BP:   Pulse:   Temp: 97 F (36.1 C)  Resp:      Intake/Output from previous day:  02/14 0701 - 02/15 0700 In: 2905 [I.V.:480; NG/GT:30; IV Piggyback:185; TPN:2210] Out: 1870 [Urine:1800; Emesis/NG output:20; Drains:50]  Intake/Output this shift:      Physical Exam:   General: Morbidly obese  AA F who is doing well.  She is hungry.   HEENT: Normal. Pupils equal. .   Lungs: Clear   Abdomen: VAC intact.  It is hard to keep the binder over her lower abdomen.     Lab Results:     Recent Labs  12/26/12 0410 12/27/12 0405  WBC 21.4* 15.9*  HGB 8.5* 8.0*  HCT 26.5* 25.4*  PLT 449* 441*    BMET    Recent Labs  12/26/12 0410 12/27/12 0405  NA 140 143  K 4.0 3.8  CL 104 107  CO2 28 26  GLUCOSE 218* 182*  BUN 30* 23  CREATININE 1.02 0.91  CALCIUM 9.5 9.7    PT/INR  No results found for this basename: LABPROT, INR,  in the last 72 hours  ABG  No results found for this basename: PHART, PCO2, PO2, HCO3,  in the last 72 hours   Studies/Results:  Dg Chest Port 1 View  12/26/2012  *RADIOLOGY REPORT*  Clinical Data: Evaluation of lines.  PORTABLE CHEST - 1 VIEW  Comparison: 12/25/2012.  Findings: Post removal of endotracheal tube.  Tip of left internal jugular venous  catheter terminates in superior vena cava.  Tip of enteric tube extends into the distal stomach area.  Tip is not included on image.  Enlargement of the cardiac silhouette unchanged.  Elevation of right hemidiaphragm with minimal atelectasis of the right lung base with overall low volume of right lung.  Opacity in the inferior left hemithorax with loss of definition of margin of left hemidiaphragm with left basilar atelectasis and opacity.  There is some improved aeration of the lower portion of the left lung laterally.  There is overall low lung volumes.  Small amounts of pleural fluid cannot be excluded. There is mild vascular congestion pattern accentuated by low lung volumes and semi erect positioning.  IMPRESSION: Post removal of endotracheal tube.  Tip of left internal jugular venous catheter terminates in superior vena cava.  Tip of enteric tube extends into the distal stomach area.  Tip is not included on image.  Enlargement of the cardiac silhouette unchanged. Low lung volumes.  Minimal right basilar atelectasis.  Atelectasis and opacity in the inferior left hemithorax.  Small pleural effusions cannot be excluded.  Vascular congestion pattern accentuated by low lung  volumes and semi-erect positioning.  No new lesion evident.   Original Report Authenticated By: Onalee Hua Call      Anti-infectives:   Anti-infectives   Start     Dose/Rate Route Frequency Ordered Stop   12/19/12 1000  piperacillin-tazobactam (ZOSYN) IVPB 3.375 g     3.375 g 12.5 mL/hr over 240 Minutes Intravenous 3 times per day 12/19/12 0940     12/18/12 1800  imipenem-cilastatin (PRIMAXIN) 500 mg in sodium chloride 0.9 % 100 mL IVPB  Status:  Discontinued     500 mg 200 mL/hr over 30 Minutes Intravenous 4 times per day 12/18/12 1402 12/19/12 0923   12/18/12 0900  vancomycin (VANCOCIN) 2,000 mg in sodium chloride 0.9 % 500 mL IVPB  Status:  Discontinued     2,000 mg 250 mL/hr over 120 Minutes Intravenous Every 48 hours 12/16/12 0927  12/17/12 1236   12/17/12 1400  vancomycin (VANCOCIN) 2,000 mg in sodium chloride 0.9 % 500 mL IVPB  Status:  Discontinued     2,000 mg 250 mL/hr over 120 Minutes Intravenous Every 24 hours 12/17/12 1236 12/18/12 1005   12/17/12 1000  micafungin (MYCAMINE) 100 mg in sodium chloride 0.9 % 100 mL IVPB     100 mg 100 mL/hr over 1 Hours Intravenous Daily 12/16/12 0908 12/20/12 1059   12/16/12 2300  ertapenem (INVANZ) 1 g in sodium chloride 0.9 % 50 mL IVPB  Status:  Discontinued     1 g 100 mL/hr over 30 Minutes Intravenous Every 24 hours 12/16/12 0230 12/16/12 0908   12/16/12 1200  imipenem-cilastatin (PRIMAXIN) 250 mg in sodium chloride 0.9 % 100 mL IVPB  Status:  Discontinued     250 mg 200 mL/hr over 30 Minutes Intravenous 4 times per day 12/16/12 0927 12/18/12 1402   12/16/12 1000  micafungin (MYCAMINE) 100 mg in sodium chloride 0.9 % 100 mL IVPB     100 mg 100 mL/hr over 1 Hours Intravenous Daily 12/16/12 0908 12/16/12 1239   12/16/12 0930  vancomycin (VANCOCIN) 2,500 mg in sodium chloride 0.9 % 500 mL IVPB     2,500 mg 250 mL/hr over 120 Minutes Intravenous  Once 12/16/12 0927 12/16/12 1334   12/16/12 0600  ertapenem (INVANZ) 1 g in sodium chloride 0.9 % 50 mL IVPB     1 g 100 mL/hr over 30 Minutes Intravenous On call to O.R. 12/15/12 2129 12/15/12 2254   12/15/12 2015  [MAR Hold]  piperacillin-tazobactam (ZOSYN) IVPB 3.375 g     (On MAR Hold since 12/15/12 2236)   3.375 g 12.5 mL/hr over 240 Minutes Intravenous  Once 12/15/12 2006 12/16/12 0034      Ovidio Kin, MD, FACS Pager: [phone],   [facility] Office: [phone] 12/27/2012

## 2012-12-27 NOTE — Progress Notes (Signed)
RN had taken pt off NIV stated about 30 mins ago and placed on 3L [place] to give break. Pt placed back on previous settings at this time and RT will continue to monitor.

## 2012-12-27 NOTE — Progress Notes (Signed)
PULMONARY  / CRITICAL CARE MEDICINE  Name: Erin Schultz MRN: [id number] DOB: Jul 28, 1966    ADMISSION DATE:  12/15/2012 CONSULTATION DATE:  12/16/12  REFERRING MD :  Donell Beers (CCS)  CHIEF COMPLAINT:  Abdominal pain  BRIEF PATIENT DESCRIPTION: 47 y/o morbidly obese female was admitted on 12/15/12 with an incarcerated abdominal wall hernia and perforated small bowel requiring emergent surgery.  PCCM consulted for post op vent management.  SIGNIFICANT EVENTS / STUDIES:  2/3 - CT abdomen>> large anterior abdominal wall hernia, perforation, and complex fluid collection deep to the bowel; small L kidney, likely poorly functioning R kidney given lack of contrast.  Fatty infiltration of liver 2/3 -  ex-lap, lysis of adhesions, perforation repair, 25cm of bowel resection 2/4 - ARF, limited urine output 2/5 - OR for reexploration - No evidence of infection, ischemia, or leak. Unable to close the fascia due to edema.  2/6 - neg balance, hd cath removed 2/8 - TNA started, net neg 3600 L 2/9 - stable, VAC remains in place 2/12 - closed wound with biologic mesh, still with wound vac   LINES / TUBES: 2/3 ETT >> 2/13 2/3 R IJ CVL >> 2/10 2/3 radial a-line >> out 2/4 left ij HD>>>2/6 2/10 Lt IJ CVL >>   CULTURES: 2/4 blood >>negative 2/3 urine >>multiple bacterial morphotypes  ANTIBIOTICS: 2/4 Ertapenem >>2/4 2/4 Micafungin>>>2/8 2/4 vancomycin>>>2/6 2/4 Imipenem>>>2/7 2/7 zosyn>>>  SUBJECTIVE: Feels better.  Denies chest pain, dyspnea.  Feels like BiPAP helps her breathing while asleep.  Walked in hall this AM.  VITAL SIGNS: Temp:  [97 F (36.1 C)-98.5 F (36.9 C)] 98.2 F (36.8 C) (02/15 1137) Pulse Rate:  [76-95] 84 (02/15 1300) Resp:  [22-48] 28 (02/15 1300) BP: (94-151)/(53-98) 132/82 mmHg (02/15 1300) SpO2:  [88 %-97 %] 95 % (02/15 1300)  INTAKE / OUTPUT: Intake/Output     02/14 0701 - 02/15 0700 02/15 0701 - 02/16 0700   P.O.  220   I.V. (mL/kg) 480 (3.3) 120 (0.8)   NG/GT  30    IV Piggyback 185 35   TPN 2210 660   Total Intake(mL/kg) 2905 (20.2) 1035 (7.2)   Urine (mL/kg/hr) 1800 (0.5) 125 (0.1)   Emesis/NG output 20 (0)    Drains 50 (0)    Total Output 1870 125   Net +1035 +910        Urine Occurrence  2 x   Stool Occurrence 2 x 9 x     PHYSICAL EXAMINATION:  Gen: No distress HEENT: No sinus tenderness PULM: Decreased breath sounds, no wheeze CV: soft, non tender AB: Wound site clean Ext: ankle edema Neuro: awake, oriented   LABS:  Recent Labs Lab 12/25/12 0400 12/26/12 0410 12/27/12 0405  HGB 9.4* 8.5* 8.0*  HCT 30.1* 26.5* 25.4*  WBC 24.7* 21.4* 15.9*  PLT 433* 449* 441*    Recent Labs Lab 12/23/12 0400 12/24/12 0426 12/25/12 0400 12/26/12 0410 12/27/12 0405  NA 139 141 139 140 143  K 3.5 3.4* 4.1 4.0 3.8  CL 98 99 101 104 107  CO2 32 31 29 28 26   GLUCOSE 270* 261* 140* 218* 182*  BUN 18 27* 36* 30* 23  CREATININE 1.03 1.12* 1.30* 1.02 0.91  CALCIUM 8.9 9.3 9.3 9.5 9.7  MG 1.6 1.7 1.7 2.1 2.0  PHOS 3.5 3.5 3.6 3.3 3.1   Imaging: Dg Chest Port 1 View  12/26/2012  *RADIOLOGY REPORT*  Clinical Data: Evaluation of lines.  PORTABLE CHEST - 1 VIEW  Comparison: 12/25/2012.  Findings: Post removal of endotracheal tube.  Tip of left internal jugular venous catheter terminates in superior vena cava.  Tip of enteric tube extends into the distal stomach area.  Tip is not included on image.  Enlargement of the cardiac silhouette unchanged.  Elevation of right hemidiaphragm with minimal atelectasis of the right lung base with overall low volume of right lung.  Opacity in the inferior left hemithorax with loss of definition of margin of left hemidiaphragm with left basilar atelectasis and opacity.  There is some improved aeration of the lower portion of the left lung laterally.  There is overall low lung volumes.  Small amounts of pleural fluid cannot be excluded. There is mild vascular congestion pattern accentuated by low lung volumes  and semi erect positioning.  IMPRESSION: Post removal of endotracheal tube.  Tip of left internal jugular venous catheter terminates in superior vena cava.  Tip of enteric tube extends into the distal stomach area.  Tip is not included on image.  Enlargement of the cardiac silhouette unchanged. Low lung volumes.  Minimal right basilar atelectasis.  Atelectasis and opacity in the inferior left hemithorax.  Small pleural effusions cannot be excluded.  Vascular congestion pattern accentuated by low lung volumes and semi-erect positioning.  No new lesion evident.   Original Report Authenticated By: Onalee Hua Call     CBG (last 3)   Recent Labs  12/27/12 12/27/12 0746 12/27/12 1135  GLUCAP 150* 181* 180*     Assessment/Plan:  A: Strangulated/perforated abdominal hernia s/p repair. P: Clear liquid >> advance as tolerated per CCS TNA until able to take adequate oral feedings Post-op care per CCS Continue zosyn, D 2/14 Protonix for SUP >> may be able to d/c soon  A: Probable OSA. P: Auto-CPAP qhs Further assessment for OSA as outpt Oxygen as needed fot keep SpO2 > 92% F/u CXR as needed Goal even to negative fluid balance  A: Hx of HTN P: Hold amlodipine, benazepril, HCTZ, triamterene for now  A: CKD >> improved. P: Monitor renal fx, urine outpt, electrolytes  A: Anemia. P: F/u CBC Check anemia panel Transfuse for Hb < 7 SQ heparin for DVT prevetion  A: DM type II. P: SSI Continue lantus 15 units qd Hold outpt metformin for now  A: Deconditioning. P: Continue working with PT/OT Evaluation for CIR  Resolved problems >> Acute respiratory failure, septic shock  Transfer to telemetry 2/15.  Keep on PCCM service >> likely ready for CIR soon.  Coralyn Helling, MD [facility] Pulmonary/Critical Care 12/27/2012, 1:35 PM Pager:  [phone] After 3pm call: [phone]

## 2012-12-27 NOTE — Progress Notes (Signed)
PARENTERAL NUTRITION CONSULT NOTE - Follow-up  Pharmacy Consult for TPN Indication: Open abdomen s/p hernia repair, small bowel resection; unable to feed enterally  Allergies  Allergen Reactions  . Corn-Containing Products Nausea And Vomiting    Stomach inflamed  . Sulfa Antibiotics     Childhood reaction    Patient Measurements: Height: 5\' 7"  (170.2 cm) Weight: 316 lb 9.3 oz (143.6 kg) IBW/kg (Calculated) : 61.6 Adjusted weight: 87.8 kg  Vital Signs: Temp: 97 F (36.1 C) (02/15 0749) Temp src: Oral (02/15 0749) BP: 94/75 mmHg (02/15 0700) Pulse Rate: 83 (02/15 0700) Intake/Output from previous day: 02/14 0701 - 02/15 0700 In: 2905 [I.V.:480; NG/GT:30; IV Piggyback:185; TPN:2210] Out: 1870 [Urine:1800; Emesis/NG output:20; Drains:50] Intake/Output from this shift:    Labs:  Recent Labs  12/25/12 0400 12/26/12 0410 12/27/12 0405  WBC 24.7* 21.4* 15.9*  HGB 9.4* 8.5* 8.0*  HCT 30.1* 26.5* 25.4*  PLT 433* 449* 441*     Recent Labs  12/25/12 0400 12/26/12 0410 12/27/12 0405  NA 139 140 143  K 4.1 4.0 3.8  CL 101 104 107  CO2 29 28 26   GLUCOSE 140* 218* 182*  BUN 36* 30* 23  CREATININE 1.30* 1.02 0.91  CALCIUM 9.3 9.5 9.7  MG 1.7 2.1 2.0  PHOS 3.6 3.3 3.1  PROT 6.4  --  6.4  ALBUMIN 2.1*  --  2.0*  AST 42*  --  30  ALT 34  --  24  ALKPHOS 99  --  102  BILITOT 0.2*  --  0.2*   Estimated Creatinine Clearance: 113.9 ml/min (by C-G formula based on Cr of 0.91).    Recent Labs  12/26/12 1901 12/26/12 2130 12/27/12  GLUCAP 169* 142* 150*   Insulin Requirements in the past 24 hours:  Lantus 15 units + insulin R 50 units in TPN + Novolog 12 units  Nutrition Needs: (new per RD note 2/10) Kcals: 2100-2300  Protein 125-135 g  Current Nutrition:  Clear Liquid Diet ordered 2/15 +  Clinimix E 5/15 at 189ml/hr (120 gm protein/day and avg of 1920 Kcal/d)  Assessment: 16 yof admitted 2/3 with an incarcerated abdominal wall hernia and perforated small  bowel. Emergently went to the OR for ex-lap, LOA, perforation repair and bowel resection. Back to OR on 2/5 for exploration, unable to close fascia due to edema. TPN initiated 2/8 due to anticipated prolonged NPO status.  GI: Open abdomen s/p perf repair, small bowel resection. 2/10: Partial closure abdominal fascia with a VAC change, unable to close abd. 2/12 OR for closure of abdomen and vac placement. NG 20, Drains 50 out, both down from yesterday.  Endo: Hx DM (A1c 8.8) - CBGs now within goal of <180 after increasing insulin in TPN + Lantus, and adding SSI coverage.  LytesJudieth Keens are ok.   Renal: Hx CKD. S/p CRRT for oliguric ARF (CRRT stopped 2/5). Scr 0.9, UOP ok at 0.5 ml/kg/hr.  Pulm: 3L [place]  Cards: Hx HTN -VSS off pressors (PTA lotrel, triam/hctz)  Hepatobil: Tbili WNL. LFTs WNL. Trig 180 to 198, TC 131 to 142. Prealbumin low at baseline, expected with post-op status and ongoing inflammation. Would expect slow progress.  ID: Zosyn empiric for peritonitis. Completed micafungin. Temp improved, WBC improving  Best Practices: heparin SQ, PPI IV  TPN Access: CVC  TPN day#: 7  Plan:  - Continue Clinimix E 5/15 at 175ml/hr -would hold off on advancing TPN further d/t high insulin requirement and diet initiation. - Will continue Lantus  at 15 units daily, SSI coverage 4x/day, and insulin in TPN at 50 units/bag. - Lipids + MVI + TE on MWF only d/t national shortage - Check BMET and CBGs/SSI requirements in AM - Follow-up diet tolerance and potential advancement  Estella Husk, Pharm.D., BCPS Clinical Pharmacist Phone: [phone] or [phone] Pager: [phone] 12/27/2012, 9:14 AM

## 2012-12-28 DIAGNOSIS — G4733 Obstructive sleep apnea (adult) (pediatric): Secondary | ICD-10-CM

## 2012-12-28 LAB — GLUCOSE, CAPILLARY
Glucose-Capillary: 137 mg/dL — ABNORMAL HIGH (ref 70–99)
Glucose-Capillary: 159 mg/dL — ABNORMAL HIGH (ref 70–99)
Glucose-Capillary: 115 mg/dL — ABNORMAL HIGH (ref 70–99)

## 2012-12-28 LAB — CBC WITH DIFFERENTIAL/PLATELET
Basophils Absolute: 0.1 10*3/uL (ref 0.0–0.1)
Eosinophils Relative: 2 % (ref 0–5)
Lymphocytes Relative: 17 % (ref 12–46)
Lymphs Abs: 2.2 10*3/uL (ref 0.7–4.0)
MCV: 86.8 fL (ref 78.0–100.0)
Monocytes Relative: 6 % (ref 3–12)
Platelets: 501 10*3/uL — ABNORMAL HIGH (ref 150–400)
RBC: 3.04 MIL/uL — ABNORMAL LOW (ref 3.87–5.11)
RDW: 13.5 % (ref 11.5–15.5)
WBC: 13.1 10*3/uL — ABNORMAL HIGH (ref 4.0–10.5)

## 2012-12-28 LAB — BASIC METABOLIC PANEL
Calcium: 9.7 mg/dL (ref 8.4–10.5)
Creatinine, Ser: 0.82 mg/dL (ref 0.50–1.10)
GFR calc Af Amer: >90 mL/min (ref >90)
GFR calc non Af Amer: 84 mL/min — ABNORMAL LOW (ref >90)
Sodium: 138 mEq/L (ref 135–145)

## 2012-12-28 LAB — MAGNESIUM: Magnesium: 1.8 mg/dL (ref 1.5–2.5)

## 2012-12-28 LAB — PHOSPHORUS: Phosphorus: 3.4 mg/dL (ref 2.3–4.6)

## 2012-12-28 MED ORDER — SODIUM CHLORIDE 0.9 % IJ SOLN
10.0000 mL | INTRAMUSCULAR | Status: DC | PRN
  Administered 2012-12-28 – 2012-12-30 (×3): 10 mL
  Administered 2012-12-30: 30 mL

## 2012-12-28 NOTE — Progress Notes (Signed)
General Surgery Note  LOS: 13 days  POD -   4 Days Post-Op Room - 2314  Assessment/Plan: 1.  EXPLORATORY LAPAROTOMY, enterolysis, small bowel resection - R. Byerly - 12/16/2012  ABDOMINAL VACUUM ASSISTED CLOSURE CHANGE - H. Derrell Lolling - 12/17/12 and 12/19/12  Fascial closure with Flex HD - D. Arfa Lamarca - 12/24/2012  Over all doing well.  VAC change M, W, F  Using binder to try to support her abdomen.  2.  Morbid obesity 3.  Severe malnutrition - on TPN  Will stop TPN and increase diet to full liquids. 4.  Loose stools - a little better today 5.  On Zosyn 6.  DVT prophylaxis - on SQ Heparin  7.  Foley   Has tolerated this out.  Subjective:  Doing better every day.  Up in chair.  No nausea with liquids.  Mother - Veleta Miners - [phone].  Objective:   Filed Vitals:   12/28/12 0426  BP: 110/67  Pulse: 73  Temp: 98.6 F (37 C)  Resp: 26     Intake/Output from previous day:  02/15 0701 - 02/16 0700 In: 2014.3 [P.O.:460; I.V.:232.3; IV Piggyback:47.5; TPN:1274.5] Out: 895 [Urine:825; Drains:70]  Intake/Output this shift:      Physical Exam:   General: Morbidly obese  AA F who is doing well.  She is hungry.   HEENT: Normal. Pupils equal. .   Lungs: Clear   Abdomen: VAC intact.  It is hard to keep the binder over her lower abdomen.     Lab Results:     Recent Labs  12/26/12 0410 12/27/12 0405  WBC 21.4* 15.9*  HGB 8.5* 8.0*  HCT 26.5* 25.4*  PLT 449* 441*    BMET    Recent Labs  12/26/12 0410 12/27/12 0405  NA 140 143  K 4.0 3.8  CL 104 107  CO2 28 26  GLUCOSE 218* 182*  BUN 30* 23  CREATININE 1.02 0.91  CALCIUM 9.5 9.7    PT/INR  No results found for this basename: LABPROT, INR,  in the last 72 hours  ABG  No results found for this basename: PHART, PCO2, PO2, HCO3,  in the last 72 hours   Studies/Results:  No results found.   Anti-infectives:   Anti-infectives   Start     Dose/Rate Route Frequency Ordered Stop   12/19/12 1000   piperacillin-tazobactam (ZOSYN) IVPB 3.375 g     3.375 g 12.5 mL/hr over 240 Minutes Intravenous 3 times per day 12/19/12 0940     12/18/12 1800  imipenem-cilastatin (PRIMAXIN) 500 mg in sodium chloride 0.9 % 100 mL IVPB  Status:  Discontinued     500 mg 200 mL/hr over 30 Minutes Intravenous 4 times per day 12/18/12 1402 12/19/12 0923   12/18/12 0900  vancomycin (VANCOCIN) 2,000 mg in sodium chloride 0.9 % 500 mL IVPB  Status:  Discontinued     2,000 mg 250 mL/hr over 120 Minutes Intravenous Every 48 hours 12/16/12 0927 12/17/12 1236   12/17/12 1400  vancomycin (VANCOCIN) 2,000 mg in sodium chloride 0.9 % 500 mL IVPB  Status:  Discontinued     2,000 mg 250 mL/hr over 120 Minutes Intravenous Every 24 hours 12/17/12 1236 12/18/12 1005   12/17/12 1000  micafungin (MYCAMINE) 100 mg in sodium chloride 0.9 % 100 mL IVPB     100 mg 100 mL/hr over 1 Hours Intravenous Daily 12/16/12 0908 12/20/12 1059   12/16/12 2300  ertapenem (INVANZ) 1 g in sodium chloride 0.9 %  50 mL IVPB  Status:  Discontinued     1 g 100 mL/hr over 30 Minutes Intravenous Every 24 hours 12/16/12 0230 12/16/12 0908   12/16/12 1200  imipenem-cilastatin (PRIMAXIN) 250 mg in sodium chloride 0.9 % 100 mL IVPB  Status:  Discontinued     250 mg 200 mL/hr over 30 Minutes Intravenous 4 times per day 12/16/12 0927 12/18/12 1402   12/16/12 1000  micafungin (MYCAMINE) 100 mg in sodium chloride 0.9 % 100 mL IVPB     100 mg 100 mL/hr over 1 Hours Intravenous Daily 12/16/12 0908 12/16/12 1239   12/16/12 0930  vancomycin (VANCOCIN) 2,500 mg in sodium chloride 0.9 % 500 mL IVPB     2,500 mg 250 mL/hr over 120 Minutes Intravenous  Once 12/16/12 0927 12/16/12 1334   12/16/12 0600  ertapenem (INVANZ) 1 g in sodium chloride 0.9 % 50 mL IVPB     1 g 100 mL/hr over 30 Minutes Intravenous On call to O.R. 12/15/12 2129 12/15/12 2254   12/15/12 2015  [MAR Hold]  piperacillin-tazobactam (ZOSYN) IVPB 3.375 g     (On MAR Hold since 12/15/12 2236)    3.375 g 12.5 mL/hr over 240 Minutes Intravenous  Once 12/15/12 2006 12/16/12 0034      Ovidio Kin, MD, FACS Pager: [phone],   [facility] Office: [phone] 12/28/2012

## 2012-12-28 NOTE — Progress Notes (Signed)
PULMONARY  / CRITICAL CARE MEDICINE  Name: Erin Schultz MRN: [id number] DOB: 02/07/1966    ADMISSION DATE:  12/15/2012 CONSULTATION DATE:  12/16/12  REFERRING MD :  Donell Beers (CCS)  CHIEF COMPLAINT:  Abdominal pain  BRIEF PATIENT DESCRIPTION: 47 y/o morbidly obese female was admitted on 12/15/12 with an incarcerated abdominal wall hernia and perforated small bowel requiring emergent surgery.  PCCM consulted for post op vent management.  SIGNIFICANT EVENTS / STUDIES:  2/3 - CT abdomen>> large anterior abdominal wall hernia, perforation, and complex fluid collection deep to the bowel; small L kidney, likely poorly functioning R kidney given lack of contrast.  Fatty infiltration of liver 2/3 -  ex-lap, lysis of adhesions, perforation repair, 25cm of bowel resection 2/4 - ARF, limited urine output 2/5 - OR for reexploration - No evidence of infection, ischemia, or leak. Unable to close the fascia due to edema.  2/6 - neg balance, hd cath removed 2/8 - TNA started, net neg 3600 L 2/9 - stable, VAC remains in place 2/12 - closed wound with biologic mesh, still with wound vac   LINES / TUBES: 2/3 ETT >> 2/13 2/3 R IJ CVL >> 2/10 2/3 radial a-line >> out 2/4 left ij HD>>>2/6 2/10 Lt IJ CVL >>   CULTURES: 2/4 blood >>negative 2/3 urine >>multiple bacterial morphotypes  ANTIBIOTICS: 2/4 Ertapenem >>2/4 2/4 Micafungin>>>2/8 2/4 vancomycin>>>2/6 2/4 Imipenem>>>2/7 2/7 zosyn>>>  SUBJECTIVE: No change overnight.  Wearing bilevel at hs. Trying to sit up as much as possible.  No increased wob.   VITAL SIGNS: Temp:  [98.6 F (37 C)-98.8 F (37.1 C)] 98.6 F (37 C) (02/16 0426) Pulse Rate:  [73-84] 73 (02/16 0426) Resp:  [25-38] 26 (02/16 0426) BP: (110-132)/(59-85) 110/67 mmHg (02/16 0426) SpO2:  [94 %-100 %] 100 % (02/16 0426)  INTAKE / OUTPUT: Intake/Output     02/15 0701 - 02/16 0700 02/16 0701 - 02/17 0700   P.O. 460 240   I.V. (mL/kg) 232.3 (1.6) 60 (0.4)   NG/GT     IV  Piggyback 47.5 50   TPN 1274.5 350   Total Intake(mL/kg) 2014.3 (14) 700 (4.9)   Urine (mL/kg/hr) 825 (0.2) 600 (0.8)   Emesis/NG output     Drains 70 (0)    Total Output 895 600   Net +1119.3 +100        Urine Occurrence 2 x    Stool Occurrence 9 x      PHYSICAL EXAMINATION:  Gen: No distress, morbidly obese HEENT: nares without discharge, no skin breakdown or pressure necrosis from cpap mask. PULM: Decreased breath sounds, no wheeze or crackles. CV: rrr, 2/6 sem AB: post op abdomen with dressings Ext: mild edema, no cyanosis Neuro: awake, oriented, moves all 4.    LABS:  Recent Labs Lab 12/26/12 0410 12/27/12 0405 12/28/12 0825  HGB 8.5* 8.0* 8.3*  HCT 26.5* 25.4* 26.4*  WBC 21.4* 15.9* 13.1*  PLT 449* 441* 501*    Recent Labs Lab 12/24/12 0426 12/25/12 0400 12/26/12 0410 12/27/12 0405 12/28/12 0825  NA 141 139 140 143 138  K 3.4* 4.1 4.0 3.8 3.5  CL 99 101 104 107 104  CO2 31 29 28 26 22   GLUCOSE 261* 140* 218* 182* 195*  BUN 27* 36* 30* 23 19  CREATININE 1.12* 1.30* 1.02 0.91 0.82  CALCIUM 9.3 9.3 9.5 9.7 9.7  MG 1.7 1.7 2.1 2.0 1.8  PHOS 3.5 3.6 3.3 3.1 3.4   Imaging: No results found.  CBG (last 3)  Recent Labs  12/27/12 2154 12/28/12 0623 12/28/12 1110  GLUCAP 137* 159* 122*     Assessment/Plan:  A: Strangulated/perforated abdominal hernia s/p repair. P: Clear liquid >> advance as tolerated per CCS TNA until able to take adequate oral feedings Post-op care per CCS Continue zosyn, D 2/14 Protonix for SUP >> may be able to d/c soon  A: Probable OSA. P: Auto-CPAP qhs Further assessment for OSA as outpt Oxygen as needed fot keep SpO2 > 92%   A: Hx of HTN P: Hold amlodipine, benazepril, HCTZ, triamterene for now  A: CKD >> improved. P: Monitor renal fx, urine outpt, electrolytes  A: Anemia. P: F/u CBC Check anemia panel Transfuse for Hb < 7 SQ heparin for DVT prevention  A: DM type II. P: SSI Continue lantus 15  units qd Hold outpt metformin for now  A: Deconditioning. P: Continue working with PT/OT Evaluation for CIR

## 2012-12-28 NOTE — Progress Notes (Addendum)
Placed patient on CPAP auto titration 6 ( EPAP 20 IPAP) QHS with a nasal mask,  heated humidity and 2 L/M O2 bleed in.   Patient tolerating CPAP well at this time.  RT will continue to monitor.

## 2012-12-29 ENCOUNTER — Encounter (HOSPITAL_COMMUNITY): Payer: Self-pay | Admitting: Surgery

## 2012-12-29 DIAGNOSIS — J309 Allergic rhinitis, unspecified: Secondary | ICD-10-CM

## 2012-12-29 LAB — GLUCOSE, CAPILLARY
Glucose-Capillary: 123 mg/dL — ABNORMAL HIGH (ref 70–99)
Glucose-Capillary: 118 mg/dL — ABNORMAL HIGH (ref 70–99)
Glucose-Capillary: 120 mg/dL — ABNORMAL HIGH (ref 70–99)
Glucose-Capillary: 113 mg/dL — ABNORMAL HIGH (ref 70–99)

## 2012-12-29 LAB — CBC WITH DIFFERENTIAL/PLATELET
Basophils Absolute: 0.1 10*3/uL (ref 0.0–0.1)
HCT: 25.6 % — ABNORMAL LOW (ref 36.0–46.0)
Hemoglobin: 8.1 g/dL — ABNORMAL LOW (ref 12.0–15.0)
Lymphocytes Relative: 21 % (ref 12–46)
Lymphs Abs: 2.2 10*3/uL (ref 0.7–4.0)
MCV: 86.5 fL (ref 78.0–100.0)
Monocytes Absolute: 0.8 10*3/uL (ref 0.1–1.0)
Neutro Abs: 7 10*3/uL (ref 1.7–7.7)
RBC: 2.96 MIL/uL — ABNORMAL LOW (ref 3.87–5.11)
RDW: 13.7 % (ref 11.5–15.5)
WBC: 10.2 10*3/uL (ref 4.0–10.5)

## 2012-12-29 LAB — PHOSPHORUS: Phosphorus: 4 mg/dL (ref 2.3–4.6)

## 2012-12-29 MED ORDER — PRO-STAT SUGAR FREE PO LIQD
30.0000 mL | Freq: Three times a day (TID) | ORAL | Status: DC
  Administered 2012-12-29 – 2012-12-30 (×2): 30 mL via ORAL
  Filled 2012-12-29 (×6): qty 30

## 2012-12-29 MED ORDER — BACITRACIN-NEOMYCIN-POLYMYXIN OINTMENT TUBE
TOPICAL_OINTMENT | Freq: Two times a day (BID) | CUTANEOUS | Status: DC
  Administered 2012-12-29 – 2012-12-30 (×3): via TOPICAL
  Filled 2012-12-29: qty 15

## 2012-12-29 NOTE — Progress Notes (Signed)
PULMONARY  / CRITICAL CARE MEDICINE  Name: Erin Schultz MRN: [id number] DOB: May 13, 1966    ADMISSION DATE:  12/15/2012 CONSULTATION DATE:  12/16/12  REFERRING MD :  Donell Beers (CCS)  CHIEF COMPLAINT:  Abdominal pain  BRIEF PATIENT DESCRIPTION: 47 y/o morbidly obese female was admitted on 12/15/12 with an incarcerated abdominal wall hernia and perforated small bowel requiring emergent surgery.  PCCM consulted for post op vent management.  SIGNIFICANT EVENTS / STUDIES:  2/3 - CT abdomen>> large anterior abdominal wall hernia, perforation, and complex fluid collection deep to the bowel; small L kidney, likely poorly functioning R kidney given lack of contrast.  Fatty infiltration of liver 2/3 -  ex-lap, lysis of adhesions, perforation repair, 25cm of bowel resection 2/4 - ARF, limited urine output 2/5 - OR for reexploration - No evidence of infection, ischemia, or leak. Unable to close the fascia due to edema.  2/6 - neg balance, hd cath removed 2/8 - TNA started, net neg 3600 L 2/9 - stable, VAC remains in place 2/12 - closed wound with biologic mesh, still with wound vac   LINES / TUBES: 2/3 ETT >> 2/13 2/3 R IJ CVL >> 2/10 2/3 radial a-line >> out 2/4 left ij HD>>>2/6 2/10 Lt IJ CVL >>   CULTURES: 2/4 blood >>negative 2/3 urine >>multiple bacterial morphotypes  ANTIBIOTICS: 2/4 Ertapenem >>2/4 2/4 Micafungin>>>2/8 2/4 vancomycin>>>2/6 2/4 Imipenem>>>2/7 2/7 zosyn>>>2-17  SUBJECTIVE: No change overnight.  Wearing bilevel at hs. Trying to sit up as much as possible.  No increased wob.   VITAL SIGNS: Temp:  [97.7 F (36.5 C)-98.7 F (37.1 C)] 98.7 F (37.1 C) (02/17 0520) Pulse Rate:  [73-98] 74 (02/17 0520) Resp:  [20-26] 20 (02/17 0520) BP: (119-140)/(55-105) 120/61 mmHg (02/17 0520) SpO2:  [98 %-100 %] 99 % (02/17 0851)  INTAKE / OUTPUT: Intake/Output     02/16 0701 - 02/17 0700 02/17 0701 - 02/18 0700   P.O. 240    I.V. (mL/kg) 200.3 (1.4)    IV Piggyback 100    TPN 350    Total Intake(mL/kg) 890.3 (6.2)    Urine (mL/kg/hr) 1650 (0.5)    Drains 75 (0)    Total Output 1725     Net -834.7          Urine Occurrence 2 x    Stool Occurrence 2 x      PHYSICAL EXAMINATION:  Gen: No distress, morbidly obese HEENT: nares without discharge, no skin breakdown or pressure necrosis from cpap mask. PULM: Decreased breath sounds, no wheeze or crackles. CV: rrr, 2/6 sem AB: post op abdomen with dressings and vac in place Ext: mild edema, no cyanosis Neuro: awake, oriented, moves all 4.    LABS:  Recent Labs Lab 12/27/12 0405 12/28/12 0825 12/29/12 0500  HGB 8.0* 8.3* 8.1*  HCT 25.4* 26.4* 25.6*  WBC 15.9* 13.1* 10.2  PLT 441* 501* 480*    Recent Labs Lab 12/24/12 0426 12/25/12 0400 12/26/12 0410 12/27/12 0405 12/28/12 0825 12/29/12 0500  NA 141 139 140 143 138  --   K 3.4* 4.1 4.0 3.8 3.5  --   CL 99 101 104 107 104  --   CO2 31 29 28 26 22   --   GLUCOSE 261* 140* 218* 182* 195*  --   BUN 27* 36* 30* 23 19  --   CREATININE 1.12* 1.30* 1.02 0.91 0.82  --   CALCIUM 9.3 9.3 9.5 9.7 9.7  --   MG 1.7 1.7 2.1 2.0 1.8  1.9  PHOS 3.5 3.6 3.3 3.1 3.4 4.0   Imaging: No results found.  CBG (last 3)   Recent Labs  12/28/12 1610 12/28/12 2157 12/29/12 0600  GLUCAP 103* 115* 123*     Assessment/Plan:  A: Strangulated/perforated abdominal hernia s/p repair. P: Clear liquid >> advance as tolerated per CCS TNA until able to take adequate oral feedings. Stopped 2-17 Limit future tpn Post-op care per CCS omplete zosyn,  Protonix for SUP >> may be able to d/c soon  A: Probable OSA. P: Auto-CPAP qhs Further assessment for OSA as outpt, sleep study and titration, pulm leb appt needed within 2 weeks Oxygen as needed fot keep SpO2 > 92% Diuresis tolerated  A: Hx of HTN P: Hold amlodipine, benazepril, HCTZ, triamterene for now  A: CKD >> improved. Lab Results  Component Value Date   CREATININE 0.82 12/28/2012   CREATININE  0.91 12/27/2012   CREATININE 1.02 12/26/2012   CREATININE 0.89 06/03/2012    P: Monitor renal fx, urine outpt, electrolytes  A: Anemia.  Recent Labs  12/28/12 0825 12/29/12 0500  HGB 8.3* 8.1*    P: F/u CBC Check anemia panel Transfuse for Hb < 7 SQ heparin for DVT prevention Allow neg balance  A: DM type II. CBG (last 3)   Recent Labs  12/28/12 1610 12/28/12 2157 12/29/12 0600  GLUCAP 103* 115* 123*     P: SSI Continue lantus 15 units qd Hold outpt metformin for now  A: Deconditioning. P: Continue working with PT/OT Evaluation for CIR OT 2-17 reports cir not needed but has open wound with vac in place. Doubt she can handle this in home setting. 2-17 will transfer to [facility] for 2-18. 2-17 dc cvl and place piv.  Brett Canales Minor ACNP [facility] PCCM Pager [phone] till 3 pm If no answer page [phone] 12/29/2012, 10:04 AM   I have fully examined this patient and agree with above findings.    And edite dinfull  Mcarthur Rossetti. Tyson Alias, MD, FACP Pgr: [phone] [facility]

## 2012-12-29 NOTE — Progress Notes (Signed)
OT evaluation this am. Patient is min guard assist level at this time in the room. Will not need an inpatient rehab admission before d/c home at this time. Recommend home health follow up at this time. [phone]

## 2012-12-29 NOTE — Progress Notes (Signed)
I have seen and examined the patient and agree with the assessment and plans.  Cailin Gebel A. Zak Gondek  MD, FACS  

## 2012-12-29 NOTE — Evaluation (Signed)
Occupational Therapy Evaluation Patient Details Name: Erin Schultz MRN: [id number] DOB: [birth year] Today's Date: 12/29/2012 Time: 4098-1191 OT Time Calculation (min): 32 min  OT Assessment / Plan / Recommendation Clinical Impression  Pleasant 47 yr old female admitted with perforated bowel and hernia repair complicated by vent dependency and now wound vac.  Pt overall currently mod assist for bathing and dressing tasks and min guard assist for functional mobility and transfers with use of DME.  Feel pt will benefit from [facility] at discharge and will have 24 hour supervision from her sister and mother.  Feel she will need a bariatric 3:1 for home and continued home health OT.      OT Assessment  Patient needs continued OT Services    Follow Up Recommendations  Home health OT       Equipment Recommendations  3 in 1 bedside comode;Other (comment) (Bariatric 3:1)       Frequency  Min 2X/week    Precautions / Restrictions Precautions Precautions: None Restrictions Weight Bearing Restrictions: No   Pertinent Vitals/Pain O2 sats 98% on room air    ADL  Eating/Feeding: Simulated;Independent Where Assessed - Eating/Feeding: Edge of bed Grooming: Performed;Min guard Where Assessed - Grooming: Supported standing Upper Body Bathing: Simulated;Set up Where Assessed - Upper Body Bathing: Unsupported sitting Lower Body Bathing: Simulated;Moderate assistance Where Assessed - Lower Body Bathing: Supported sit to stand Upper Body Dressing: Simulated;Set up Where Assessed - Upper Body Dressing: Unsupported sitting Lower Body Dressing: Simulated;Maximal assistance Where Assessed - Lower Body Dressing: Supported sit to stand Toilet Transfer: Performed;Minimal assistance Toilet Transfer Method: Other (comment) (Ambulate with RW.) Toilet Transfer Equipment: Regular height toilet Toileting - Clothing Manipulation and Hygiene: Performed;Moderate assistance Where Assessed - Toileting Clothing  Manipulation and Hygiene: Sit to stand from 3-in-1 or toilet Tub/Shower Transfer Method: Not assessed Equipment Used: Rolling walker Transfers/Ambulation Related to ADLs: Pt overall min guard assist for sit to stand from lower surfaces.  Able to ambulate with the RW and min guard assist as well. ADL Comments: Pt with decreased ability to reach either foot for dressing tasks or perform perianal hygiene efficiently.  Will benefit from AE education and use to help with this.    OT Diagnosis: Generalized weakness;Acute pain  OT Problem List: Decreased strength;Impaired balance (sitting and/or standing);Decreased knowledge of use of DME or AE;Decreased knowledge of precautions;Obesity;Pain OT Treatment Interventions: Self-care/ADL training;DME and/or AE instruction;Therapeutic activities;Energy conservation;Patient/family education   OT Goals Acute Rehab OT Goals OT Goal Formulation: With patient Time For Goal Achievement: 01/12/13 Potential to Achieve Goals: Good ADL Goals Pt Will Perform Grooming: with modified independence;Standing at sink ADL Goal: Grooming - Progress: Goal set today Pt Will Perform Lower Body Bathing: with supervision;Sit to stand from bed;with adaptive equipment ADL Goal: Lower Body Bathing - Progress: Goal set today Pt Will Perform Lower Body Dressing: with supervision;Sit to stand from chair;with adaptive equipment ADL Goal: Lower Body Dressing - Progress: Goal set today Pt Will Transfer to Toilet: with modified independence;Extra wide 3-in-1;with DME;Ambulation ADL Goal: Toilet Transfer - Progress: Goal set today Pt Will Perform Toileting - Hygiene: with modified independence;with adaptive equipment;Sit to stand from 3-in-1/toilet ADL Goal: Toileting - Hygiene - Progress: Goal set today Miscellaneous OT Goals Miscellaneous OT Goal #1: Pt will state at least 3 energy conservation strategies for selfcare tasks independently. OT Goal: Miscellaneous Goal #1 - Progress:  Goal set today  Visit Information  Last OT Received On: 12/29/12 Assistance Needed: +1    Subjective Data  Subjective: I  would be open to someone coming out to the house. Patient Stated Goal: Going home as soon as possible.      Vision/Perception Vision - History Baseline Vision: Wears glasses only for reading Patient Visual Report: No change from baseline Vision - Assessment Eye Alignment: Within Functional Limits Perception Perception: Within Functional Limits Praxis Praxis: Intact   Cognition  Cognition Overall Cognitive Status: Appears within functional limits for tasks assessed/performed Arousal/Alertness: Awake/alert Orientation Level: Appears intact for tasks assessed Behavior During Session: [facility] for tasks performed    Extremity/Trunk Assessment Right Upper Extremity Assessment RUE ROM/Strength/Tone: Within functional levels RUE Sensation: WFL - Light Touch RUE Coordination: WFL - gross/fine motor Left Upper Extremity Assessment LUE ROM/Strength/Tone: Within functional levels LUE Sensation: WFL - Light Touch LUE Coordination: WFL - gross/fine motor Trunk Assessment Trunk Assessment: Normal     Mobility Transfers Transfers: Sit to Stand Sit to Stand: From elevated surface;From bed;From toilet;4: Min assist Stand to Sit: 4: Min assist;To chair/3-in-1 Details for Transfer Assistance: Pt unable to control decent when transitioning to sitting from standing position.        Balance Static Sitting Balance Static Sitting - Balance Support: No upper extremity supported Static Sitting - Level of Assistance: 6: Modified independent (Device/Increase time) Static Standing Balance Static Standing - Balance Support: Right upper extremity supported;Left upper extremity supported Static Standing - Level of Assistance: 5: Stand by assistance   End of Session OT - End of Session Activity Tolerance: Patient limited by fatigue Patient left: in chair;with call bell/phone  within reach Nurse Communication: Mobility status     Kestrel Mis OTR/L Pager number [phone] 12/29/2012, 9:04 AM

## 2012-12-29 NOTE — Consult Note (Signed)
WOC consult Note Reason for Consult: Consult to change abd vac.  Erin Schultz, CCS at bedside to assess first post-op dressing change. Wound type: Full thickness post-op wound with biologic mesh Measurement:20X15X3 cm with undermining to wound edges of 2 cm Wound bed:100% white mesh Drainage (amount, consistency, odor) Small yellow drainage, no odor Periwound:Intact skin surrounding Dressing procedure/placement/frequency: Applied 2 pieces Mepitel to protect mesh, then one large vac sponge to at cont suction.  Pt medicated for pain prior to procedure and tolerated with minimal discomfort. Plan dressing change Wed.  Right tip of nose with partial thickness wound, .2X.1X.1cm.  Dry brown wound bed, no odor or drainage.  Plan: Neosporin to promote moist healing to site.   Cammie Mcgee, RN, MSN, [employer]  [phone]

## 2012-12-29 NOTE — Progress Notes (Signed)
Physical Therapy Treatment Patient Details Name: Erin Schultz MRN: [id number] DOB: September 16, 1966 Today's Date: 12/29/2012 Time: 4098-1191 PT Time Calculation (min): 28 min  PT Assessment / Plan / Recommendation Comments on Treatment Session  Pt progressing very well with mobility, is now min-guard/ supervision level. She fatigues quickly with ambulation but is motivated to walk. Encouraged her to walk with nursing staff as well as PT. Expect that pt will be able to go home with HHPT. PT will continue to follow acutely.    Follow Up Recommendations  Home health PT;Supervision - Intermittent     Does the patient have the potential to tolerate intense rehabilitation     Barriers to Discharge        Equipment Recommendations  Rolling walker with 5" wheels (wide)    Recommendations for Other Services    Frequency Min 3X/week   Plan Discharge plan needs to be updated;Frequency remains appropriate    Precautions / Restrictions Precautions Precautions: Other (comment) Precaution Comments: wound vac Restrictions Weight Bearing Restrictions: No   Pertinent Vitals/Pain 7/10 abdominal pain with ambulation, decreased when ambulation ceased, premedicated    Mobility  Bed Mobility Bed Mobility: Supine to Sit;Sit to Supine Supine to Sit: 5: Supervision;HOB elevated;With rails Sitting - Scoot to Edge of Bed: 5: Supervision Sit to Supine: 5: Supervision;With rail Details for Bed Mobility Assistance: pt's pain manageable to the point that she is getting to edge of bed without physical assist Transfers Transfers: Sit to Stand;Stand to Sit Sit to Stand: From bed;From chair/3-in-1;With upper extremity assist;4: Min guard Stand to Sit: 4: Min guard;To chair/3-in-1;To bed;With upper extremity assist Details for Transfer Assistance: pt transferring with safety from multiple surfaces Ambulation/Gait Ambulation/Gait Assistance: 5: Supervision Ambulation Distance (Feet): 150 Feet Assistive device:  Rolling walker Ambulation/Gait Assistance Details: pt is progressing well with ambulation, HR in low 90s, 2/4 DOE. More steady with RW today Gait Pattern: Step-through pattern;Decreased stride length;Wide base of support Gait velocity: decreased General Gait Details: fatigued with ambulation, one standing rest break Stairs: No Wheelchair Mobility Wheelchair Mobility: No    Exercises     PT Diagnosis:    PT Problem List:   PT Treatment Interventions:     PT Goals Acute Rehab PT Goals PT Goal Formulation: With patient Time For Goal Achievement: 01/08/13 Potential to Achieve Goals: Good Pt will go Supine/Side to Sit: Independently PT Goal: Supine/Side to Sit - Progress: Progressing toward goal Pt will Sit at Edge of Bed: Independently;6-10 min PT Goal: Sit at [place] Of Bed - Progress: Met Pt will go Sit to Stand: with supervision;with upper extremity assist PT Goal: Sit to Stand - Progress: Progressing toward goal Pt will Transfer Bed to Chair/Chair to Bed: with supervision PT Transfer Goal: Bed to Chair/Chair to Bed - Progress: Progressing toward goal Pt will Ambulate: 51 - 150 feet;with least restrictive assistive device;with min assist PT Goal: Ambulate - Progress: Progressing toward goal Pt will Go Up / Down Stairs: 3-5 stairs;with min assist;with least restrictive assistive device PT Goal: Up/Down Stairs - Progress: Progressing toward goal  Visit Information  Last PT Received On: 12/29/12 Assistance Needed: +1    Subjective Data  Subjective: pt uncertain about whether she will be able to manage wound vac at home but felt better when she realized the home unit would not be as large Patient Stated Goal: To be independent   Cognition  Cognition Overall Cognitive Status: Appears within functional limits for tasks assessed/performed Arousal/Alertness: Awake/alert Orientation Level: Appears intact for tasks assessed  Behavior During Session: [facility] for tasks performed     Balance  Balance Balance Assessed: Yes Dynamic Standing Balance Dynamic Standing - Balance Support: Right upper extremity supported;During functional activity Dynamic Standing - Level of Assistance: 5: Stand by assistance  End of Session PT - End of Session Equipment Utilized During Treatment: Gait belt;Other (comment) (wound vac) Activity Tolerance: Patient tolerated treatment well Patient left: in bed;with call bell/phone within reach;with family/visitor present Nurse Communication: Mobility status   GP   Lyanne Co, PT  Acute Rehab Services  [phone]   Lyanne Co 12/29/2012, 4:17 PM

## 2012-12-29 NOTE — Progress Notes (Signed)
5 Days Post-Op  Subjective: Pt feels much better, up sitting in bed eating breakfast.  Pt has been mobilizing with PT/OT and will be going to a rehab facility soon.  Pt denies N/V, and abdominal pain is much improved.  Pt urinating well and having regular BM's, with good flatus.    Objective: Vital signs in last 24 hours: Temp:  [97.7 F (36.5 C)-98.7 F (37.1 C)] 98.7 F (37.1 C) (02/17 0520) Pulse Rate:  [73-98] 74 (02/17 0520) Resp:  [20-26] 20 (02/17 0520) BP: (119-140)/(55-105) 120/61 mmHg (02/17 0520) SpO2:  [98 %-100 %] 98 % (02/17 0520) Last BM Date: 12/28/12  Intake/Output from previous day: 02/16 0701 - 02/17 0700 In: 890.3 [P.O.:240; I.V.:200.3; IV Piggyback:100; TPN:350] Out: 1725 [Urine:1650; Drains:75] Intake/Output this shift:    PE: Gen:  Alert, NAD, pleasant Abd: Soft, NT/ND, +BS, no HSM, wound vac and abdominal binder in place, will come back to examen during wound vac change.   Lab Results:   Recent Labs  12/28/12 0825 12/29/12 0500  WBC 13.1* 10.2  HGB 8.3* 8.1*  HCT 26.4* 25.6*  PLT 501* 480*   BMET  Recent Labs  12/27/12 0405 12/28/12 0825  NA 143 138  K 3.8 3.5  CL 107 104  CO2 26 22  GLUCOSE 182* 195*  BUN 23 19  CREATININE 0.91 0.82  CALCIUM 9.7 9.7   PT/INR No results found for this basename: LABPROT, INR,  in the last 72 hours CMP     Component Value Date/Time   NA 138 12/28/2012 0825   K 3.5 12/28/2012 0825   CL 104 12/28/2012 0825   CO2 22 12/28/2012 0825   GLUCOSE 195* 12/28/2012 0825   BUN 19 12/28/2012 0825   CREATININE 0.82 12/28/2012 0825   CREATININE 0.89 06/03/2012 1546   CALCIUM 9.7 12/28/2012 0825   PROT 6.4 12/27/2012 0405   ALBUMIN 2.0* 12/27/2012 0405   AST 30 12/27/2012 0405   ALT 24 12/27/2012 0405   ALKPHOS 102 12/27/2012 0405   BILITOT 0.2* 12/27/2012 0405   GFRNONAA 84* 12/28/2012 0825   GFRAA >90 12/28/2012 0825   Lipase     Component Value Date/Time   LIPASE 41 12/15/2012 1323       Studies/Results: No  results found.  Anti-infectives: Anti-infectives   Start     Dose/Rate Route Frequency Ordered Stop   12/19/12 1000  piperacillin-tazobactam (ZOSYN) IVPB 3.375 g     3.375 g 12.5 mL/hr over 240 Minutes Intravenous 3 times per day 12/19/12 0940     12/18/12 1800  imipenem-cilastatin (PRIMAXIN) 500 mg in sodium chloride 0.9 % 100 mL IVPB  Status:  Discontinued     500 mg 200 mL/hr over 30 Minutes Intravenous 4 times per day 12/18/12 1402 12/19/12 0923   12/18/12 0900  vancomycin (VANCOCIN) 2,000 mg in sodium chloride 0.9 % 500 mL IVPB  Status:  Discontinued     2,000 mg 250 mL/hr over 120 Minutes Intravenous Every 48 hours 12/16/12 0927 12/17/12 1236   12/17/12 1400  vancomycin (VANCOCIN) 2,000 mg in sodium chloride 0.9 % 500 mL IVPB  Status:  Discontinued     2,000 mg 250 mL/hr over 120 Minutes Intravenous Every 24 hours 12/17/12 1236 12/18/12 1005   12/17/12 1000  micafungin (MYCAMINE) 100 mg in sodium chloride 0.9 % 100 mL IVPB     100 mg 100 mL/hr over 1 Hours Intravenous Daily 12/16/12 0908 12/20/12 1059   12/16/12 2300  ertapenem (INVANZ) 1 g in  sodium chloride 0.9 % 50 mL IVPB  Status:  Discontinued     1 g 100 mL/hr over 30 Minutes Intravenous Every 24 hours 12/16/12 0230 12/16/12 0908   12/16/12 1200  imipenem-cilastatin (PRIMAXIN) 250 mg in sodium chloride 0.9 % 100 mL IVPB  Status:  Discontinued     250 mg 200 mL/hr over 30 Minutes Intravenous 4 times per day 12/16/12 0927 12/18/12 1402   12/16/12 1000  micafungin (MYCAMINE) 100 mg in sodium chloride 0.9 % 100 mL IVPB     100 mg 100 mL/hr over 1 Hours Intravenous Daily 12/16/12 0908 12/16/12 1239   12/16/12 0930  vancomycin (VANCOCIN) 2,500 mg in sodium chloride 0.9 % 500 mL IVPB     2,500 mg 250 mL/hr over 120 Minutes Intravenous  Once 12/16/12 0927 12/16/12 1334   12/16/12 0600  ertapenem (INVANZ) 1 g in sodium chloride 0.9 % 50 mL IVPB     1 g 100 mL/hr over 30 Minutes Intravenous On call to O.R. 12/15/12 2129 12/15/12  2254   12/15/12 2015  [MAR Hold]  piperacillin-tazobactam (ZOSYN) IVPB 3.375 g     (On MAR Hold since 12/15/12 2236)   3.375 g 12.5 mL/hr over 240 Minutes Intravenous  Once 12/15/12 2006 12/16/12 0034       Assessment/Plan 1. EXPLORATORY LAPAROTOMY, enterolysis, small bowel resection - R. Byerly - 12/16/2012   ABDOMINAL VACUUM ASSISTED CLOSURE CHANGE - H. Derrell Lolling - 12/17/12 and 12/19/12   Fascial closure with Flex HD - D. Newman - 12/24/2012   Over all doing well.   VAC change M, W, F   Using binder to try to support her abdomen.  2. Morbid obesity  3. Severe malnutrition - on TPN   Carb control diet today 4. Loose stools - a little better today  5. On Zosyn day 10/10, can likely stop today since WBC is normal now 6. DVT prophylaxis - on SQ Heparin  7. Foley - urinating normally  Disp:  Pt can likely go to rehab soon when medically stable, but likely ready from surgical standpoint since pain well controlled, tolerating fulls - advance today, great bowel function.  No ABX needed since has had 10/10 days.  Will need f/u with Dr. Donell Beers in 2-3 weeks.     LOS: 14 days    DORT, Aundra Millet 12/29/2012, 7:49 AM Pager: [phone]

## 2012-12-29 NOTE — Progress Notes (Signed)
NUTRITION FOLLOW UP  Intervention:    Prostat liquid protein 30 ml 3 times daily (100 kcals, 15 gm protein per dose) RD to follow for nutrition care plan  New Nutrition Dx: Increased nutrient needs related to wound healing as evidenced by estimated nutrition needs  New Goal:   Oral intake with meals & supplements to meet >/= 90% of estimated nutrition needs  Monitor:   PO & supplemental intake, weight, labs, I/O's  Assessment:   Patient s/p procedures 2/4:  EXPLORATORY LAPAROTOMY  LYSIS OF ADHESIONS  SMALL BOWEL RESECTION  PLACEMENT OF ABDOMINAL WOUND VAC  TPN discontinued 2/16.  Diet order advanced from Full Liquids to Carbohydrate Modified Medium Calorie 2/17.  PO intake 75-100% per flowsheet records.  CWOCN note reviewed for abdominal VAC change. + BM's.  Would benefit from addition protein ---> RD to order supplement.  Height: Ht Readings from Last 1 Encounters:  12/16/12 5\' 7"  (1.702 m)    Weight Status:   Wt Readings from Last 1 Encounters:  12/24/12 316 lb 9.3 oz (143.6 kg)    Re-estimated needs:  Kcal: 2100-2300 Protein: 125-135 gm Fluid: 2.1-2.3 L  Skin: abdominal wound  Diet Order: Carb Control   Intake/Output Summary (Last 24 hours) at 12/29/12 1522 Last data filed at 12/29/12 1230  Gross per 24 hour  Intake 620.33 ml  Output   1327 ml  Net -706.67 ml    Labs:   Recent Labs Lab 12/26/12 0410 12/27/12 0405 12/28/12 0825 12/29/12 0500  NA 140 143 138  --   K 4.0 3.8 3.5  --   CL 104 107 104  --   CO2 28 26 22   --   BUN 30* 23 19  --   CREATININE 1.02 0.91 0.82  --   CALCIUM 9.5 9.7 9.7  --   MG 2.1 2.0 1.8 1.9  PHOS 3.3 3.1 3.4 4.0  GLUCOSE 218* 182* 195*  --     CBG (last 3)   Recent Labs  12/28/12 2157 12/29/12 0600 12/29/12 1138  GLUCAP 115* 123* 118*    Scheduled Meds: . heparin  5,000 Units Subcutaneous Q8H  . insulin aspart  0-20 Units Subcutaneous TID WC  . insulin aspart  0-5 Units Subcutaneous QHS  . insulin  glargine  15 Units Subcutaneous Daily  . neomycin-bacitracin-polymyxin   Topical BID  . pantoprazole  40 mg Oral Q1200  . piperacillin-tazobactam (ZOSYN)  IV  3.375 g Intravenous Q8H    Continuous Infusions: . sodium chloride 20 mL/hr at 12/28/12 1658    Maureen Chatters, RD, LDN Pager #: [phone] After-Hours Pager #: [phone]

## 2012-12-29 NOTE — Progress Notes (Signed)
RT assisted patient with CPAP. Set to 6cm H2O min and 20cm H2O max with nasal mask. Sterile water added to fill line. Patient states she is comfortable. RT encouraged her to call if she needs any more assistance.

## 2012-12-30 LAB — GLUCOSE, CAPILLARY
Glucose-Capillary: 130 mg/dL — ABNORMAL HIGH (ref 70–99)
Glucose-Capillary: 108 mg/dL — ABNORMAL HIGH (ref 70–99)

## 2012-12-30 LAB — CBC WITH DIFFERENTIAL/PLATELET
Basophils Absolute: 0.1 10*3/uL (ref 0.0–0.1)
HCT: 27.9 % — ABNORMAL LOW (ref 36.0–46.0)
Lymphs Abs: 2.2 10*3/uL (ref 0.7–4.0)
MCH: 27.1 pg (ref 26.0–34.0)
MCHC: 31.5 g/dL (ref 30.0–36.0)
MCV: 85.8 fL (ref 78.0–100.0)
Monocytes Absolute: 0.7 10*3/uL (ref 0.1–1.0)
Monocytes Relative: 8 % (ref 3–12)
Neutro Abs: 5.7 10*3/uL (ref 1.7–7.7)
Platelets: 530 10*3/uL — ABNORMAL HIGH (ref 150–400)
RDW: 14 % (ref 11.5–15.5)
WBC: 9 10*3/uL (ref 4.0–10.5)

## 2012-12-30 LAB — BASIC METABOLIC PANEL
CO2: 20 mEq/L (ref 19–32)
Chloride: 104 mEq/L (ref 96–112)
Creatinine, Ser: 0.98 mg/dL (ref 0.50–1.10)
Glucose, Bld: 142 mg/dL — ABNORMAL HIGH (ref 70–99)

## 2012-12-30 LAB — MAGNESIUM: Magnesium: 1.9 mg/dL (ref 1.5–2.5)

## 2012-12-30 MED ORDER — INSULIN ASPART 100 UNIT/ML ~~LOC~~ SOLN: 0.0000 [IU] | Freq: Three times a day (TID) | SUBCUTANEOUS | Status: DC

## 2012-12-30 MED ORDER — METFORMIN HCL 500 MG PO TABS
1000.0000 mg | ORAL_TABLET | Freq: Two times a day (BID) | ORAL | Status: DC
  Filled 2012-12-30 (×3): qty 2

## 2012-12-30 MED ORDER — FENTANYL CITRATE 0.05 MG/ML IJ SOLN: 25.0000 ug | INTRAMUSCULAR | Status: DC | PRN

## 2012-12-30 MED ORDER — HYDROCODONE-ACETAMINOPHEN 5-325 MG PO TABS: 1.0000 | ORAL_TABLET | ORAL | Status: DC | PRN

## 2012-12-30 NOTE — Progress Notes (Signed)
[facility] to provide Nursing, Physical and Occupational Therapy at home.  Phone:  [phone]

## 2012-12-30 NOTE — Progress Notes (Addendum)
PULMONARY  / CRITICAL CARE MEDICINE  Name: Erin Schultz MRN: [id number] DOB: 1966/10/30    ADMISSION DATE:  12/15/2012 CONSULTATION DATE:  12/16/12  REFERRING MD :  Donell Beers ([facility]S)  CHIEF COMPLAINT:  Abdominal pain  BRIEF PATIENT DESCRIPTION: 47 y/o morbidly obese female was admitted on 12/15/12 with an incarcerated abdominal wall hernia and perforated small bowel requiring emergent surgery.  PCCM consulted for post op vent management.  SIGNIFICANT EVENTS / STUDIES:  2/3 - CT abdomen>> large anterior abdominal wall hernia, perforation, and complex fluid collection deep to the bowel; small L kidney, likely poorly functioning R kidney given lack of contrast.  Fatty infiltration of liver 2/3 -  ex-lap, lysis of adhesions, perforation repair, 25cm of bowel resection 2/4 - ARF, limited urine output 2/5 - OR for reexploration - No evidence of infection, ischemia, or leak. Unable to close the fascia due to edema.  2/6 - neg balance, hd cath removed 2/8 - TNA started, net neg 3600 L 2/9 - stable, VAC remains in place 2/12 - closed wound with biologic mesh, still with wound vac  2/15 transfer to tele 2/18 D/C Tele.   LINES / TUBES: 2/3 ETT >> 2/13 2/3 R IJ CVL >> 2/10 2/3 radial a-line >> out 2/4 left ij HD>>>2/6 2/10 Lt IJ CVL >> 2/18  CULTURES: 2/4 blood >>negative 2/3 urine >>multiple bacterial morphotypes  ANTIBIOTICS: 2/4 Ertapenem >>2/4 2/4 Micafungin>>>2/8 2/4 vancomycin>>>2/6 2/4 Imipenem>>>2/7 2/7 zosyn>>>2-17  SUBJECTIVE: Poorly tolerant to CPAP. O/W no new complaints. No distress  VITAL SIGNS: Temp:  [98 F (36.7 C)-98.5 F (36.9 C)] 98 F (36.7 C) (02/18 1332) Pulse Rate:  [72-95] 84 (02/18 1332) Resp:  [19-20] 19 (02/18 1332) BP: (135-139)/(72-75) 139/72 mmHg (02/18 1332) SpO2:  [96 %-98 %] 98 % (02/18 1332)  INTAKE / OUTPUT: Intake/Output     02/17 0701 - 02/18 0700 02/18 0701 - 02/19 0700   P.O. 720 480   I.V. (mL/kg)     IV Piggyback     TPN     Total  Intake(mL/kg) 720 (5) 480 (3.3)   Urine (mL/kg/hr) 1300 (0.4) 1600 (1.7)   Drains     Stool 3 (0) 2 (0)   Total Output 1303 1602   Net -583 -1122          PHYSICAL EXAMINATION:  Gen: No distress, morbidly obese HEENT: WNL PULM: Clear CV: regular AB: obese, wound vac in place Ext: no edema Neuro: intact   LABS:  Recent Labs Lab 12/28/12 0825 12/29/12 0500 12/30/12 0650  HGB 8.3* 8.1* 8.8*  HCT 26.4* 25.6* 27.9*  WBC 13.1* 10.2 9.0  PLT 501* 480* 530*    Recent Labs Lab 12/25/12 0400 12/26/12 0410 12/27/12 0405 12/28/12 0825 12/29/12 0500 12/30/12 0650  NA 139 140 143 138  --  137  K 4.1 4.0 3.8 3.5  --  4.0  CL 101 104 107 104  --  104  CO2 29 28 26 22   --  20  GLUCOSE 140* 218* 182* 195*  --  142*  BUN 36* 30* 23 19  --  14  CREATININE 1.30* 1.02 0.91 0.82  --  0.98  CALCIUM 9.3 9.5 9.7 9.7  --  9.5  MG 1.7 2.1 2.0 1.8 1.9 1.9  PHOS 3.6 3.3 3.1 3.4 4.0 3.6   Imaging: No results found.  CBG (last 3)   Recent Labs  12/29/12 2107 12/30/12 0609 12/30/12 1112  GLUCAP 113* 130* 108*     Assessment/Plan:  A: Strangulated/perforated abdominal hernia s/p repair. P: Cont enteral diet Post-op care per [facility] D/C SUP  A: Possible OSA - likely mild  Intolerant to CPCP P: D/C CPAP Will need outpt PSG   A: Hx of HTN P: Controlled off meds   A: Acute renal insufficiency - resolved  P: Monitor   A: Anemia - no evidence of acute blood loss. No indication for PRBCs P: Monitor intermttently. Minimize phlebotomy   A: DM type II. CBG (last 3)  P: Resume metformin D/C Lantus Cont SSI   A: Deconditioning. P: Continue working with PT/OT    Billy Fischer, MD ; [facility] service [place] [phone].  After 5:30 PM or weekends, call [phone]

## 2012-12-30 NOTE — Discharge Summary (Signed)
Physician Discharge Summary  Patient ID: Erin Schultz MRN: [id number] DOB/AGE: 1966/10/13 47 y.o.  Admit date: 12/15/2012 Discharge date: 12/30/2012  Problem List Principal Problem:   Septic shock(785.52) Active Problems:   Small bowel obstruction   Acute respiratory failure with hypoxia   Hernia, umbilical, with gangrene   Acute peritonitis   Bowel perforation   Severe sepsis(995.92)   Protein-calorie malnutrition, moderate  HPI: 47 y/o morbidly obese female was admitted on 12/15/12 with an incarcerated abdominal wall hernia and perforated small bowel requiring emergent surgery. PCCM consulted for post op vent management.  Hospital Course:  SIGNIFICANT EVENTS / STUDIES:  2/3 - CT abdomen>> large anterior abdominal wall hernia, perforation, and complex fluid collection deep to the bowel; small L kidney, likely poorly functioning R kidney given lack of contrast. Fatty infiltration of liver  2/3 - ex-lap, lysis of adhesions, perforation repair, 25cm of bowel resection  2/4 - ARF, limited urine output  2/5 - OR for reexploration - No evidence of infection, ischemia, or leak. Unable to close the fascia due to edema.  2/6 - neg balance, hd cath removed  2/8 - TNA started, net neg 3600 L  2/9 - stable, VAC remains in place  2/12 - closed wound with biologic mesh, still with wound vac  2/15 transfer to tele  2/18 D/C Tele.  LINES / TUBES:  2/3 ETT >> 2/13  2/3 R IJ CVL >> 2/10 2/3 radial a-line >> out  2/4 left ij HD>>>2/6  2/10 Lt IJ CVL >> 2/18  CULTURES:  2/4 blood >>negative  2/3 urine >>multiple bacterial morphotypes  ANTIBIOTICS:  2/4 Ertapenem >>2/4  2/4 Micafungin>>>2/8  2/4 vancomycin>>>2/6  2/4 Imipenem>>>2/7  2/7 zosyn>>>2-17  SUBJECTIVE:  Poorly tolerant to CPAP. O/W no new complaints. No distress   Assessment/Plan:  A: Strangulated/perforated abdominal hernia s/p repair.  P:  Cont enteral diet  Post-op care per [facility]  D/C SUP  A: Possible OSA - likely mild   Intolerant to CPCP  Follow up with Dr Craige Cotta for sleep evaluation. P:  D/C CPAP  Will need outpt PSG  A: Hx of HTN  P:  Controlled off meds  A: Acute renal insufficiency - resolved  P:  Monitor  A: Anemia - no evidence of acute blood loss. No indication for PRBCs  P:  Monitor intermttently. Minimize phlebotomy  A: DM type II.  CBG (last 3)  P:  Resume metformin  D/C Lantus  Cont SSI  A: Deconditioning.  P:  Continue working with PT/OT        Labs at discharge Lab Results  Component Value Date   CREATININE 0.98 12/30/2012   BUN 14 12/30/2012   NA 137 12/30/2012   K 4.0 12/30/2012   CL 104 12/30/2012   CO2 20 12/30/2012   Lab Results  Component Value Date   WBC 9.0 12/30/2012   HGB 8.8* 12/30/2012   HCT 27.9* 12/30/2012   MCV 85.8 12/30/2012   PLT 530* 12/30/2012   Lab Results  Component Value Date   ALT 24 12/27/2012   AST 30 12/27/2012   ALKPHOS 102 12/27/2012   BILITOT 0.2* 12/27/2012   Lab Results  Component Value Date   INR 1.35 12/17/2012   INR 1.20 12/15/2012    Current radiology studies No results found.  Disposition:  01-Home or Self Care   Future Appointments Provider Department Dept Phone   01/07/2013 2:45 PM Barnabas Lister, [place] [facility] [phone]   01/15/2013  9:15 AM Coralyn Helling, MD [facility] [phone]   01/20/2013 2:45 PM Almond Lint, MD [facility], [place] [phone]       Medication List    STOP taking these medications       amLODipine-benazepril 5-40 MG per capsule  Commonly known as:  LOTREL      TAKE these medications       fexofenadine 180 MG tablet  Commonly known as:  ALLEGRA  Take 180 mg by mouth daily as needed. For seasonal allergies     HYDROcodone-acetaminophen 5-325 MG per tablet  Commonly known as:  NORCO/VICODIN  Take 1-2 tablets by mouth every 4 (four) hours as needed.     insulin aspart 100 UNIT/ML injection  Commonly known as:  novoLOG  Inject 0-20 Units into the  skin 3 (three) times daily with meals.     metFORMIN 1000 MG tablet  Commonly known as:  GLUCOPHAGE  TAKE 1 TABLET BY MOUTH TWICE A DAY WITH MEALS     multivitamin with minerals Tabs  Take 1 tablet by mouth daily.     triamterene-hydrochlorothiazide 37.5-25 MG per tablet  Commonly known as:  MAXZIDE-25  Take 1 tablet by mouth daily.           Follow-up Information   Follow up with [facility], MD. Schedule an appointment as soon as possible for a visit in 2 weeks.   Contact information:   [address] [place] [address] [phone]       Follow up with Coralyn Helling, MD On 01/15/2013. (9:15 am for sleep evaluation.)    Contact information:   [address] [place] [address] [phone]       Follow up with DE LA CRUZ,IVY, DO On 01/07/2013. (2:45 pm)    Contact information:   [address] [place] [address] [phone]       Discharged Condition: good Vital signs at Discharge. Temp:  [98 F (36.7 C)-98.5 F (36.9 C)] 98 F (36.7 C) (02/18 1332) Pulse Rate:  [72-95] 84 (02/18 1332) Resp:  [19-20] 19 (02/18 1332) BP: (135-139)/(72-75) 139/72 mmHg (02/18 1332) SpO2:  [96 %-98 %] 98 % (02/18 1332) Office follow up Special Information or instructions. Signed: Brett Canales Minor ACNP Adolph Pollack PCCM Pager [phone] till 3 pm If no answer page [phone] 12/30/2012, 2:02 PM   Agree Merwyn Katos, MD

## 2012-12-30 NOTE — Progress Notes (Signed)
IJ d/ced per MD order and protocol. Occlusive dressing applied. Pt resting in bed with family at bedside. Will continue to monitor

## 2012-12-30 NOTE — Progress Notes (Signed)
I have seen and examined the patient and agree with the assessment and plans.  Duc Crocket A. Nalaya Wojdyla  MD, FACS  

## 2012-12-30 NOTE — Progress Notes (Signed)
6 Days Post-Op  Subjective: Pt feeling great today, having good BM's and passing flatus, minimal abdominal pain.  Ambulating well with PT/OT will be able to go home with [facility].  Pt tolerating carb modified diet.  No other problems/concerns.  Objective: Vital signs in last 24 hours: Temp:  [97.5 F (36.4 C)-98.5 F (36.9 C)] 98.5 F (36.9 C) (02/18 0419) Pulse Rate:  [72-82] 72 (02/18 0419) Resp:  [19-20] 20 (02/18 0419) BP: (120-136)/(68-75) 136/74 mmHg (02/18 0419) SpO2:  [96 %-100 %] 97 % (02/18 0419) Last BM Date: 12/29/12  Intake/Output from previous day: 02/17 0701 - 02/18 0700 In: 720 [P.O.:720] Out: 1303 [Urine:1300; Stool:3] Intake/Output this shift:    PE: Gen:  Alert, NAD, pleasant Abd: Soft, NT, large facial defect with wound vac in place over mesh, +BS   Lab Results:   Recent Labs  12/29/12 0500 12/30/12 0650  WBC 10.2 9.0  HGB 8.1* 8.8*  HCT 25.6* 27.9*  PLT 480* 530*   BMET  Recent Labs  12/28/12 0825  NA 138  K 3.5  CL 104  CO2 22  GLUCOSE 195*  BUN 19  CREATININE 0.82  CALCIUM 9.7   PT/INR No results found for this basename: LABPROT, INR,  in the last 72 hours CMP     Component Value Date/Time   NA 138 12/28/2012 0825   K 3.5 12/28/2012 0825   CL 104 12/28/2012 0825   CO2 22 12/28/2012 0825   GLUCOSE 195* 12/28/2012 0825   BUN 19 12/28/2012 0825   CREATININE 0.82 12/28/2012 0825   CREATININE 0.89 06/03/2012 1546   CALCIUM 9.7 12/28/2012 0825   PROT 6.4 12/27/2012 0405   ALBUMIN 2.0* 12/27/2012 0405   AST 30 12/27/2012 0405   ALT 24 12/27/2012 0405   ALKPHOS 102 12/27/2012 0405   BILITOT 0.2* 12/27/2012 0405   GFRNONAA 84* 12/28/2012 0825   GFRAA >90 12/28/2012 0825   Lipase     Component Value Date/Time   LIPASE 41 12/15/2012 1323       Studies/Results: No results found.  Anti-infectives: Anti-infectives   Start     Dose/Rate Route Frequency Ordered Stop   12/19/12 1000  piperacillin-tazobactam (ZOSYN) IVPB 3.375 g     3.375  g 12.5 mL/hr over 240 Minutes Intravenous 3 times per day 12/19/12 0940 12/30/12 0113   12/18/12 1800  imipenem-cilastatin (PRIMAXIN) 500 mg in sodium chloride 0.9 % 100 mL IVPB  Status:  Discontinued     500 mg 200 mL/hr over 30 Minutes Intravenous 4 times per day 12/18/12 1402 12/19/12 0923   12/18/12 0900  vancomycin (VANCOCIN) 2,000 mg in sodium chloride 0.9 % 500 mL IVPB  Status:  Discontinued     2,000 mg 250 mL/hr over 120 Minutes Intravenous Every 48 hours 12/16/12 0927 12/17/12 1236   12/17/12 1400  vancomycin (VANCOCIN) 2,000 mg in sodium chloride 0.9 % 500 mL IVPB  Status:  Discontinued     2,000 mg 250 mL/hr over 120 Minutes Intravenous Every 24 hours 12/17/12 1236 12/18/12 1005   12/17/12 1000  micafungin (MYCAMINE) 100 mg in sodium chloride 0.9 % 100 mL IVPB     100 mg 100 mL/hr over 1 Hours Intravenous Daily 12/16/12 0908 12/20/12 1059   12/16/12 2300  ertapenem (INVANZ) 1 g in sodium chloride 0.9 % 50 mL IVPB  Status:  Discontinued     1 g 100 mL/hr over 30 Minutes Intravenous Every 24 hours 12/16/12 0230 12/16/12 0908   12/16/12 1200  imipenem-cilastatin (PRIMAXIN) 250 mg in sodium chloride 0.9 % 100 mL IVPB  Status:  Discontinued     250 mg 200 mL/hr over 30 Minutes Intravenous 4 times per day 12/16/12 0927 12/18/12 1402   12/16/12 1000  micafungin (MYCAMINE) 100 mg in sodium chloride 0.9 % 100 mL IVPB     100 mg 100 mL/hr over 1 Hours Intravenous Daily 12/16/12 0908 12/16/12 1239   12/16/12 0930  vancomycin (VANCOCIN) 2,500 mg in sodium chloride 0.9 % 500 mL IVPB     2,500 mg 250 mL/hr over 120 Minutes Intravenous  Once 12/16/12 0927 12/16/12 1334   12/16/12 0600  ertapenem (INVANZ) 1 g in sodium chloride 0.9 % 50 mL IVPB     1 g 100 mL/hr over 30 Minutes Intravenous On call to O.R. 12/15/12 2129 12/15/12 2254   12/15/12 2015  [MAR Hold]  piperacillin-tazobactam (ZOSYN) IVPB 3.375 g     (On MAR Hold since 12/15/12 2236)   3.375 g 12.5 mL/hr over 240 Minutes  Intravenous  Once 12/15/12 2006 12/16/12 0034       Assessment/Plan 1. EXPLORATORY LAPAROTOMY, enterolysis, small bowel resection - R. Byerly - 12/16/2012   ABDOMINAL VACUUM ASSISTED CLOSURE CHANGE - H. Derrell Lolling - 12/17/12 and 12/19/12   Fascial closure with Flex HD - D. Newman - 12/24/2012   Over all doing well. VAC change M, W, F.  Using binder to try to support her abdomen.  2. Morbid obesity & DM 3. Severe malnutrition - tolerating carb control diet  4. Loose stools - resolved 5. Zosyn discontinued after 10 day course 6. DVT prophylaxis - on SQ Heparin  7. Foley - urinating normally   Disp: Pt surgically stable for discharge home with [facility] PT/OT/RN when medically stable. No ABX needed since has had 10/10 days.  Scheduled f/u with Dr. Donell Beers on 2/28 @10 :30am, arrive at 10:00 for check in.       LOS: 15 days    DORT, Aundra Millet 12/30/2012, 7:41 AM Pager: [phone]

## 2012-12-30 NOTE — Progress Notes (Signed)
Physical Therapy Treatment Patient Details Name: Erin Schultz MRN: [id number] DOB: 03-28-1966 Today's Date: 12/30/2012 Time: 4098-1191 PT Time Calculation (min): 15 min  PT Assessment / Plan / Recommendation Comments on Treatment Session  Pt will good progress, mod I with gait and transfers.  continues to be limited by decreased activity tolerance, requiring frequent standing rest breaks.  Requires assist with stairs due to fatigue and mm weakness.  Continue to recommend d/c home with HHPT for strength and conditioning.    Follow Up Recommendations  Home health PT;Supervision - Intermittent           Equipment Recommendations  Rolling walker with 5" wheels       Frequency Min 3X/week   Plan Discharge plan remains appropriate    Precautions / Restrictions Precautions Precaution Comments: wound vac Restrictions Weight Bearing Restrictions: No   Pertinent Vitals/Pain No c/o pain, HR 95 bpm with activity, returned to 78 bpm with rest.    Mobility  Transfers Sit to Stand: 6: Modified independent (Device/Increase time) Stand to Sit: 6: Modified independent (Device/Increase time) Details for Transfer Assistance: with RW Ambulation/Gait Ambulation/Gait Assistance: 6: Modified independent (Device/Increase time) Ambulation Distance (Feet): 300 Feet Assistive device: Rolling walker Ambulation/Gait Assistance Details: pt with slow cadence, frequent rest breaks to slow HR (95 bpm with activity), no LOB Stairs: Yes Stairs Assistance: 4: Min assist Stairs Assistance Details (indicate cue type and reason): supervision to ascend stairs with 1 handrail, required min A for support and steadying with descending stairs Stair Management Technique: One rail Right;Forwards;Step to pattern Number of Stairs: 3      PT Goals Acute Rehab PT Goals PT Goal: Sit to Stand - Progress: Met PT Transfer Goal: Bed to Chair/Chair to Bed - Progress: Met PT Goal: Ambulate - Progress: Met PT Goal:  Up/Down Stairs - Progress: Met  Visit Information  Last PT Received On: 12/30/12 Assistance Needed: +1    Subjective Data  Subjective: I'm tired but I want to try the stairs Patient Stated Goal: go home   Cognition  Cognition Overall Cognitive Status: Appears within functional limits for tasks assessed/performed       End of Session PT - End of Session Equipment Utilized During Treatment: Gait belt Activity Tolerance: Patient tolerated treatment well Patient left: in chair;with call bell/phone within reach Nurse Communication: Mobility status   GP     Shadara Lopez 12/30/2012, 10:09 AM

## 2012-12-31 ENCOUNTER — Telehealth: Payer: Self-pay | Admitting: Pulmonary Disease

## 2012-12-31 ENCOUNTER — Telehealth (INDEPENDENT_AMBULATORY_CARE_PROVIDER_SITE_OTHER): Payer: Self-pay | Admitting: *Deleted

## 2012-12-31 ENCOUNTER — Telehealth (INDEPENDENT_AMBULATORY_CARE_PROVIDER_SITE_OTHER): Payer: Self-pay

## 2012-12-31 NOTE — Telephone Encounter (Signed)
Pt given po appt and directions to our office, 01/09/13 at 10:30am.

## 2012-12-31 NOTE — Telephone Encounter (Signed)
Spoke with pharmacist and advised contact PCP for this since we will not be following the pt for DM She verbalized understanding and states nothing further needed

## 2012-12-31 NOTE — Telephone Encounter (Signed)
Corett from [facility] called to ask for clarification on a discharge order for Insulin for this patient.  The order was prescribed by Ross Marcus MD so this RN spoke to Dr. Park Breed who stated the order was for hospital only and should have been cancelled upon discharge.  Corett was updated with the information.

## 2012-12-31 NOTE — Telephone Encounter (Signed)
Office is already closed  [facility] tomorrow

## 2013-01-01 NOTE — Telephone Encounter (Signed)
Spoke with Haig Prophet and she states that she already spoke with Dr Sung Amabile and this matter has been handled Nothing further needed

## 2013-01-07 ENCOUNTER — Telehealth (INDEPENDENT_AMBULATORY_CARE_PROVIDER_SITE_OTHER): Payer: Self-pay

## 2013-01-07 ENCOUNTER — Encounter: Payer: Self-pay | Admitting: Family Medicine

## 2013-01-07 ENCOUNTER — Ambulatory Visit (INDEPENDENT_AMBULATORY_CARE_PROVIDER_SITE_OTHER): Payer: BC Managed Care – PPO | Admitting: Family Medicine

## 2013-01-07 VITALS — BP 141/76 | HR 97 | Temp 98.0°F | Ht 67.0 in | Wt 326.1 lb

## 2013-01-07 DIAGNOSIS — E119 Type 2 diabetes mellitus without complications: Secondary | ICD-10-CM

## 2013-01-07 DIAGNOSIS — I10 Essential (primary) hypertension: Secondary | ICD-10-CM

## 2013-01-07 MED ORDER — MUPIROCIN 2 % EX OINT: TOPICAL_OINTMENT | Freq: Three times a day (TID) | CUTANEOUS | Status: DC

## 2013-01-07 MED ORDER — GLIPIZIDE 10 MG PO TABS: 10.0000 mg | ORAL_TABLET | Freq: Two times a day (BID) | ORAL | Status: DC

## 2013-01-07 NOTE — Assessment & Plan Note (Signed)
BP acceptable range.  Continue Maxzide 25 mg daily.  Continue to hold Amlodipine.

## 2013-01-07 NOTE — Progress Notes (Signed)
  Subjective:    Patient ID: Erin Schultz, female    DOB: May 25, 1966, 47 y.o.   MRN: [id number]  HPI  Patient presents to clinic for hospital follow up.  Briefly, patient was admitted for strangulated ventral incisional hernia with bowel obstruction and perforation of small bowel.  Patient underwent ex-lap with lysis of adhesions, small bowel resection, and abdominal wound vac.    She was transferred to ICU post op for vent management and was discharged from ICU on 12/30/12.  She has follow up appointments with Dr. Donell Beers this week and Dr. Craige Cotta in March.  Today, patient says is feeling better.  Pain is well controlled on Vicodin.  She does not take it everyday.  Her appetite is slowly improving.  She is trying to eat foods high in protein, low in carbohydrates.  She checks CBG TID prior to meals, lowest CBG 149 and highest CBG 267.  Currently on Metformin 1000 BID.  She is supposed to be on insulin as well, but she did not want to do this yet.  Patient denies any HA, nausea/vomiting, diaphoresis.  Denies any paresthesias.  She wants to know if she should drink Glucerna shakes and has other questions about her diet.  Review of Systems Per HPI    Objective:   Physical Exam  Constitutional: She appears well-nourished. No distress.  Cardiovascular: Normal rate, regular rhythm and normal heart sounds.   Pulmonary/Chest: Effort normal and breath sounds normal. No respiratory distress. She has no wheezes. She has no rales.  Abdominal:  Wound vac in place, wearing binder  Skin:  Low back: superficial abrasion about 4 cm wide, mild erythema, but non tender on palpation; no swelling or signs of infection      Assessment & Plan:

## 2013-01-07 NOTE — Patient Instructions (Addendum)
It was so good to see you today, Erin Schultz.  I am glad you are recovering well. For your diabetes, you can add Glipizide 10 mg twice per day for better control. I will refer you to Diabetes Education at [facility] to help with Nutrition while you are still in recovery. Apply Bactroban ointment to your skin lesion on your back twice per day until it gets better. Schedule follow up appointment with me in ONE month or sooner as needed.  Diet Recommendations for Diabetes   Starchy (carb) foods include: Bread, rice, pasta, potatoes, corn, crackers, bagels, muffins, all baked goods.   Protein foods include: Meat, fish, poultry, eggs, dairy foods, and beans such as pinto and kidney beans (beans also provide carbohydrate).   1. Eat at least 3 meals and 1-2 snacks per day. Never go more than 4-5 hours while awake without eating.  2. Limit starchy foods to TWO per meal and ONE per snack. ONE portion of a starchy  food is equal to the following:   - ONE slice of bread (or its equivalent, such as half of a hamburger bun).   - 1/2 cup of a "scoopable" starchy food such as potatoes or rice.   - 1 OUNCE (28 grams) of starchy snack foods such as crackers or pretzels (look on label).  3. Both lunch and dinner should include a protein food, a carb food, and vegetables.   - Obtain twice as many veg's as protein or carbohydrate foods for both lunch and dinner.   - Try to keep frozen veg's on hand for a quick vegetable serving.     - Fresh or frozen veg's are best.  4. Breakfast should always include protein.

## 2013-01-07 NOTE — Telephone Encounter (Signed)
Home health nurse calling to report that when she changed the wound vac today there was an odor but once the wound cleaned there was no odor. The wound looks good with beefy red tissue. The pt is doing fine. The pt has an appt this Friday with Dr Donell Beers and just wanted to report the odor.

## 2013-01-09 ENCOUNTER — Encounter (INDEPENDENT_AMBULATORY_CARE_PROVIDER_SITE_OTHER): Payer: BC Managed Care – PPO | Admitting: General Surgery

## 2013-01-09 ENCOUNTER — Encounter (INDEPENDENT_AMBULATORY_CARE_PROVIDER_SITE_OTHER): Payer: Self-pay | Admitting: General Surgery

## 2013-01-09 ENCOUNTER — Ambulatory Visit (INDEPENDENT_AMBULATORY_CARE_PROVIDER_SITE_OTHER): Payer: BC Managed Care – PPO | Admitting: General Surgery

## 2013-01-09 VITALS — BP 136/82 | HR 74 | Temp 98.1°F | Resp 18 | Ht 67.0 in | Wt 320.0 lb

## 2013-01-09 DIAGNOSIS — K421 Umbilical hernia with gangrene: Secondary | ICD-10-CM

## 2013-01-09 NOTE — Progress Notes (Signed)
HISTORY: Patient is doing well. She has been discharged to the hospital. She is walking with a walker and working with physical therapy. She is still very weak. She is having bowel movements and is eating okay. Her home health agency called and stated that her wound had an odor. They cleaned it and replace the VAC and seemed much better.    EXAM: General:  Alert and oriented Incision:  Wound vac in place.  Vac changed.  Some purulent material in upper portion of biologic mesh.    PATHOLOGY: Small intestine, resection, small bowel - BENIGN SMALL BOWEL PROLIFERATION WITH TRANSMURAL ISCHEMIC INJURY, INFLAMMATORY SEROSAL EXUDATE, AND SEROSAL ADHESIONS. - NEGATIVE FOR DYSPLASIA OR MALIGNANCY. - FOUR BENIGN LYMPH NODES (0/4).   ASSESSMENT AND PLAN:   Hernia, umbilical, with gangrene Patient is doing well overall. Still deconditioned. She will need to continue working with physical therapy and continue wound VAC changes.  I will see her back in around 4 weeks to evaluate the wound.      Maudry Diego, MD Surgical Oncology, General & Endocrine Surgery [facility]  DE LA CRUZ,IVY, DO [place], [place], [place]

## 2013-01-09 NOTE — Assessment & Plan Note (Signed)
Patient is doing well overall. Still deconditioned. She will need to continue working with physical therapy and continue wound VAC changes.  I will see her back in around 4 weeks to evaluate the wound.

## 2013-01-09 NOTE — Patient Instructions (Signed)
Continue wound VAC changes. Followup in 4 weeks for wound check.  Continue working with physical therapy.

## 2013-01-15 ENCOUNTER — Inpatient Hospital Stay: Payer: BC Managed Care – PPO | Admitting: Pulmonary Disease

## 2013-01-19 ENCOUNTER — Telehealth (INDEPENDENT_AMBULATORY_CARE_PROVIDER_SITE_OTHER): Payer: Self-pay | Admitting: *Deleted

## 2013-01-19 NOTE — Telephone Encounter (Signed)
Gaylyn Rong with [facility] called to ask about continuing the order for them to go out for the wound vac.  Patient was seen on Saturday rather than Friday due to weather.  Instructed Gaylyn Rong that patient is being seen tomorrow so a new order will be given once Donell Beers MD has evaluated patient tomorrow.  Please call Gaylyn Rong at [phone] to let her know and fax order to [phone].

## 2013-01-20 ENCOUNTER — Encounter (INDEPENDENT_AMBULATORY_CARE_PROVIDER_SITE_OTHER): Payer: Self-pay | Admitting: General Surgery

## 2013-01-20 ENCOUNTER — Ambulatory Visit (INDEPENDENT_AMBULATORY_CARE_PROVIDER_SITE_OTHER): Payer: BC Managed Care – PPO | Admitting: General Surgery

## 2013-01-20 VITALS — BP 142/88 | HR 106 | Temp 96.2°F | Resp 20 | Ht 66.0 in | Wt 320.4 lb

## 2013-01-20 DIAGNOSIS — K631 Perforation of intestine (nontraumatic): Secondary | ICD-10-CM

## 2013-01-20 NOTE — Progress Notes (Signed)
HISTORY: Patient is doing well.  Her energy is improving.  She has been working with PT.  Her wound still has odor, but minimal drainage.     EXAM: General:  Alert and oriented Incision:  Wound vac in place.  Vac changed.  Biologic mesh trimmed at portion that was not adherent.  Foul odor, but no purulent material anywhere.    PATHOLOGY: Small intestine, resection, small bowel - BENIGN SMALL BOWEL PROLIFERATION WITH TRANSMURAL ISCHEMIC INJURY, INFLAMMATORY SEROSAL EXUDATE, AND SEROSAL ADHESIONS. - NEGATIVE FOR DYSPLASIA OR MALIGNANCY. - FOUR BENIGN LYMPH NODES (0/4).   ASSESSMENT AND PLAN:   Bowel perforation Continue vac changes.  Follow up in 3-4 weeks.  Continue weaning pain meds.         Maudry Diego, MD Surgical Oncology, General & Endocrine Surgery [facility]  DE LA CRUZ,IVY, DO de [place], [place], [place]

## 2013-01-20 NOTE — Assessment & Plan Note (Signed)
Continue vac changes.  Follow up in 3-4 weeks.  Continue weaning pain meds.

## 2013-01-20 NOTE — Patient Instructions (Addendum)
Continue vac changes.  Follow up in 3-4 weeks.

## 2013-01-26 ENCOUNTER — Encounter (INDEPENDENT_AMBULATORY_CARE_PROVIDER_SITE_OTHER): Payer: BC Managed Care – PPO | Admitting: Surgery

## 2013-01-26 ENCOUNTER — Telehealth (INDEPENDENT_AMBULATORY_CARE_PROVIDER_SITE_OTHER): Payer: Self-pay | Admitting: *Deleted

## 2013-01-26 NOTE — Telephone Encounter (Signed)
Gaylyn Rong with Home Health called to state that patients wound is "ripping" open.  She suggested that patient be seen in Urgent Care.  Appt was made in urgent care for this afternoon.

## 2013-01-27 ENCOUNTER — Encounter (INDEPENDENT_AMBULATORY_CARE_PROVIDER_SITE_OTHER): Payer: Self-pay | Admitting: Surgery

## 2013-01-27 ENCOUNTER — Ambulatory Visit (INDEPENDENT_AMBULATORY_CARE_PROVIDER_SITE_OTHER): Payer: BC Managed Care – PPO | Admitting: Surgery

## 2013-01-27 VITALS — BP 132/82 | HR 84 | Resp 18 | Ht 66.0 in | Wt 324.0 lb

## 2013-01-27 DIAGNOSIS — S31109A Unspecified open wound of abdominal wall, unspecified quadrant without penetration into peritoneal cavity, initial encounter: Secondary | ICD-10-CM | POA: Insufficient documentation

## 2013-01-27 DIAGNOSIS — S31109D Unspecified open wound of abdominal wall, unspecified quadrant without penetration into peritoneal cavity, subsequent encounter: Secondary | ICD-10-CM

## 2013-01-27 DIAGNOSIS — Z5189 Encounter for other specified aftercare: Secondary | ICD-10-CM

## 2013-01-27 NOTE — Progress Notes (Signed)
Erin Schultz 48 y.o.  Body mass index is 52.32 kg/(m^2).  Patient Active Problem List  Diagnosis  . DIABETES MELLITUS, TYPE II  . HYPERLIPIDEMIA  . OBESITY, NOS  . HYPERTENSION, BENIGN SYSTEMIC  . RHINITIS, ALLERGIC  . DERMATITIS, ATOPIC  . Small bowel obstruction  . Acute respiratory failure with hypoxia  . Septic shock(785.52)  . Hernia, umbilical, with gangrene  . Acute peritonitis  . Bowel perforation  . Severe sepsis(995.92)  . Protein-calorie malnutrition, moderate    Allergies  Allergen Reactions  . Corn-Containing Products Nausea And Vomiting    Stomach inflamed  . Sulfa Antibiotics     Childhood reaction    Past Surgical History  Procedure Laterality Date  . Cholecystectomy    . Appendectomy    . Abdominal hysterectomy    . Laparotomy  12/15/2012    Procedure: EXPLORATORY LAPAROTOMY;  Surgeon: Almond Lint, MD;  Location: [facility] OR;  Service: General;  Laterality: N/A;  . Vacuum assisted closure change  12/17/2012    Procedure: ABDOMINAL VACUUM ASSISTED CLOSURE CHANGE;  Surgeon: Ernestene Mention, MD;  Location: [facility] OR;  Service: General;  Laterality: N/A;  EX Lap with vac change  . Laparotomy N/A 12/19/2012    Procedure: EXPLORATORY LAPAROTOMY;  Surgeon: Ernestene Mention, MD;  Location: [facility] OR;  Service: General;  Laterality: N/A;  . Vacuum assisted closure change N/A 12/19/2012    Procedure: ABDOMINAL VACUUM ASSISTED CLOSURE CHANGE;  Surgeon: Ernestene Mention, MD;  Location: [facility] OR;  Service: General;  Laterality: N/A;  . Vacuum assisted closure change N/A 12/22/2012    Procedure: ABDOMINAL VACUUM ASSISTED CLOSURE CHANGE;  Surgeon: Kandis Cocking, MD;  Location: [facility] OR;  Service: General;  Laterality: N/A;  . Vacuum assisted closure change N/A 12/24/2012    Procedure: ABDOMINAL VACUUM ASSISTED CLOSURE CHANGE;  Surgeon: Kandis Cocking, MD;  Location: [facility] OR;  Service: General;  Laterality: N/A;   DE LA CRUZ,IVY, DO No diagnosis found.  VAC in place.  Was sent her by [facility] concerns about VAC leaking.  Benzoin and a new adhesive applied to lower wound.  No leak.  No enteric contents.   Would on the outside looks good.  Abdominal binder is now a hinderance--removed.  Advised to shower with wound VAC in place and dry with hair dryer.   Return to see Dr Donell Beers in 2 weeks Matt B. Daphine Deutscher, MD, [facility] [phone] beeper [phone]  01/27/2013 5:00 PM

## 2013-01-27 NOTE — Patient Instructions (Addendum)
May shower with wound VAC

## 2013-01-28 ENCOUNTER — Other Ambulatory Visit: Payer: Self-pay | Admitting: Family Medicine

## 2013-01-28 ENCOUNTER — Telehealth (INDEPENDENT_AMBULATORY_CARE_PROVIDER_SITE_OTHER): Payer: Self-pay | Admitting: *Deleted

## 2013-01-28 ENCOUNTER — Encounter (INDEPENDENT_AMBULATORY_CARE_PROVIDER_SITE_OTHER): Payer: Self-pay

## 2013-01-28 NOTE — Telephone Encounter (Signed)
Amy with [facility] called to inform us she has found  6.5cm tunnel coming off the abdominal open wound that has the wound vac.  Spoke to [place] MD who wants tunnel to be packed.  Amy states she does not have the packing foam today so she will do wet-dry dressing today then try to get out tomorrow to pack tunnel.  Patient is due for a po appt 02/03/13 with Donell Beers MD

## 2013-02-02 ENCOUNTER — Encounter (INDEPENDENT_AMBULATORY_CARE_PROVIDER_SITE_OTHER): Payer: Self-pay | Admitting: General Surgery

## 2013-02-02 ENCOUNTER — Ambulatory Visit (INDEPENDENT_AMBULATORY_CARE_PROVIDER_SITE_OTHER): Payer: BC Managed Care – PPO | Admitting: General Surgery

## 2013-02-02 ENCOUNTER — Telehealth (INDEPENDENT_AMBULATORY_CARE_PROVIDER_SITE_OTHER): Payer: Self-pay

## 2013-02-02 VITALS — BP 142/88 | HR 99 | Temp 97.0°F | Ht 66.0 in | Wt 321.8 lb

## 2013-02-02 DIAGNOSIS — K421 Umbilical hernia with gangrene: Secondary | ICD-10-CM

## 2013-02-02 NOTE — Telephone Encounter (Signed)
Orders to extend [facility] x 3 wks faxed to [facility] this date.

## 2013-02-02 NOTE — Telephone Encounter (Signed)
Kristen from [facility] called and said patients orders for her to come out ends this week. If orders need to be extended for [facility] please fax to [phone]. Patient comes in this morning.

## 2013-02-02 NOTE — Assessment & Plan Note (Signed)
Patient's wound continues to granulate well. Considering that there is no sign of infection and the wound is beefy-red with good granulation tissue, I would not switch her to Dakin's solution.  The wound odor seems to be a little less.  Follow up in 3 weeks.

## 2013-02-02 NOTE — Patient Instructions (Signed)
Continue wound vac  Follow up in 3 weeks.

## 2013-02-02 NOTE — Progress Notes (Signed)
HISTORY: Patient continues to do well.  She continues to have wound odor, but thinks it is less since she got rid of the binder.  She continues to gain energy and mobility.     EXAM: General:  Alert and oriented Incision:  Wound vac in place.  Vac changed.  Beefy red granulation tissue.  Wound continues to shrink.  No purulent drainage.  Small 2 cm tunnel at left inferolateral portion of wound.      PATHOLOGY: Small intestine, resection, small bowel - BENIGN SMALL BOWEL PROLIFERATION WITH TRANSMURAL ISCHEMIC INJURY, INFLAMMATORY SEROSAL EXUDATE, AND SEROSAL ADHESIONS. - NEGATIVE FOR DYSPLASIA OR MALIGNANCY. - FOUR BENIGN LYMPH NODES (0/4).   ASSESSMENT AND PLAN:   Hernia, umbilical, with gangrene Patient's wound continues to granulate well. Considering that there is no sign of infection and the wound is beefy-red with good granulation tissue, I would not switch her to Dakin's solution.  The wound odor seems to be a little less.  Follow up in 3 weeks.         Maudry Diego, MD Surgical Oncology, General & Endocrine Surgery [facility]  DE LA CRUZ,IVY, DO de [place], [place], [place]

## 2013-02-04 ENCOUNTER — Telehealth (INDEPENDENT_AMBULATORY_CARE_PROVIDER_SITE_OTHER): Payer: Self-pay

## 2013-02-04 NOTE — Telephone Encounter (Signed)
Gaylyn Rong with [facility] called to verify orders for patient. Are they suppose to be using mepitel on patients wound? Gaylyn Rong can be reached at [phone].

## 2013-02-06 ENCOUNTER — Telehealth (INDEPENDENT_AMBULATORY_CARE_PROVIDER_SITE_OTHER): Payer: Self-pay

## 2013-02-06 NOTE — Telephone Encounter (Signed)
Do not have to use mepitel.

## 2013-02-06 NOTE — Telephone Encounter (Signed)
LM on nurse's VM not necessary to use mepitel.

## 2013-02-09 ENCOUNTER — Telehealth (INDEPENDENT_AMBULATORY_CARE_PROVIDER_SITE_OTHER): Payer: Self-pay | Admitting: *Deleted

## 2013-02-09 NOTE — Telephone Encounter (Signed)
Spoke to Brandon to let her know Donell Beers MD approved the requested order.

## 2013-02-09 NOTE — Telephone Encounter (Signed)
Erin Schultz with [facility] called to ask for a new order to continue seeing patient 3 times for week for the next 3 weeks.

## 2013-02-09 NOTE — Telephone Encounter (Signed)
Please order continued home health.

## 2013-02-11 ENCOUNTER — Ambulatory Visit: Payer: BC Managed Care – PPO | Admitting: Dietician

## 2013-02-17 IMAGING — CR DG ABDOMEN 2V
3 series · 3 of 3 positions shown · non-contrast
Comparison: None.

CLINICAL DATA: Abdominal pain and nausea

ABDOMEN - 2 VIEW

[w abdomen upright *]
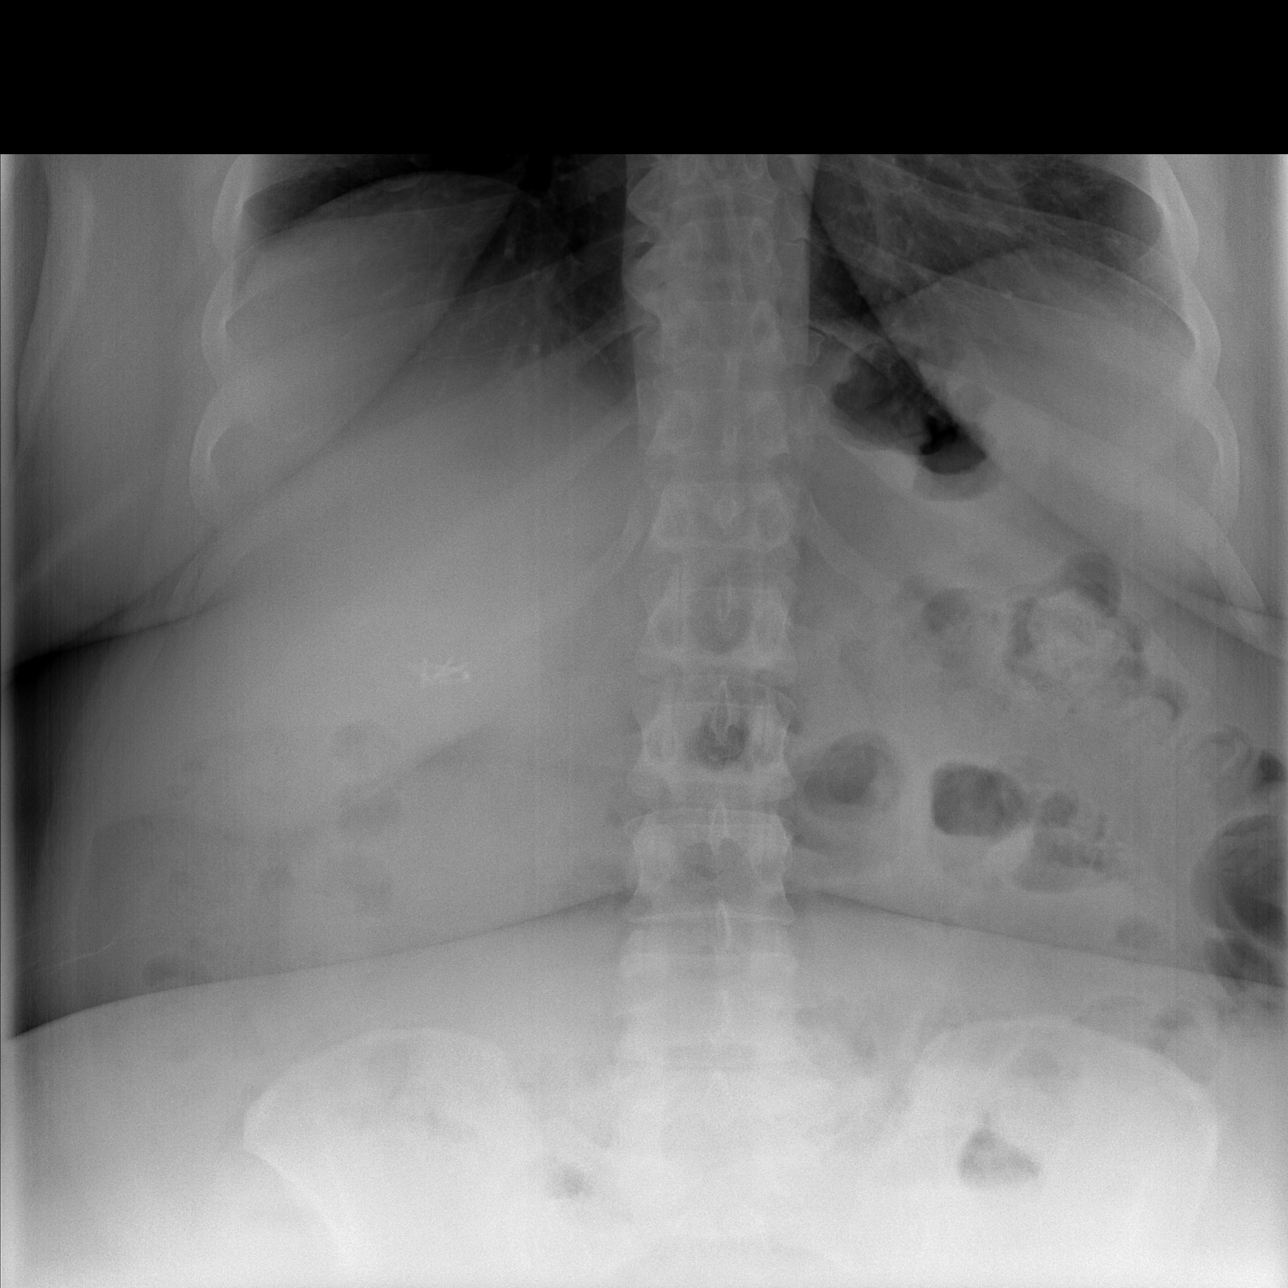

[t abdomen supine * (1 of 2)]
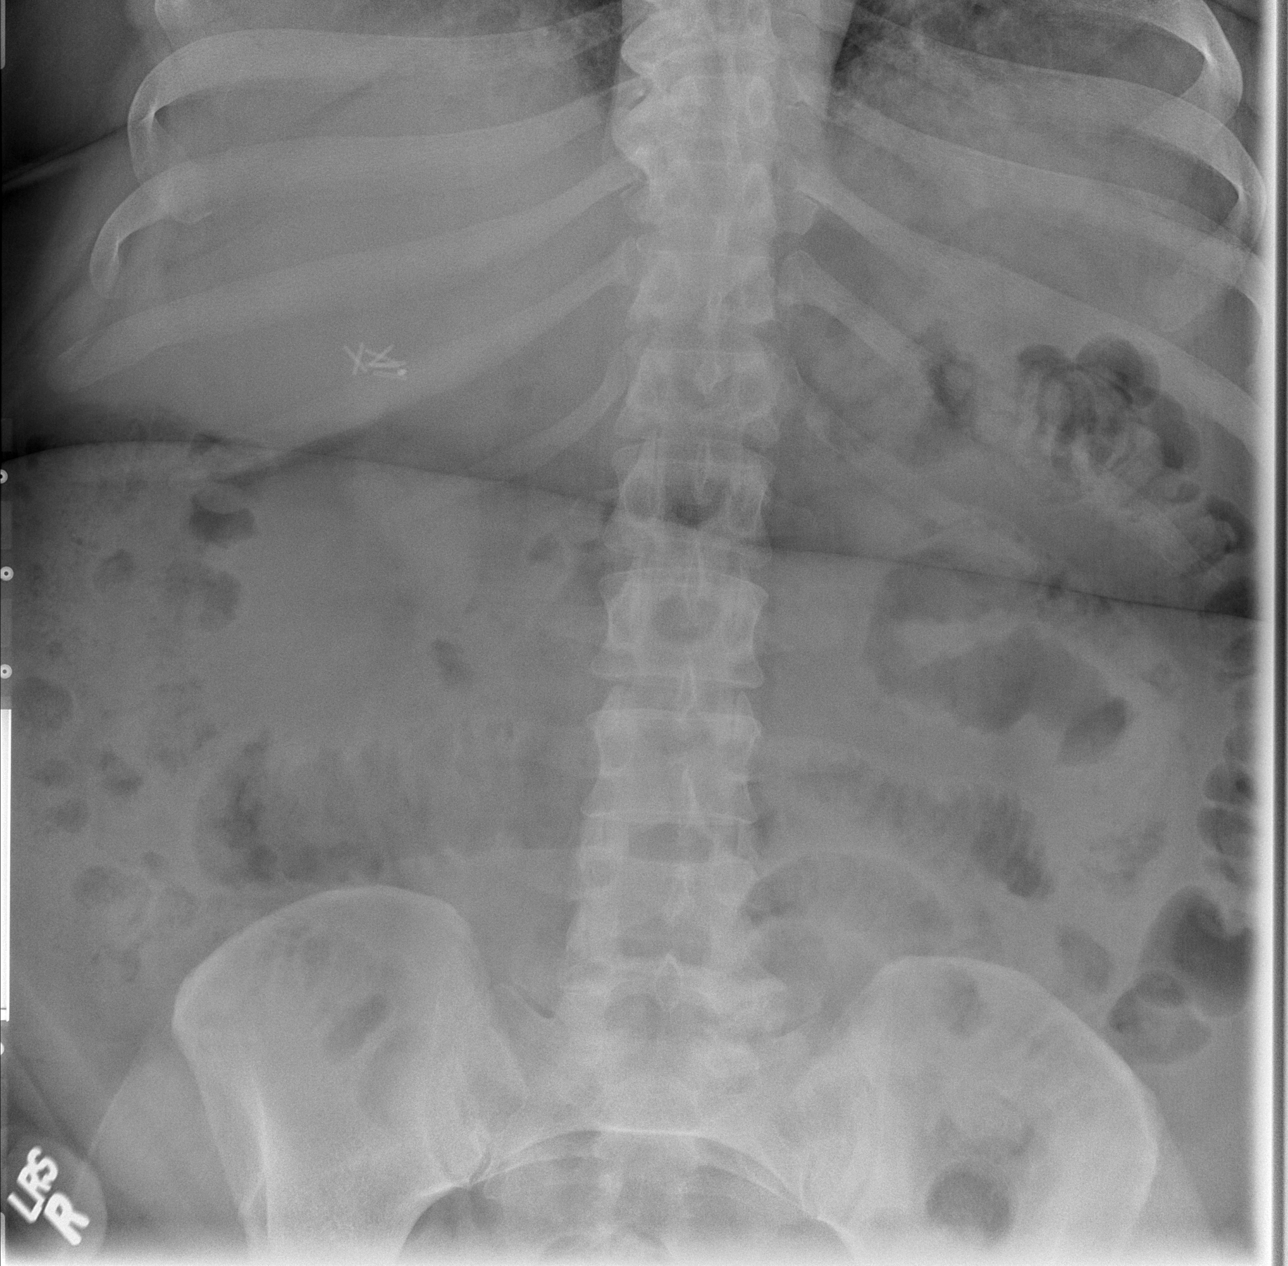

[t abdomen supine * (2 of 2)]
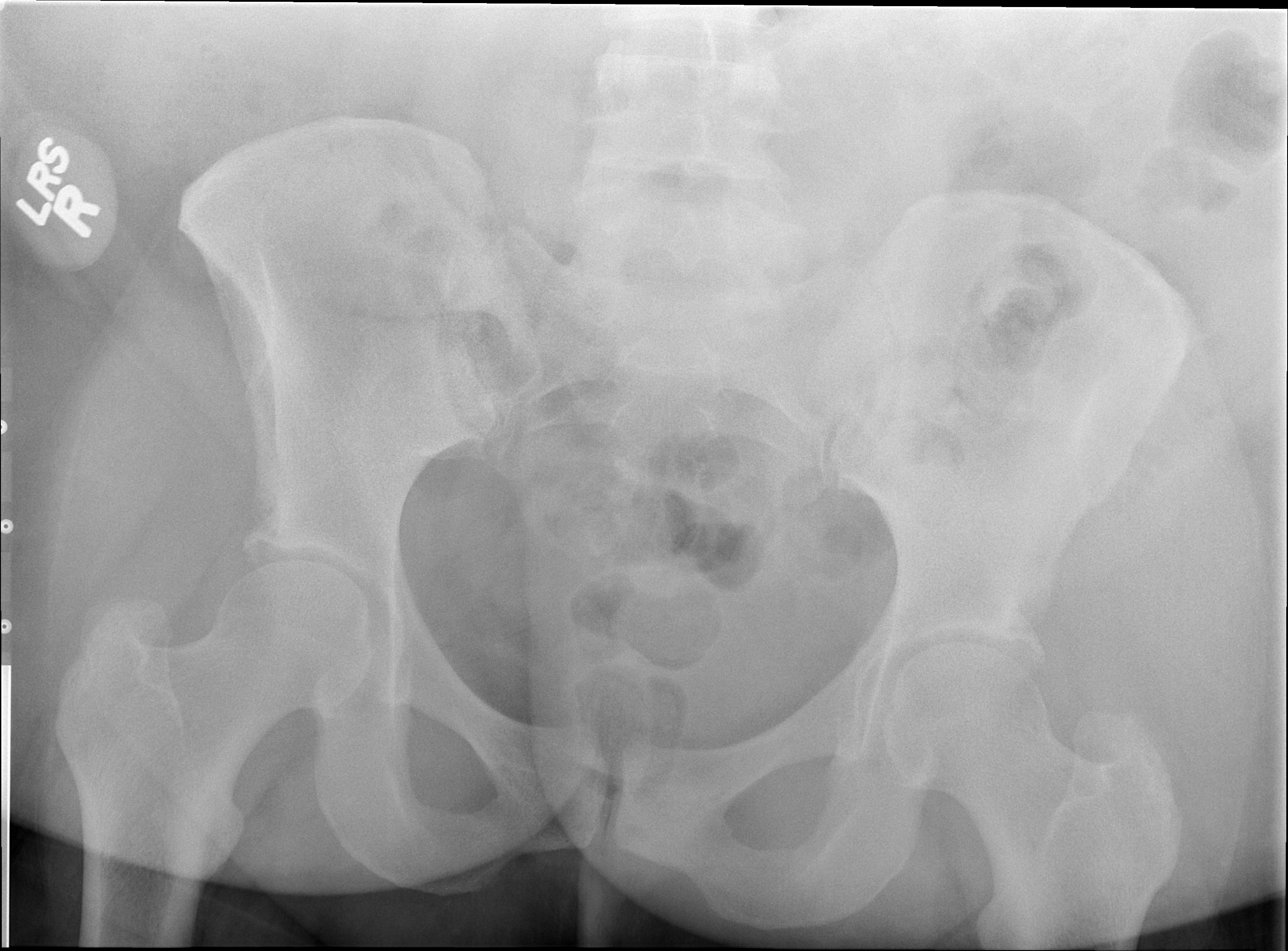

[3 of 3 positions shown; findings below may reference images not displayed]

FINDINGS: The visualized bowel gas pattern is unremarkable.
Scattered air and stool filled loops of colon are seen; no abnormal
dilatation of small bowel loops is seen to suggest small bowel
obstruction.  No free intra-abdominal air is identified, though
evaluation for free air is limited on a single supine view.

The visualized osseous structures are within normal limits; the
sacroiliac joints are unremarkable in appearance.  Clips are noted
within the right upper quadrant, reflecting prior cholecystectomy.
The visualized lung bases are essentially clear.
IMPRESSION: Unremarkable bowel gas pattern; no free intra-abdominal air seen.

## 2013-02-18 ENCOUNTER — Ambulatory Visit (INDEPENDENT_AMBULATORY_CARE_PROVIDER_SITE_OTHER): Payer: BC Managed Care – PPO | Admitting: Family Medicine

## 2013-02-18 ENCOUNTER — Encounter: Payer: Self-pay | Admitting: Family Medicine

## 2013-02-18 VITALS — BP 145/84 | HR 100 | Temp 98.6°F | Ht 66.0 in | Wt 316.9 lb

## 2013-02-18 DIAGNOSIS — I10 Essential (primary) hypertension: Secondary | ICD-10-CM

## 2013-02-18 DIAGNOSIS — E119 Type 2 diabetes mellitus without complications: Secondary | ICD-10-CM

## 2013-02-18 MED ORDER — DESLORATADINE 5 MG PO TABS: 5.0000 mg | ORAL_TABLET | Freq: Every day | ORAL | Status: DC

## 2013-02-18 NOTE — Progress Notes (Signed)
  Subjective:    Patient ID: Erin Schultz, female    DOB: [birth year], 47 y.o.   MRN: [id number]  HPI  Hypertension: Home health RN checked BP today 154/94.  Today BP is 145/84.  Patient taking Maxzide 25 mg daily.  She has lost about 5 lb in the last 2 months.  She seems motivated to continue to eat better and lose more weight.  She wishes to hold off on adding new BP medication today (she used to take Amlodipine).  Denies any HA, CP, SOB, N/V, or abdominal pain.   Diabetes: CBG range 100 to 170.  No hypoglycemic episodes.  Taking Glipizide 10 mg and Metformin 1000 BID.  Denies any HA, nausea/vomiting, paresthesias, or foot ulcerations.  Abdominal wound is healing well per General surgeon.    Review of Systems Per HPI    Objective:   Physical Exam  Constitutional: She appears well-nourished. No distress.  HENT:  Head: Normocephalic and atraumatic.  Mouth/Throat: Oropharynx is clear and moist.  Pulmonary/Chest: Effort normal.  Musculoskeletal: Normal range of motion. She exhibits no edema.  Neurological:  Grossly normal  Skin: Skin is warm and dry.  Wound vac abdomen - mild erythema under tape, but no signs of infection.          Assessment & Plan:

## 2013-02-18 NOTE — Assessment & Plan Note (Signed)
I have confidence patient will continue to lose weight.  Will hold off on starting new BP medication.  Recheck BP next month.

## 2013-02-18 NOTE — Addendum Note (Signed)
Addended by: Jennette Bill on: 02/18/2013 04:57 PM   Modules accepted: Orders

## 2013-02-18 NOTE — Patient Instructions (Addendum)
It was good to see you today, Erin Schultz. Your blood pressure was acceptable today 145/84.  Continue Maxzide 25 mg daily. Continue current medications for diabetes. Schedule follow up appointment with me in one month or sooner as needed.

## 2013-02-18 NOTE — Assessment & Plan Note (Signed)
She has lost 5 lb in 2 months roughly.  Hemoglobin A1c was elevated 8.8 2 months ago.  Will repeat next month.  No hypoglycemic episodes or red flags.  Continue current regimen.  Follow up in one month.

## 2013-02-23 ENCOUNTER — Ambulatory Visit (INDEPENDENT_AMBULATORY_CARE_PROVIDER_SITE_OTHER): Payer: BC Managed Care – PPO | Admitting: General Surgery

## 2013-02-23 VITALS — BP 138/82 | HR 93 | Temp 98.7°F | Resp 18 | Ht 66.0 in | Wt 320.0 lb

## 2013-02-23 DIAGNOSIS — K431 Incisional hernia with gangrene: Secondary | ICD-10-CM

## 2013-02-23 NOTE — Patient Instructions (Signed)
Continue wound vac placement.    Follow up in 3-4 weeks.

## 2013-02-24 NOTE — Progress Notes (Signed)
HISTORY: Patient continues to do well.  Wound odor improving.  Wound continues to granulate and shrink.  They occasionally have issues with the vac leaking (usually on weekends).  No fevers/ chills.  Energy level continues to improve.      EXAM: General:  Alert and oriented Incision:  Wound vac in place.  Vac changed.  Beefy red granulation tissue.  Wound continues to shrink.  No purulent drainage.  Small tunnel at left inferolateral portion of wound now around 1.5 cm     PATHOLOGY: Small intestine, resection, small bowel - BENIGN SMALL BOWEL PROLIFERATION WITH TRANSMURAL ISCHEMIC INJURY, INFLAMMATORY SEROSAL EXUDATE, AND SEROSAL ADHESIONS. - NEGATIVE FOR DYSPLASIA OR MALIGNANCY. - FOUR BENIGN LYMPH NODES (0/4).   ASSESSMENT AND PLAN:   Continue vac Follow up in 4 weeks.      Maudry Diego, MD Surgical Oncology, General & Endocrine Surgery [facility]  DE LA CRUZ,IVY, DO de [place], [place], [place]

## 2013-02-25 ENCOUNTER — Telehealth (INDEPENDENT_AMBULATORY_CARE_PROVIDER_SITE_OTHER): Payer: Self-pay

## 2013-02-25 ENCOUNTER — Encounter: Payer: Self-pay | Admitting: *Deleted

## 2013-02-25 DIAGNOSIS — E119 Type 2 diabetes mellitus without complications: Secondary | ICD-10-CM

## 2013-02-25 NOTE — Telephone Encounter (Signed)
[facility] nurse calling to request extension of orders for pt's wound vac care.  Ok'd 3 x wk until pt's return appt with Dr. Donell Beers on 03/17/13.

## 2013-02-27 ENCOUNTER — Other Ambulatory Visit: Payer: Self-pay | Admitting: Family Medicine

## 2013-03-05 ENCOUNTER — Other Ambulatory Visit: Payer: Self-pay | Admitting: Family Medicine

## 2013-03-17 ENCOUNTER — Ambulatory Visit (INDEPENDENT_AMBULATORY_CARE_PROVIDER_SITE_OTHER): Payer: BC Managed Care – PPO | Admitting: General Surgery

## 2013-03-17 ENCOUNTER — Encounter (INDEPENDENT_AMBULATORY_CARE_PROVIDER_SITE_OTHER): Payer: Self-pay | Admitting: General Surgery

## 2013-03-17 VITALS — BP 130/92 | HR 94 | Temp 96.9°F | Ht 66.0 in | Wt 325.4 lb

## 2013-03-17 DIAGNOSIS — K431 Incisional hernia with gangrene: Secondary | ICD-10-CM

## 2013-03-17 NOTE — Progress Notes (Signed)
HISTORY: Patient continues to do well. Her wound is shrinking more rapidly and becoming more superficial.     EXAM: General:  Alert and oriented Incision:  Wound vac in place.  Vac changed.  Tunnel shrinking as well.  White foam placed in tunnel.    ASSESSMENT AND PLAN:   Continue vac Follow up in 4 weeks.   May be able to switch to bacitracin only prior to that.  Will see how she is doing.     Maudry Diego, MD Surgical Oncology, General & Endocrine Surgery [facility]  DE LA CRUZ,IVY, DO de [place], [place], [place]

## 2013-03-17 NOTE — Patient Instructions (Signed)
Continue vac  Follow up in 4 weeks.

## 2013-03-18 ENCOUNTER — Telehealth (INDEPENDENT_AMBULATORY_CARE_PROVIDER_SITE_OTHER): Payer: Self-pay | Admitting: *Deleted

## 2013-03-18 NOTE — Telephone Encounter (Signed)
French Ana with [facility] called to ask for a continuation order for care of the wound vac.  Order was given to continue until patient see's Byerly MD again on 04/13/13.

## 2013-04-07 ENCOUNTER — Other Ambulatory Visit: Payer: Self-pay | Admitting: Family Medicine

## 2013-04-07 ENCOUNTER — Telehealth (INDEPENDENT_AMBULATORY_CARE_PROVIDER_SITE_OTHER): Payer: Self-pay

## 2013-04-07 NOTE — Telephone Encounter (Signed)
That is fine. tx FB 

## 2013-04-07 NOTE — Telephone Encounter (Signed)
Verbal order given per Dr. Donell Beers to draw labs.

## 2013-04-07 NOTE — Telephone Encounter (Signed)
Requested Prescriptions   Pending Prescriptions Disp Refills  . metFORMIN (GLUCOPHAGE) 1000 MG tablet [Pharmacy Med Name: METFORMIN HCL 1000 MG TABLET] 60 tablet 1    Sig: TAKE 1 TABLET BY MOUTH TWICE A DAY WITH MEALS

## 2013-04-07 NOTE — Telephone Encounter (Signed)
Home health care nurse calling to request verbal order to draw pre-albumin on Erin Schultz.  They are concerned that wound is not closing (still tunneling), and the Case Manager at [facility] suggested the lab work.  Dr. Donell Beers notified.

## 2013-04-13 ENCOUNTER — Ambulatory Visit (INDEPENDENT_AMBULATORY_CARE_PROVIDER_SITE_OTHER): Payer: BC Managed Care – PPO | Admitting: General Surgery

## 2013-04-13 ENCOUNTER — Encounter (INDEPENDENT_AMBULATORY_CARE_PROVIDER_SITE_OTHER): Payer: Self-pay | Admitting: General Surgery

## 2013-04-13 VITALS — BP 120/68 | HR 92 | Resp 16 | Ht 66.0 in | Wt 318.0 lb

## 2013-04-13 DIAGNOSIS — K421 Umbilical hernia with gangrene: Secondary | ICD-10-CM

## 2013-04-13 MED ORDER — CEPHALEXIN 500 MG PO CAPS: 500.0000 mg | ORAL_CAPSULE | Freq: Three times a day (TID) | ORAL | Status: AC

## 2013-04-13 NOTE — Patient Instructions (Addendum)
Continue vac.  Follow up in 6 weeks.

## 2013-04-13 NOTE — Progress Notes (Signed)
HISTORY: Pt is s/p VHR/SBR for strangulated ventral hernias.  She had open wound.  She has been using the wound vac and making significant progress.  She denies n/v.  She denies pain.  She continues to increase her energy level.      EXAM: General:  Alert and oriented.   Incision:  Superficial aspect almost completely healed over.  Tunnel around 1.5 inches long.      ASSESSMENT AND PLAN:   Hernia, umbilical, with gangrene Skin portion of wound almost healed.  Tunnel at 5 o'clock still resistant to healing.  Will recheck in 5-6 weeks.  At that point, will switch over to [facility].          Maudry Diego, MD Surgical Oncology, General & Endocrine Surgery [facility]  DE LA CRUZ,IVY, DO de [place], [place], [place]

## 2013-04-13 NOTE — Assessment & Plan Note (Signed)
Skin portion of wound almost healed.  Tunnel at 5 o'clock still resistant to healing.  Will recheck in 5-6 weeks.  At that point, will switch over to [facility].

## 2013-04-14 ENCOUNTER — Telehealth (INDEPENDENT_AMBULATORY_CARE_PROVIDER_SITE_OTHER): Payer: Self-pay

## 2013-04-14 NOTE — Telephone Encounter (Signed)
Tracie w/   [facility] would like order for Anti coat flex  With silver cr  To promote healing  And continue wound vac order  For this patient  # [phone]

## 2013-04-14 NOTE — Telephone Encounter (Signed)
Spoke with [facility] nurse who is requesting a verbal order to try Acticoat Flex w/ Silver to apply to the pt's abdominal wound under the wound vac sponge.  Their wound care nurse believes this may help the surface of the wound heal more quickly.  Dr. Donell Beers will be made aware and we will let them know her decision.

## 2013-04-15 ENCOUNTER — Telehealth (INDEPENDENT_AMBULATORY_CARE_PROVIDER_SITE_OTHER): Payer: Self-pay

## 2013-04-15 NOTE — Telephone Encounter (Signed)
Confirmed ok per Dr. Donell Beers to use Acticoat Flex w/ Silver on pt's wound.  Continue wound vac.  Also, pt is concerned about interaction of Keflex w/ her Metformin.  Dr. Donell Beers says it should not be a problem.  HH nurse will tell the pt ok to take her Metformin today.

## 2013-04-15 NOTE — Telephone Encounter (Signed)
No notes in encounter. 

## 2013-04-16 ENCOUNTER — Telehealth (INDEPENDENT_AMBULATORY_CARE_PROVIDER_SITE_OTHER): Payer: Self-pay

## 2013-04-16 NOTE — Telephone Encounter (Signed)
Home health nurse reports that insurance will not cover the Acticoat Flex w/ Silver, and it will cost her $400 out of pocket.  The wound care nurse is now suggesting Dakins solution.  Dr. Donell Beers will be make aware and we will call her back.

## 2013-04-19 NOTE — Telephone Encounter (Signed)
OK to try dakin's

## 2013-04-20 ENCOUNTER — Encounter: Payer: Self-pay | Admitting: Family Medicine

## 2013-04-20 ENCOUNTER — Ambulatory Visit (INDEPENDENT_AMBULATORY_CARE_PROVIDER_SITE_OTHER): Payer: BC Managed Care – PPO | Admitting: Family Medicine

## 2013-04-20 VITALS — BP 152/85 | HR 94 | Temp 99.1°F | Ht 66.0 in | Wt 319.0 lb

## 2013-04-20 DIAGNOSIS — I10 Essential (primary) hypertension: Secondary | ICD-10-CM

## 2013-04-20 DIAGNOSIS — E119 Type 2 diabetes mellitus without complications: Secondary | ICD-10-CM

## 2013-04-20 NOTE — Assessment & Plan Note (Signed)
BP 152/85 today which is atypical for her. - Continue Lotrel and Maxzide (she just restarted Lotrel in May) - No red flags or concerns on exam - Continue DASH diet and exercise - Follow up in 3 months or sooner as needed

## 2013-04-20 NOTE — Telephone Encounter (Signed)
LMOV Dr. Donell Beers approved the Dakins.

## 2013-04-20 NOTE — Progress Notes (Signed)
  Subjective:     Erin Schultz is a 47 y.o. female who presents for follow up of diabetes.  Current symptoms include: none. Patient denies foot ulcerations, hyperglycemia, hypoglycemia , paresthesia of the feet, visual disturbances and weight loss. Evaluation to date has been: hemoglobin A1C and checks sugars three times per day.  She has been walking twice per day, about 3 days per week. Home sugars: 83 and 140s. Current treatments: Metformin 1000 mg BID and Glipizide 10 mg BID.  Last eye exam: May 2014.  Hypertension: BP is slightly elevated today, but this is atypical for patient.  BP earlier this month was normal.  She is eating a low sodium and walking 3 days/week.  She denies any CP, SOB, abdominal pain, or HA.  She denies any blurry vision.  She takes Lotrel daily and Maxzide daily.  Review of Systems Pertinent items are noted in HPI.    Objective:    BP 152/85  Pulse 94  Temp(Src) 99.1 F (37.3 C) (Oral)  Ht 5\' 6"  (1.676 m)  Wt 319 lb (144.697 kg)  BMI 51.51 kg/m2 General appearance: alert, cooperative and no distress Neck: supple, symmetrical, trachea midline Lungs: clear to auscultation bilaterally Heart: regular rate and rhythm, S1, S2 normal, no murmur, click, rub or gallop Abdomen: deferred, wound vac in place Extremities: extremities normal, atraumatic, no cyanosis or edema Skin: normal monofilament exam, no foot ulcerations bilaterally     Assessment:    Diabetes mellitus Type II, under excellent control.    Hypertension.  Plan:    See problem list

## 2013-04-20 NOTE — Telephone Encounter (Signed)
The nurse called to verify if Dakin's solution was approved.  I read the chart notes and let her know it was.  She asked if they are to dc the wound vac and I said no.

## 2013-04-20 NOTE — Assessment & Plan Note (Signed)
Hemoglobin A1c was 6.1 today (last February it was 8.8).  Patient very pleased with results.   - Will discontinue Glipizide - Continue Metformin 1000 mg BID for now and recommended checking CBG daily  - She is to call me if CBG <60 or >250 - She is motivated to continue exercising and eating better - Follow up with new doctor in 3 months or sooner as needed

## 2013-04-20 NOTE — Patient Instructions (Addendum)
1. Your hemoglobin A1c is now 6.1.  Congratulations!. 2. STOP taking Glipizide.  You do not need this anymore. 3. Continue Metformin twice per day.  Continue your current diet and exercise plan. 4. You can check your blood sugars ONCE per day from now on.  If BG < 60 or > 250, please call me. 5. Your blood pressure was a little high today, but continue with your weight loss plan. 6. Schedule your next diabetes appointment in 3 months.  It was a pleasure getting to know you, Erin Schultz.  Thank you.

## 2013-04-24 ENCOUNTER — Encounter (INDEPENDENT_AMBULATORY_CARE_PROVIDER_SITE_OTHER): Payer: Self-pay

## 2013-04-28 ENCOUNTER — Telehealth (INDEPENDENT_AMBULATORY_CARE_PROVIDER_SITE_OTHER): Payer: Self-pay

## 2013-04-28 NOTE — Telephone Encounter (Signed)
[facility] calling for verbal continuation of orders for visits for wound vac.  She will recertify her for 60 days, and write visit orders until her appt with Dr. Donell Beers on 05/26/13.  At that time she will call us for verbal continuation of orders.

## 2013-04-30 ENCOUNTER — Telehealth (INDEPENDENT_AMBULATORY_CARE_PROVIDER_SITE_OTHER): Payer: Self-pay

## 2013-04-30 NOTE — Telephone Encounter (Signed)
Calling in to inform our office that patient exhausting her benefits for wound vac/home health.  Advised that Dr. Donell Beers is not in the office until next week and they will need to speak with the physician directly to discuss in further detail.  [employer] will contact our office Monday 05/04/13 to have Dr. Donell Beers paged to discuss treatment plan.

## 2013-05-06 ENCOUNTER — Telehealth (INDEPENDENT_AMBULATORY_CARE_PROVIDER_SITE_OTHER): Payer: Self-pay

## 2013-05-06 NOTE — Telephone Encounter (Signed)
[facility] nurse calling to request verbal orders to switch wound vac to Hydrofera Blue foam gauze. They have been unable to get [employer] to approve any more home visits for the vac. This gauze can be used to pack the small tunnel and also cover the wound itself.  The foam gauze is then covered with an ABD pad.  Visits required are 2 x wk.  Would like to know if Dr. Donell Beers will approve.

## 2013-05-06 NOTE — Telephone Encounter (Signed)
D/C wound vac and begin dressing changes with Hydrofera Blue foam gauze 2 x  week ok'd by Dr. Donell Beers.  [facility] made aware.

## 2013-05-06 NOTE — Telephone Encounter (Signed)
Fine with me to do the change that [facility] wants.

## 2013-05-07 ENCOUNTER — Other Ambulatory Visit: Payer: Self-pay | Admitting: Family Medicine

## 2013-05-08 ENCOUNTER — Other Ambulatory Visit: Payer: Self-pay | Admitting: *Deleted

## 2013-05-08 MED ORDER — TRIAMTERENE-HCTZ 37.5-25 MG PO TABS: ORAL_TABLET | ORAL | Status: DC

## 2013-05-19 ENCOUNTER — Telehealth (INDEPENDENT_AMBULATORY_CARE_PROVIDER_SITE_OTHER): Payer: Self-pay

## 2013-05-19 NOTE — Telephone Encounter (Signed)
Unable to contact French Ana at [facility] - no telephone number.  On hold w/ [facility] for 8 minutes.  Dr. Arita Miss instructions are to stop packing the wound tunnel.  Continue dressing of larger wound.

## 2013-05-19 NOTE — Telephone Encounter (Signed)
Erin Schultz with [facility] called with wd update. Wd tunneling is getting deeper since wd vac d/c. Went from 1.9 cm last week to 2.6 cm this week. Wound bed is improving. I advised Erin Schultz I will send this update to Dr Donell Beers and her assistant.

## 2013-05-21 ENCOUNTER — Other Ambulatory Visit: Payer: Self-pay

## 2013-05-26 ENCOUNTER — Telehealth (INDEPENDENT_AMBULATORY_CARE_PROVIDER_SITE_OTHER): Payer: Self-pay

## 2013-05-26 ENCOUNTER — Ambulatory Visit (INDEPENDENT_AMBULATORY_CARE_PROVIDER_SITE_OTHER): Payer: BC Managed Care – PPO | Admitting: General Surgery

## 2013-05-26 ENCOUNTER — Encounter (INDEPENDENT_AMBULATORY_CARE_PROVIDER_SITE_OTHER): Payer: Self-pay | Admitting: General Surgery

## 2013-05-26 ENCOUNTER — Encounter (INDEPENDENT_AMBULATORY_CARE_PROVIDER_SITE_OTHER): Payer: Self-pay

## 2013-05-26 VITALS — BP 132/82 | HR 88 | Resp 16 | Ht 66.0 in | Wt 318.2 lb

## 2013-05-26 DIAGNOSIS — K421 Umbilical hernia with gangrene: Secondary | ICD-10-CM

## 2013-05-26 NOTE — Patient Instructions (Signed)
Continue the gel as you are doing for another 1-2 weeks.  Then switch to antibiotic ointment with non stick dressing after that.  Follow up in 4-6 weeks.  OK to drive.  OK to return to work.

## 2013-05-26 NOTE — Progress Notes (Signed)
HISTORY: Pt is s/p VHR/SBR for strangulated ventral hernias.  She had open wound.  We d/c'd the wound vac several weeks ago.  She has started using a different dressing called hydrofera blue.  The tunnel that she had is gone.  She is now doing changes herself.       EXAM: General:  Alert and oriented.   Incision:  Skin still with some area that has not epithelialized.  No tunnel.      ASSESSMENT AND PLAN:   Hernia, umbilical, with gangrene Wound nearly completely healed.  Follow up in 4-6 weeks.         Maudry Diego, MD Surgical Oncology, General & Endocrine Surgery [facility]  Kevin Fenton, MD No ref. provider found

## 2013-05-26 NOTE — Assessment & Plan Note (Signed)
Wound nearly completely healed.  Follow up in 4-6 weeks.

## 2013-05-26 NOTE — Telephone Encounter (Signed)
Nurse with [facility] called to check on the pt's visit today and see if she can go back out one more time.  Dr Donell Beers gave the authorization.  I notified French Ana.

## 2013-06-04 ENCOUNTER — Other Ambulatory Visit: Payer: Self-pay | Admitting: Family Medicine

## 2013-07-07 ENCOUNTER — Ambulatory Visit (INDEPENDENT_AMBULATORY_CARE_PROVIDER_SITE_OTHER): Payer: BC Managed Care – PPO | Admitting: General Surgery

## 2013-07-07 ENCOUNTER — Encounter (INDEPENDENT_AMBULATORY_CARE_PROVIDER_SITE_OTHER): Payer: Self-pay | Admitting: General Surgery

## 2013-07-07 VITALS — BP 148/90 | HR 84 | Resp 16 | Ht 66.0 in | Wt 325.2 lb

## 2013-07-07 DIAGNOSIS — K421 Umbilical hernia with gangrene: Secondary | ICD-10-CM

## 2013-07-07 NOTE — Assessment & Plan Note (Signed)
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 She also continues to have incomplete healing of her wound.  Advised her to continue antibiotic ointment and a nonstick dressing. I will see her back in approximately 2 months. At that point, in order a CT scan to evaluate the recurrent ventral hernia.  She will likely need repeat repair. Discussed weight loss with her. She is nondiabetic and nonsmoker.  She will not be able to get her hernia repaired until the other wound is completely healed. At that point, I will refer her to a different surgeon within the practice for hernia repair.

## 2013-07-07 NOTE — Progress Notes (Signed)
HISTORY: Pt is s/p repair of incarcerated hernia in February.  She has had wound infection that has been very slow to heal.  She had multiple surgeries due to staged closure of the abdominal wall.  Dr. Ezzard Standing closed her with flex HD mesh.  She is now experiencing more swelling at the upper portion of the incision.  She has continued to do antibiotic ointment to abdominal wall.     PERTINENT REVIEW OF SYSTEMS: Otherwise negative.    Filed Vitals:   07/07/13 1548  BP: 148/90  Pulse: 84  Resp: 16   Filed Weights   07/07/13 1548  Weight: 325 lb 3.2 oz (147.51 kg)     EXAM: Head: Normocephalic and atraumatic.  Eyes:  Conjunctivae are normal. Pupils are equal, round, and reactive to light. No scleral icterus.  Resp: No respiratory distress, normal effort. Abd:  Abdomen is soft, non distended and non tender. Recurrent hernia at upper portion of incision.  No masses are palpable.  There is no rebound and no guarding. there is still a bit of area that has not epithelialized.   Neurological:  Alert and oriented to person, place, and time. Coordination normal.  Skin: Skin is warm and dry. No rash noted. No diaphoretic. No erythema. No pallor.  Psychiatric: Normal mood and affect. Normal behavior. Judgment and thought content normal.      ASSESSMENT AND PLAN:   Hernia, umbilical, with gangrene Patient appears to have a recurrent ventral hernia. She also continues to have incomplete healing of her wound.  Advised her to continue antibiotic ointment and a nonstick dressing. I will see her back in approximately 2 months. At that point, in order a CT scan to evaluate the recurrent ventral hernia.  She will likely need repeat repair. Discussed weight loss with her. She is nondiabetic and nonsmoker.  She will not be able to get her hernia repaired until the other wound is completely healed. At that point, I will refer her to a different surgeon within the practice for hernia  repair.      Maudry Diego, MD Surgical Oncology, General & Endocrine Surgery [facility]  Kevin Fenton, MD Elenora Gamma, MD

## 2013-07-07 NOTE — Patient Instructions (Signed)
Continue antibiotic ointment and non stick dressing.  You can use triple antibiotic ointment, polysporin, neosporin, A&D, or other type.  OK to just use the ointment and leave dressing off if you desire.  The gauze is mostly to keep your clothes clean.    Follow up in 2 months.

## 2013-09-01 ENCOUNTER — Encounter (INDEPENDENT_AMBULATORY_CARE_PROVIDER_SITE_OTHER): Payer: Self-pay

## 2013-09-01 ENCOUNTER — Telehealth (INDEPENDENT_AMBULATORY_CARE_PROVIDER_SITE_OTHER): Payer: Self-pay | Admitting: *Deleted

## 2013-09-01 ENCOUNTER — Encounter (INDEPENDENT_AMBULATORY_CARE_PROVIDER_SITE_OTHER): Payer: Self-pay | Admitting: General Surgery

## 2013-09-01 ENCOUNTER — Ambulatory Visit (INDEPENDENT_AMBULATORY_CARE_PROVIDER_SITE_OTHER): Payer: BC Managed Care – PPO | Admitting: General Surgery

## 2013-09-01 ENCOUNTER — Other Ambulatory Visit (INDEPENDENT_AMBULATORY_CARE_PROVIDER_SITE_OTHER): Payer: Self-pay | Admitting: General Surgery

## 2013-09-01 VITALS — BP 148/88 | HR 70 | Temp 96.9°F | Resp 18 | Ht 66.0 in | Wt 332.8 lb

## 2013-09-01 DIAGNOSIS — K432 Incisional hernia without obstruction or gangrene: Secondary | ICD-10-CM

## 2013-09-01 NOTE — Patient Instructions (Signed)
Continue to wear binder or girdle for hernia support.  This can be repaired.    Call when symptomatic or when you think it is enlarging.  Get CT scan to better define anatomy.

## 2013-09-01 NOTE — Telephone Encounter (Signed)
I spoke with pt to inform her of appt for her CT at [id number] on 09/03/13 with an arrival time of 9:00am.  Pt instructed to drink 1st bottle of contrast at 7:40am and 2nd bottle at 8:40am.  Also instructed pt to have NO solid foods 4 hours prior to scan.

## 2013-09-02 DIAGNOSIS — K432 Incisional hernia without obstruction or gangrene: Secondary | ICD-10-CM | POA: Insufficient documentation

## 2013-09-02 NOTE — Progress Notes (Signed)
HISTORY: Pt is s/p repair of incarcerated hernia in February.  She has had wound infection that has been very slow to heal.  She had multiple surgeries due to staged closure of the abdominal wall.  Dr. Ezzard Standing closed her with flex HD mesh. She has had closure of the abdominal wall.   The bulge at the top of her incision has gotten larger.  She denies pain. She is doing very well.     PERTINENT REVIEW OF SYSTEMS: Otherwise negative.    Filed Vitals:   09/01/13 1514  BP: 148/88  Pulse: 70  Temp: 96.9 F (36.1 C)  Resp: 18   Filed Weights   09/01/13 1514  Weight: 332 lb 12.8 oz (150.957 kg)     EXAM: Head: Normocephalic and atraumatic.  Eyes:  Conjunctivae are normal. Pupils are equal, round, and reactive to light. No scleral icterus.  Resp: No respiratory distress, normal effort. Abd:  Abdomen is soft, non distended and non tender. Recurrent hernia at incision.  No masses are palpable.  There is no rebound and no guarding. There is full epithelialization of wound. Neurological:  Alert and oriented to person, place, and time. Coordination normal.  Skin: Skin is warm and dry. No rash noted. No diaphoretic. No erythema. No pallor.  Psychiatric: Normal mood and affect. Normal behavior. Judgment and thought content normal.      ASSESSMENT AND PLAN:   Recurrent ventral incisional hernia Will get CT scan to evaluate anatomy of hernia.    Hernia much larger now than previously.  Should make it less likely to incarcerate.    Advised pt that she will likely need repair.  Since she just recently has had wound healing, she would like to wait for a while.  She is wearing a girdle.    She was counseled that weight loss will help with decreased risk of hernia recurrence after repeat repair.         Maudry Diego, MD Surgical Oncology, General & Endocrine Surgery [facility]  Kevin Fenton, MD Elenora Gamma, MD

## 2013-09-02 NOTE — Assessment & Plan Note (Signed)
Will get CT scan to evaluate anatomy of hernia.    Hernia much larger now than previously.  Should make it less likely to incarcerate.    Advised pt that she will likely need repair.  Since she just recently has had wound healing, she would like to wait for a while.  She is wearing a girdle.    She was counseled that weight loss will help with decreased risk of hernia recurrence after repeat repair.

## 2013-09-03 ENCOUNTER — Ambulatory Visit
Admission: RE | Admit: 2013-09-03 | Discharge: 2013-09-03 | Disposition: A | Discharge status: Home / Self Care | Payer: BC Managed Care – PPO | Source: Ambulatory Visit | Attending: General Surgery | Admitting: General Surgery

## 2013-09-03 ENCOUNTER — Other Ambulatory Visit (INDEPENDENT_AMBULATORY_CARE_PROVIDER_SITE_OTHER): Payer: Self-pay | Admitting: General Surgery

## 2013-09-03 DIAGNOSIS — K432 Incisional hernia without obstruction or gangrene: Secondary | ICD-10-CM

## 2013-09-03 MED ORDER — IOHEXOL 300 MG/ML  SOLN
125.0000 mL | Freq: Once | INTRAMUSCULAR | Status: AC | PRN
  Administered 2013-09-03: 125 mL via INTRAVENOUS

## 2013-09-20 ENCOUNTER — Other Ambulatory Visit: Payer: Self-pay | Admitting: Family Medicine

## 2013-09-21 NOTE — Telephone Encounter (Signed)
Refilled HTN meds, has DM2 and HTN, without f/iu in 4 months. Will ask team to call.   Murtis Sink, MD [facility] Resident, PGY-2 09/21/2013, 12:29 PM

## 2013-10-03 ENCOUNTER — Other Ambulatory Visit: Payer: Self-pay | Admitting: Family Medicine

## 2013-10-07 ENCOUNTER — Ambulatory Visit: Payer: BC Managed Care – PPO | Admitting: Family Medicine

## 2013-10-15 ENCOUNTER — Ambulatory Visit (INDEPENDENT_AMBULATORY_CARE_PROVIDER_SITE_OTHER): Payer: BC Managed Care – PPO | Admitting: Family Medicine

## 2013-10-15 ENCOUNTER — Encounter: Payer: Self-pay | Admitting: Family Medicine

## 2013-10-15 VITALS — BP 123/79 | HR 92 | Temp 98.2°F | Ht 66.0 in | Wt 331.0 lb

## 2013-10-15 DIAGNOSIS — K421 Umbilical hernia with gangrene: Secondary | ICD-10-CM

## 2013-10-15 DIAGNOSIS — E119 Type 2 diabetes mellitus without complications: Secondary | ICD-10-CM

## 2013-10-15 DIAGNOSIS — I10 Essential (primary) hypertension: Secondary | ICD-10-CM

## 2013-10-15 LAB — POCT GLYCOSYLATED HEMOGLOBIN (HGB A1C): Hemoglobin A1C: 7.4

## 2013-10-15 NOTE — Progress Notes (Signed)
Patient ID: Erin Schultz, female   DOB: 01-29-1966, 47 y.o.   MRN: [id number]  Kevin Fenton, MD Phone: [phone]  Subjective:  Chief complaint-noted  f/u DM2 and HTN  DM2 She reports good compliance with her metformin, her glipizide was stopped on her last visit here. She states that she had surgery for a hernia back in February. She said that during the period her wound healing she is very concerned with her diabetes control her blood sugar closely. She now has not checked her blood sugar in about one month. Last month she is checking it she said her average blood sugar was 80 to 120s, but never greater than 200, these are mixture postprandial and fasting. She states her weakness as potato chips, otherwise she does not indulge in carbs very much.   HTN She reports good medical medicine compliance She denies headache, chest pain, shortness of breath, edema. She walks 3-4 times per week about 30 minutes a moderate pace. She also does exercise videos for 20-30 minutes several times a week. She does not check her blood pressure regularly however she does check it once or twice a month at [facility] the last one she remembers was about 150/90.  - ROS- per history of present illness  Past Medical History Patient Active Problem List   Diagnosis Date Noted  . Recurrent ventral incisional hernia 09/02/2013  . Open abdominal wall wound with VAC 01/27/2013  . Small bowel obstruction 12/20/2012  . Acute respiratory failure with hypoxia 12/20/2012  . Septic shock(785.52) 12/20/2012  . Hernia, umbilical, with gangrene 12/20/2012  . Acute peritonitis 12/20/2012  . Severe sepsis(995.92) 12/20/2012  . Protein-calorie malnutrition, moderate 12/20/2012  . DIABETES MELLITUS, TYPE II 04/06/2010  . HYPERLIPIDEMIA 10/29/2008  . DERMATITIS, ATOPIC 10/03/2007  . OBESITY, NOS 01/09/2007  . HYPERTENSION, BENIGN SYSTEMIC 01/09/2007  . RHINITIS, ALLERGIC 01/09/2007    Medications- reviewed and  updated Current Outpatient Prescriptions  Medication Sig Dispense Refill  . amLODipine-benazepril (LOTREL) 5-40 MG per capsule       . desloratadine (CLARINEX) 5 MG tablet TAKE 1 TABLET EVERY DAY  30 tablet  1  . metFORMIN (GLUCOPHAGE) 1000 MG tablet TAKE 1 TABLET BY MOUTH TWICE A DAY WITH MEALS  60 tablet  11  . Multiple Vitamins-Minerals (MULTI-VITAMIN GUMMIES) CHEW Chew by mouth.      . triamterene-hydrochlorothiazide (MAXZIDE-25) 37.5-25 MG per tablet TAKE 1 TABLET BY MOUTH EVERY DAY  102 tablet  0   No current facility-administered medications for this visit.    Objective: BP 123/79  Pulse 92  Temp(Src) 98.2 F (36.8 C) (Oral)  Ht 5\' 6"  (1.676 m)  Wt 331 lb (150.141 kg)  BMI 53.45 kg/m2 Gen: NAD, alert, cooperative with exam HEENT: NCAT, EOMI, PERRL CV: RRR, good S1/S2, no murmur Resp: CTABL, no wheezes, non-labored Abd: S/NT, BS present, no guarding or organomegaly, obese- did not examen hernia site Ext: No edema, warm Neuro: Alert and oriented, No gross deficits   Assessment/Plan:  DIABETES MELLITUS, TYPE II A1C increased to 7.4 Discussed slight worsening of control, offered glipizide or a GLP-1 vs aggressive diet modifications She would like to aggressively manage her diet and exercise Discussed benefits of atkin's diet Will plan to re-check in 3 months and add additional drug if no improvement then. Repeat basic labs at next visit.    HYPERTENSION, BENIGN SYSTEMIC Controlled today, no red flags, continue current meds, Maxide, CCB, and ACEi Encouraged diet, exercise, weight loss F/u 3 months  Hernia, umbilical, with gangrene  Following with Dr. Donell Beers No pain or discomfort per patient, reviewed CT findings and red flags of incarceration.  Encouraged weight loss, which will help with DM2 and HTN also.     Orders Placed This Encounter  Procedures  . POCT A1C    No orders of the defined types were placed in this encounter.

## 2013-10-15 NOTE — Assessment & Plan Note (Addendum)
Controlled today, no red flags, continue current meds, Maxide, CCB, and ACEi Encouraged diet, exercise, weight loss F/u 3 months

## 2013-10-15 NOTE — Assessment & Plan Note (Signed)
Following with Dr. Donell Beers No pain or discomfort per patient, reviewed CT findings and red flags of incarceration.  Encouraged weight loss, which will help with DM2 and HTN also.

## 2013-10-15 NOTE — Patient Instructions (Addendum)
It was great to meet you today!  The book: A new atkins for a new you,   Come back in 3 months  Diet Recommendations for Diabetes   Starchy (carb) foods include: Bread, rice, pasta, potatoes, corn, crackers, bagels, muffins, all baked goods.   Protein foods include: Meat, fish, poultry, eggs, dairy foods, and beans such as pinto and kidney beans (beans also provide carbohydrate).   1. Eat at least 3 meals and 1-2 snacks per day. Never go more than 4-5 hours while awake without eating.  2. Limit starchy foods to TWO per meal and ONE per snack. ONE portion of a starchy  food is equal to the following:   - ONE slice of bread (or its equivalent, such as half of a hamburger bun).   - 1/2 cup of a "scoopable" starchy food such as potatoes or rice.   - 1 OUNCE (28 grams) of starchy snack foods such as crackers or pretzels (look on label).   - 15 grams of carbohydrate as shown on food label.  3. Both lunch and dinner should include a protein food, a carb food, and vegetables.   - Obtain twice as many veg's as protein or carbohydrate foods for both lunch and dinner.   - Try to keep frozen veg's on hand for a quick vegetable serving.     - Fresh or frozen veg's are best.  4. Breakfast should always include protein.

## 2013-10-15 NOTE — Assessment & Plan Note (Signed)
A1C increased to 7.4 Discussed slight worsening of control, offered glipizide or a GLP-1 vs aggressive diet modifications She would like to aggressively manage her diet and exercise Discussed benefits of atkin's diet Will plan to re-check in 3 months and add additional drug if no improvement then. Repeat basic labs at next visit.

## 2013-11-11 ENCOUNTER — Other Ambulatory Visit: Payer: Self-pay | Admitting: Family Medicine

## 2013-11-18 ENCOUNTER — Other Ambulatory Visit: Payer: Self-pay

## 2013-11-18 DIAGNOSIS — Z1231 Encounter for screening mammogram for malignant neoplasm of breast: Secondary | ICD-10-CM

## 2013-11-19 ENCOUNTER — Ambulatory Visit
Admission: RE | Admit: 2013-11-19 | Discharge: 2013-11-19 | Disposition: A | Discharge status: Home / Self Care | Payer: BC Managed Care – PPO | Source: Ambulatory Visit

## 2013-11-19 DIAGNOSIS — Z1231 Encounter for screening mammogram for malignant neoplasm of breast: Secondary | ICD-10-CM

## 2013-12-07 ENCOUNTER — Other Ambulatory Visit: Payer: Self-pay | Admitting: Family Medicine

## 2014-01-01 ENCOUNTER — Other Ambulatory Visit: Payer: Self-pay | Admitting: Family Medicine

## 2014-03-19 ENCOUNTER — Ambulatory Visit (INDEPENDENT_AMBULATORY_CARE_PROVIDER_SITE_OTHER): Payer: BC Managed Care – PPO | Admitting: General Surgery

## 2014-03-19 ENCOUNTER — Encounter (INDEPENDENT_AMBULATORY_CARE_PROVIDER_SITE_OTHER): Payer: Self-pay | Admitting: General Surgery

## 2014-03-19 VITALS — BP 122/84 | HR 80 | Temp 98.4°F | Resp 16 | Ht 67.0 in | Wt 330.6 lb

## 2014-03-19 DIAGNOSIS — K432 Incisional hernia without obstruction or gangrene: Secondary | ICD-10-CM

## 2014-03-19 NOTE — Patient Instructions (Signed)
Follow up with Dr. Rosendo Gros in 2-3 months for new hernia evaluation.  Stop applying any ointment to abdominal wound.  Try to leave open to air as long as there is no drainage.    Continue wearing abdominal support.  Call if you develop sudden pain/swelling that will not go down, nausea/vomiting.

## 2014-03-19 NOTE — Assessment & Plan Note (Signed)
No evidence of incarceration or obstruction.  Advised to keep wearing the binder.  I will refer to one of the surgeons who does more hernia repairs to discuss fixing this hernia.  Also, she still has a small area that has not epithelialized.  I applied silver nitrate and a dry non stick gauze.

## 2014-03-19 NOTE — Progress Notes (Signed)
HISTORY: Pt is s/p repair of incarcerated hernia in February.  She required multiple operations to close wound.  She has had wound infection that has been very slow to heal.    She is finally doing well in terms of energy.  She is wearing the abdominal binder.  I got a CT scan to evaluate the hernia.  She has a large hernia with small and large bowel in it. She denies obstructive symptoms.  She continues to have a little spot at the inferior portion of the wound that is bothering her.    PERTINENT REVIEW OF SYSTEMS: Otherwise negative.    Filed Vitals:   03/19/14 1138  BP: 122/84  Pulse: 80  Temp: 98.4 F (36.9 C)  Resp: 16   Filed Weights   03/19/14 1138  Weight: 330 lb 9.6 oz (149.959 kg)     EXAM: Head: Normocephalic and atraumatic.  Eyes:  Conjunctivae are normal. Pupils are equal, round, and reactive to light. No scleral icterus.  Resp: No respiratory distress, normal effort. Abd:  Abdomen is soft, non distended and non tender. Recurrent hernia at incision.  No masses are palpable.  There is no rebound and no guarding. There is some shiny portion at the inferior aspect that has not fully epithelialzed. Neurological:  Alert and oriented to person, place, and time. Coordination normal.  Skin: Skin is warm and dry. No rash noted. No diaphoretic. No erythema. No pallor.  Psychiatric: Normal mood and affect. Normal behavior. Judgment and thought content normal.      ASSESSMENT AND PLAN:   Recurrent ventral incisional hernia No evidence of incarceration or obstruction.  Advised to keep wearing the binder.  I will refer to one of the surgeons who does more hernia repairs to discuss fixing this hernia.  Also, she still has a small area that has not epithelialized.  I applied silver nitrate and a dry non stick gauze.         Milus Height, MD Surgical Oncology, [facility]  Kenn File, MD Timmothy Euler,  MD

## 2014-04-24 ENCOUNTER — Other Ambulatory Visit: Payer: Self-pay | Admitting: Family Medicine

## 2014-05-10 ENCOUNTER — Encounter: Payer: Self-pay | Admitting: Family Medicine

## 2014-05-10 ENCOUNTER — Ambulatory Visit (INDEPENDENT_AMBULATORY_CARE_PROVIDER_SITE_OTHER): Payer: BC Managed Care – PPO | Admitting: Family Medicine

## 2014-05-10 VITALS — BP 125/85 | HR 101 | Temp 98.1°F | Ht 66.0 in | Wt 315.1 lb

## 2014-05-10 DIAGNOSIS — I10 Essential (primary) hypertension: Secondary | ICD-10-CM

## 2014-05-10 DIAGNOSIS — E785 Hyperlipidemia, unspecified: Secondary | ICD-10-CM

## 2014-05-10 DIAGNOSIS — E119 Type 2 diabetes mellitus without complications: Secondary | ICD-10-CM

## 2014-05-10 DIAGNOSIS — K421 Umbilical hernia with gangrene: Secondary | ICD-10-CM

## 2014-05-10 LAB — BASIC METABOLIC PANEL
BUN: 15 mg/dL (ref 6–23)
CALCIUM: 10 mg/dL (ref 8.4–10.5)
CHLORIDE: 98 meq/L (ref 96–112)
CO2: 20 meq/L (ref 19–32)
Creat: 1.04 mg/dL (ref 0.50–1.10)
GLUCOSE: 376 mg/dL — AB (ref 70–99)
Potassium: 4.2 mEq/L (ref 3.5–5.3)
Sodium: 134 mEq/L — ABNORMAL LOW (ref 135–145)

## 2014-05-10 LAB — CBC WITH DIFFERENTIAL/PLATELET
Basophils Absolute: 0.1 10*3/uL (ref 0.0–0.1)
Basophils Relative: 1 % (ref 0–1)
EOS ABS: 0.2 10*3/uL (ref 0.0–0.7)
EOS PCT: 2 % (ref 0–5)
HEMATOCRIT: 37.5 % (ref 36.0–46.0)
Hemoglobin: 12.8 g/dL (ref 12.0–15.0)
LYMPHS ABS: 3.3 10*3/uL (ref 0.7–4.0)
LYMPHS PCT: 33 % (ref 12–46)
MCH: 27.6 pg (ref 26.0–34.0)
MCHC: 34.1 g/dL (ref 30.0–36.0)
MCV: 80.8 fL (ref 78.0–100.0)
MONO ABS: 0.6 10*3/uL (ref 0.1–1.0)
Monocytes Relative: 6 % (ref 3–12)
Neutro Abs: 5.8 10*3/uL (ref 1.7–7.7)
Neutrophils Relative %: 58 % (ref 43–77)
Platelets: 368 10*3/uL (ref 150–400)
RBC: 4.64 MIL/uL (ref 3.87–5.11)
RDW: 13.8 % (ref 11.5–15.5)
WBC: 10 10*3/uL (ref 4.0–10.5)

## 2014-05-10 LAB — LDL CHOLESTEROL, DIRECT: Direct LDL: 150 mg/dL — ABNORMAL HIGH

## 2014-05-10 LAB — POCT GLYCOSYLATED HEMOGLOBIN (HGB A1C): Hemoglobin A1C: 12.5

## 2014-05-10 MED ORDER — ONETOUCH ULTRA 2 W/DEVICE KIT: 1.0000 | PACK | Status: DC | PRN

## 2014-05-10 MED ORDER — BAYER MICROLET LANCETS MISC
Status: DC
Start: 2014-05-10 — End: 2016-08-06

## 2014-05-10 MED ORDER — ONETOUCH ULTRASOFT LANCETS MISC: Status: DC

## 2014-05-10 MED ORDER — GLIPIZIDE ER 10 MG PO TB24: 10.0000 mg | ORAL_TABLET | Freq: Every day | ORAL | Status: DC

## 2014-05-10 MED ORDER — GLUCOSE BLOOD VI STRP: 1.0000 | ORAL_STRIP | Freq: Every day | Status: DC

## 2014-05-10 MED ORDER — BAYER CONTOUR MONITOR W/DEVICE KIT: 1.0000 | PACK | Status: DC | PRN

## 2014-05-10 NOTE — Assessment & Plan Note (Signed)
Has followup scheduled with Dr. Rosendo Gros who is a hernia specialist with [facility].

## 2014-05-10 NOTE — Assessment & Plan Note (Signed)
Uncontrolled, drastic change from previous A1c of 7.4-12.5 today. Discussed aggressive lifestyle change which she was to try. Also started glipizide 10 mg twice a day Also prescribed glucose meter and asked her to keep a log of her CBGs. Followup one month, will likely need to start insulin if she does not improve in the next one to 3 months. I discussed this option with her at length.

## 2014-05-10 NOTE — Patient Instructions (Signed)
Great to see you today!  I have prescribed a meter  Please keep track of your blood sugars and come back in 1 month

## 2014-05-10 NOTE — Progress Notes (Signed)
Patient ID: Erin Schultz, female   DOB: [birth year], 48 y.o.   MRN: [id number]  Kenn File, MD Phone: [phone]  Subjective:  Chief complaint-noted  Pt Here for IV use, hypertension, hyperlipidemia  Diabetes Patient states that she's been watching her diet very well since her last note, she notes that she's exercising with walking 20 minutes a day at least 3 times a week. She has cut out sodas and cut back on her carbohydrate, including ice cream, in take. She does not have strips at home and so has not checked her blood sugar very frequently, she is checked once her mothers where it was 386  Hypertension No headache, chest pain, dyspnea, or palpitations Some stable lower extremity edema Takes her medications everyday, does not check her blood pressure at home Avoids greasy and fatty foods  Hyperlipidemia Avoids fatty and greasy foods, no medications  Ventral hernia Has an appointment with Dr. Rosendo Gros, general surgery who specializes in hernias, on July 9. No complaints currently   ROS-  Past Medical History Patient Active Problem List   Diagnosis Date Noted  . Recurrent ventral incisional hernia 09/02/2013  . Open abdominal wall wound with VAC 01/27/2013  . Hernia, umbilical, with gangrene 12/20/2012  . Protein-calorie malnutrition, moderate 12/20/2012  . DIABETES MELLITUS, TYPE II 04/06/2010  . HYPERLIPIDEMIA 10/29/2008  . DERMATITIS, ATOPIC 10/03/2007  . OBESITY, NOS 01/09/2007  . HYPERTENSION, BENIGN SYSTEMIC 01/09/2007  . RHINITIS, ALLERGIC 01/09/2007    Medications- reviewed and updated Current Outpatient Prescriptions  Medication Sig Dispense Refill  . amLODipine-benazepril (LOTREL) 5-40 MG per capsule TAKE ONE CAPSULE EVERY DAY  90 capsule  3  . Blood Glucose Monitoring Suppl (ONE TOUCH ULTRA 2) W/DEVICE KIT 1 Device by Does not apply route as needed.  1 each  0  . desloratadine (CLARINEX) 5 MG tablet Take 1 tablet (5 mg total) by mouth daily.  30  tablet  11  . glucose blood (ONE TOUCH ULTRA TEST) test strip 1 each by Other route daily. Use as instructed  50 each  11  . Lancets (ONETOUCH ULTRASOFT) lancets Use as instructed  100 each  12  . metFORMIN (GLUCOPHAGE) 1000 MG tablet TAKE 1 TABLET BY MOUTH TWICE A DAY WITH MEALS  60 tablet  11  . Multiple Vitamins-Minerals (MULTI-VITAMIN GUMMIES) CHEW Chew by mouth.      . triamterene-hydrochlorothiazide (MAXZIDE-25) 37.5-25 MG per tablet Take 1 tablet by mouth daily.  102 tablet  3   No current facility-administered medications for this visit.    Objective: BP 147/70  Pulse 101  Temp(Src) 98.1 F (36.7 C) (Oral)  Ht $R'5\' 6"'un$  (1.676 m)  Wt 315 lb 1.6 oz (142.928 kg)  BMI 50.88 kg/m2 Gen: NAD, alert, cooperative with exam HEENT: NCAT CV: RRR, good S1/S2, no murmur Resp: CTABL, no wheezes, non-labored Abd: Obese, + BS Ext: 1+ pitting edema BL LE Neuro: Alert and oriented, No gross deficits   Assessment/Plan:  DIABETES MELLITUS, TYPE II Uncontrolled, drastic change from previous A1c of 7.4-12.5 today. Discussed aggressive lifestyle change which she was to try. Also started glipizide 10 mg twice a day Also prescribed glucose meter and asked her to keep a log of her CBGs. Followup one month, will likely need to start insulin if she does not improve in the next one to 3 months. I discussed this option with her at length.  Hernia, umbilical, with gangrene Has followup scheduled with Dr. Rosendo Gros who is a hernia specialist with [facility].  HYPERLIPIDEMIA Checking direct  LDL today Considering statin with her poor diabetes control She's open to this if needed  HYPERTENSION, BENIGN SYSTEMIC Initially uncontrolled, however improved with 5 minutes of rest to 120s over 80s Continue current medications including amlodipine, benazepril, Maxzide Red flags, followup one month    Orders Placed This Encounter  Procedures  . POCT HgB A1C    Meds ordered this encounter  Medications  .  Blood Glucose Monitoring Suppl (ONE TOUCH ULTRA 2) W/DEVICE KIT    Sig: 1 Device by Does not apply route as needed.    Dispense:  1 each    Refill:  0  . glucose blood (ONE TOUCH ULTRA TEST) test strip    Sig: 1 each by Other route daily. Use as instructed    Dispense:  50 each    Refill:  11  . Lancets (ONETOUCH ULTRASOFT) lancets    Sig: Use as instructed    Dispense:  100 each    Refill:  12

## 2014-05-10 NOTE — Assessment & Plan Note (Signed)
Initially uncontrolled, however improved with 5 minutes of rest to 120s over 80s Continue current medications including amlodipine, benazepril, Maxzide Red flags, followup one month

## 2014-05-10 NOTE — Assessment & Plan Note (Signed)
Checking direct LDL today Considering statin with her poor diabetes control She's open to this if needed

## 2014-05-13 ENCOUNTER — Encounter: Payer: Self-pay | Admitting: Family Medicine

## 2014-05-20 ENCOUNTER — Ambulatory Visit (INDEPENDENT_AMBULATORY_CARE_PROVIDER_SITE_OTHER): Payer: BC Managed Care – PPO | Admitting: General Surgery

## 2014-05-20 ENCOUNTER — Encounter (INDEPENDENT_AMBULATORY_CARE_PROVIDER_SITE_OTHER): Payer: Self-pay | Admitting: General Surgery

## 2014-05-20 VITALS — BP 123/80 | HR 77 | Temp 98.6°F | Resp 16 | Ht 67.0 in | Wt 324.6 lb

## 2014-05-20 DIAGNOSIS — K432 Incisional hernia without obstruction or gangrene: Secondary | ICD-10-CM

## 2014-05-20 NOTE — Progress Notes (Signed)
Patient ID: Erin Schultz, female   DOB: 08/04/1966, 48 y.o.   MRN: [id number]  Chief Complaint  Patient presents with  . Hernia    HPI Erin Schultz is a 48 y.o. female.  The patient is a 48 year old young lady referred by Dr. Barry Dienes for evaluation of a recurrent incisional hernia. The patient previously underwent exploratory laparotomy February 2014 secondary small bowel function and hernia. The patient subsequently had an open abdomen with a difficult to close the abdominal wall. This was ultimately closed with Novafil stitches as well as biologic mesh inlay.  The patient states that she's not had any pain or any type of incarcerated type symptoms. Dr.  Barry Dienes recently ordered a CT scan to await her hernia. This shows a large approximately 21 cm hernia with loss of domain.  The patient does say she has a history of diabetes in her last hemoglobin A1c level was 4.1. She states she is trying to keep her blood sugars down as well as losing weight. She states that she is also approximately 10 pounds in the last one to 2 months.   HPI  Past Medical History  Diagnosis Date  . Hypertension   . Diabetes mellitus without complication   . Chronic kidney disease   . H/O hiatal hernia   . Acute peritonitis 12/20/2012  . Acute respiratory failure with hypoxia 12/20/2012  . Septic shock(785.52) 12/20/2012  . Severe sepsis(995.92) 12/20/2012  . Small bowel obstruction 12/20/2012    Past Surgical History  Procedure Laterality Date  . Cholecystectomy    . Appendectomy    . Abdominal hysterectomy    . Laparotomy  12/15/2012    Procedure: EXPLORATORY LAPAROTOMY;  Surgeon: Stark Klein, MD;  Location: [facility];  Service: General;  Laterality: N/A;  . Vacuum assisted closure change  12/17/2012    Procedure: ABDOMINAL VACUUM ASSISTED CLOSURE CHANGE;  Surgeon: Adin Hector, MD;  Location: [facility];  Service: General;  Laterality: N/A;  EX Lap with vac change  . Laparotomy N/A 12/19/2012    Procedure: EXPLORATORY  LAPAROTOMY;  Surgeon: Adin Hector, MD;  Location: [facility];  Service: General;  Laterality: N/A;  . Vacuum assisted closure change N/A 12/19/2012    Procedure: ABDOMINAL VACUUM ASSISTED CLOSURE CHANGE;  Surgeon: Adin Hector, MD;  Location: [facility];  Service: General;  Laterality: N/A;  . Vacuum assisted closure change N/A 12/22/2012    Procedure: ABDOMINAL VACUUM ASSISTED CLOSURE CHANGE;  Surgeon: Shann Medal, MD;  Location: [facility];  Service: General;  Laterality: N/A;  . Vacuum assisted closure change N/A 12/24/2012    Procedure: ABDOMINAL VACUUM ASSISTED CLOSURE CHANGE;  Surgeon: Shann Medal, MD;  Location[facility];  Service: General;  Laterality: N/A;    Family History  Problem Relation Age of Onset  . Diabetes Father   . Diabetes Brother   . Hypertension Father     Social History History  Substance Use Topics  . Smoking status: Never Smoker   . Smokeless tobacco: Not on file  . Alcohol Use: No    Allergies  Allergen Reactions  . Corn-Containing Products Nausea And Vomiting    Stomach inflamed  . Sulfa Antibiotics     Childhood reaction    Current Outpatient Prescriptions  Medication Sig Dispense Refill  . amLODipine-benazepril (LOTREL) 5-40 MG per capsule TAKE ONE CAPSULE EVERY DAY  90 capsule  3  . BAYER MICROLET LANCETS lancets Use as instructed  100 each  12  . Blood Glucose  Monitoring Suppl (BAYER CONTOUR MONITOR) W/DEVICE KIT 1 Device by Does not apply route as needed.  1 kit  0  . desloratadine (CLARINEX) 5 MG tablet Take 1 tablet (5 mg total) by mouth daily.  30 tablet  11  . glipiZIDE (GLUCOTROL XL) 10 MG 24 hr tablet Take 1 tablet (10 mg total) by mouth daily with breakfast.  30 tablet  2  . glucose blood (BAYER CONTOUR TEST) test strip 1 each by Other route daily. Use as instructed  100 each  12  . metFORMIN (GLUCOPHAGE) 1000 MG tablet TAKE 1 TABLET BY MOUTH TWICE A DAY WITH MEALS  60 tablet  11  . Multiple Vitamins-Minerals (MULTI-VITAMIN GUMMIES) CHEW Chew  by mouth.      . triamterene-hydrochlorothiazide (MAXZIDE-25) 37.5-25 MG per tablet Take 1 tablet by mouth daily.  102 tablet  3   No current facility-administered medications for this visit.    Review of Systems Review of Systems  Constitutional: Negative.   HENT: Negative.   Respiratory: Negative.   Cardiovascular: Negative.   Gastrointestinal: Negative.   Neurological: Negative.   All other systems reviewed and are negative.   Blood pressure 123/80, pulse 77, temperature 98.6 F (37 C), temperature source Temporal, resp. rate 16, height _0  (1.702 m), weight 324 lb 9.6 oz (147.238 kg).  Physical Exam Physical Exam  Constitutional: She is oriented to person, place, and time. She appears well-developed and well-nourished.  HENT:  Head: Normocephalic and atraumatic.  Eyes: Conjunctivae and EOM are normal. Pupils are equal, round, and reactive to light.  Neck: Normal range of motion. Neck supple.  Cardiovascular: Normal rate, regular rhythm and normal heart sounds.   Pulmonary/Chest: Effort normal and breath sounds normal.  Abdominal: A hernia is present. Hernia confirmed positive in the ventral area (large ventral herniaapproximately 21 cm in size, small area of granulation tissue in the midline from the previous wound).  Musculoskeletal: Normal range of motion.  Neurological: She is alert and oriented to person, place, and time.  Skin: Skin is warm and dry.  Psychiatric: She has a normal mood and affect.    Data Reviewed CT scan reviewed as above  Assessment    48 year old female with a large ventral hernia in loss of domain. Patient also with morbid obesity as well as diabetes which complicates her issue.w     Plan    1. Had a long discussion with the patient in regards to hernia surgery that would be required to fix her incisional hernia. I discussed with her the risks of recurrence which included diabetes as well as obesity and the need to get these under better  control prior to moving forward with surgery.   I first patient to lose approximately 60 pounds prior to discussing surgery. We'll have the patient follow back in 6 months to assess her weight loss and diabetes control that time.  Ultimately the patient would likely need a full components separation technique to help reapproximate her abdominal wall as well as an underlay of mesh. Prior to any surgery the patient will likely need a screening colonoscopy. 2. Pt to followup in 6 months.       Rosario Jacks., Colton Engdahl 05/20/2014, 10:45 AM

## 2014-06-06 ENCOUNTER — Other Ambulatory Visit: Payer: Self-pay | Admitting: Family Medicine

## 2014-06-07 ENCOUNTER — Encounter: Payer: Self-pay | Admitting: Family Medicine

## 2014-06-07 ENCOUNTER — Ambulatory Visit (INDEPENDENT_AMBULATORY_CARE_PROVIDER_SITE_OTHER): Payer: BC Managed Care – PPO | Admitting: Family Medicine

## 2014-06-07 VITALS — BP 128/81 | HR 87 | Temp 97.5°F | Resp 16 | Wt 325.0 lb

## 2014-06-07 DIAGNOSIS — E119 Type 2 diabetes mellitus without complications: Secondary | ICD-10-CM

## 2014-06-07 NOTE — Patient Instructions (Signed)
Great to see you!  You are doing great, keep it up!  I would recommend cholesterol medicine, I would try pravastatin   Please follow up in 1 month.   Diabetes and Exercise Exercising regularly is important. It is not just about losing weight. It has many health benefits, such as:  Improving your overall fitness, flexibility, and endurance.  Increasing your bone density.  Helping with weight control.  Decreasing your body fat.  Increasing your muscle strength.  Reducing stress and tension.  Improving your overall health. People with diabetes who exercise gain additional benefits because exercise:  Reduces appetite.  Improves the body's use of blood sugar (glucose).  Helps lower or control blood glucose.  Decreases blood pressure.  Helps control blood lipids (such as cholesterol and triglycerides).  Improves the body's use of the hormone insulin by:  Increasing the body's insulin sensitivity.  Reducing the body's insulin needs.  Decreases the risk for heart disease because exercising:  Lowers cholesterol and triglycerides levels.  Increases the levels of good cholesterol (such as high-density lipoproteins [HDL]) in the body.  Lowers blood glucose levels. YOUR ACTIVITY PLAN  Choose an activity that you enjoy and set realistic goals. Your health care provider or diabetes educator can help you make an activity plan that works for you. Exercise regularly as directed by your health care provider. This includes:  Performing resistance training twice a week such as push-ups, sit-ups, lifting weights, or using resistance bands.  Performing 150 minutes of cardio exercises each week such as walking, running, or playing sports.  Staying active and spending no more than 90 minutes at one time being inactive. Even short bursts of exercise are good for you. Three 10-minute sessions spread throughout the day are just as beneficial as a single 30-minute session. Some exercise  ideas include:  Taking the dog for a walk.  Taking the stairs instead of the elevator.  Dancing to your favorite song.  Doing an exercise video.  Doing your favorite exercise with a friend. RECOMMENDATIONS FOR EXERCISING WITH TYPE 1 OR TYPE 2 DIABETES   Check your blood glucose before exercising. If blood glucose levels are greater than 240 mg/dL, check for urine ketones. Do not exercise if ketones are present.  Avoid injecting insulin into areas of the body that are going to be exercised. For example, avoid injecting insulin into:  The arms when playing tennis.  The legs when jogging.  Keep a record of:  Food intake before and after you exercise.  Expected peak times of insulin action.  Blood glucose levels before and after you exercise.  The type and amount of exercise you have done.  Review your records with your health care provider. Your health care provider will help you to develop guidelines for adjusting food intake and insulin amounts before and after exercising.  If you take insulin or oral hypoglycemic agents, watch for signs and symptoms of hypoglycemia. They include:  Dizziness.  Shaking.  Sweating.  Chills.  Confusion.  Drink plenty of water while you exercise to prevent dehydration or heat stroke. Body water is lost during exercise and must be replaced.  Talk to your health care provider before starting an exercise program to make sure it is safe for you. Remember, almost any type of activity is better than none. Document Released: 01/19/2004 Document Revised: 03/15/2014 Document Reviewed: 04/07/2013 [facility] Patient Information 2015 [place], [place]. This information is not intended to replace advice given to you by your health care provider. Make sure  you discuss any questions you have with your health care provider.

## 2014-06-07 NOTE — Assessment & Plan Note (Signed)
Improving COntinue aggressive lifestyle and current meds, Metformin and glipizide Check A1C in 2 months, would strongly consider insulin if not at A1C of 9 by then Recommend statin, she would like to wait

## 2014-06-07 NOTE — Progress Notes (Signed)
Patient ID: Erin Schultz, female   DOB: 05-Mar-1966, 48 y.o.   MRN: [id number]  Kenn File, MD Phone: [phone]  Subjective:  Chief complaint-noted  Pt Here for followup diabetes  Patient states that since our last meeting she's been drastic lifestyle changes. She's now walking for 20-30 minutes about 3 times a week. She's watching her diet, eating less carbohydrates and more leafy greens and lean meats.  She has started glipizide 10 mg twice a day and has not had any hypoglycemic events She got her glucose monitor and her blood sugars have improved from the 230s when she first started to 120 -130 fasting over the last few weeks.   ROS- Per history of present illness  Past Medical History Patient Active Problem List   Diagnosis Date Noted  . Recurrent ventral incisional hernia 09/02/2013  . Open abdominal wall wound with VAC 01/27/2013  . Hernia, umbilical, with gangrene 12/20/2012  . Protein-calorie malnutrition, moderate 12/20/2012  . DIABETES MELLITUS, TYPE II 04/06/2010  . HYPERLIPIDEMIA 10/29/2008  . DERMATITIS, ATOPIC 10/03/2007  . OBESITY, NOS 01/09/2007  . HYPERTENSION, BENIGN SYSTEMIC 01/09/2007  . RHINITIS, ALLERGIC 01/09/2007    Medications- reviewed and updated Current Outpatient Prescriptions  Medication Sig Dispense Refill  . amLODipine-benazepril (LOTREL) 5-40 MG per capsule TAKE ONE CAPSULE EVERY DAY  90 capsule  3  . BAYER MICROLET LANCETS lancets Use as instructed  100 each  12  . Blood Glucose Monitoring Suppl (BAYER CONTOUR MONITOR) W/DEVICE KIT 1 Device by Does not apply route as needed.  1 kit  0  . desloratadine (CLARINEX) 5 MG tablet Take 1 tablet (5 mg total) by mouth daily.  30 tablet  11  . glipiZIDE (GLUCOTROL XL) 10 MG 24 hr tablet Take 1 tablet (10 mg total) by mouth daily with breakfast.  30 tablet  2  . glucose blood (BAYER CONTOUR TEST) test strip 1 each by Other route daily. Use as instructed  100 each  12  . metFORMIN (GLUCOPHAGE)  1000 MG tablet TAKE 1 TABLET BY MOUTH TWICE A DAY WITH MEALS  60 tablet  11  . Multiple Vitamins-Minerals (MULTI-VITAMIN GUMMIES) CHEW Chew by mouth.      . triamterene-hydrochlorothiazide (MAXZIDE-25) 37.5-25 MG per tablet Take 1 tablet by mouth daily.  102 tablet  3   No current facility-administered medications for this visit.    Objective: BP 128/81  Pulse 87  Temp(Src) 97.5 F (36.4 C) (Oral)  Resp 16  Wt 325 lb (147.419 kg)  SpO2 100% Gen: NAD, alert, cooperative with exam HEENT: NCAT CV: RRR, good S1/S2, no murmur Resp: CTABL, no wheezes, non-labored Abd: SNTND, BS present, no guarding or organomegaly Ext: No edema, warm Neuro: Alert and oriented, No gross deficits   Assessment/Plan:  DIABETES MELLITUS, TYPE II Improving COntinue aggressive lifestyle and current meds, Metformin and glipizide Check A1C in 2 months, would strongly consider insulin if not at A1C of 9 by then Recommend statin, she would like to wait

## 2014-07-31 ENCOUNTER — Other Ambulatory Visit: Payer: Self-pay | Admitting: Family Medicine

## 2014-09-07 ENCOUNTER — Other Ambulatory Visit: Payer: Self-pay | Admitting: *Deleted

## 2014-09-07 MED ORDER — TRIAMTERENE-HCTZ 37.5-25 MG PO TABS: 1.0000 | ORAL_TABLET | Freq: Every day | ORAL | Status: DC

## 2014-09-07 MED ORDER — AMLODIPINE BESY-BENAZEPRIL HCL 5-40 MG PO CAPS: 1.0000 | ORAL_CAPSULE | Freq: Every day | ORAL | Status: DC

## 2014-09-07 MED ORDER — GLIPIZIDE ER 10 MG PO TB24: 10.0000 mg | ORAL_TABLET | Freq: Every day | ORAL | Status: DC

## 2014-09-07 MED ORDER — METFORMIN HCL 1000 MG PO TABS: 1000.0000 mg | ORAL_TABLET | Freq: Two times a day (BID) | ORAL | Status: DC

## 2014-09-07 NOTE — Telephone Encounter (Signed)
Request for 90 day supply for all medications.  Derl Barrow, RN

## 2014-09-07 NOTE — Telephone Encounter (Signed)
Refilled HTN and DM2 meds, will ask nursing to call and encourage follow up.   Laroy Apple, MD [place] [facility] Resident, PGY-3 09/07/2014, 9:34 AM

## 2014-09-20 ENCOUNTER — Other Ambulatory Visit: Payer: Self-pay | Admitting: *Deleted

## 2014-09-20 MED ORDER — TRIAMTERENE-HCTZ 37.5-25 MG PO TABS: 1.0000 | ORAL_TABLET | Freq: Every day | ORAL | Status: DC

## 2014-09-20 MED ORDER — METFORMIN HCL 1000 MG PO TABS: 1000.0000 mg | ORAL_TABLET | Freq: Two times a day (BID) | ORAL | Status: DC

## 2014-09-28 ENCOUNTER — Other Ambulatory Visit: Payer: Self-pay | Admitting: *Deleted

## 2014-09-28 MED ORDER — GLIPIZIDE ER 10 MG PO TB24: 10.0000 mg | ORAL_TABLET | Freq: Every day | ORAL | Status: DC

## 2014-09-28 MED ORDER — AMLODIPINE BESY-BENAZEPRIL HCL 5-40 MG PO CAPS: 1.0000 | ORAL_CAPSULE | Freq: Every day | ORAL | Status: DC

## 2014-10-25 IMAGING — CT CT ABD-PELV W/ CM
3 of 7 series · 15 of 46 positions shown, 17 images · IV contrast (APPLIED)
Comparison: 04/10/2011 plain film exam.  No comparison CT.

CLINICAL DATA: Left lower quadrant pain with nausea over past 6
days.

CT ABDOMEN AND PELVIS WITH CONTRAST
TECHNIQUE: Multidetector CT imaging of the abdomen and pelvis was
performed following the standard protocol during bolus
administration of intravenous contrast.
Contrast: 80mL OMNIPAQUE IOHEXOL 300 MG/ML  SOLN .

[Series 2: abd/pelv with 5.0 b31f st · axial · 0.93mm/px · z∈[-547,-112]mm · 10 of 107 slices shown, 12 images]
[im 10/107  soft-tissue]
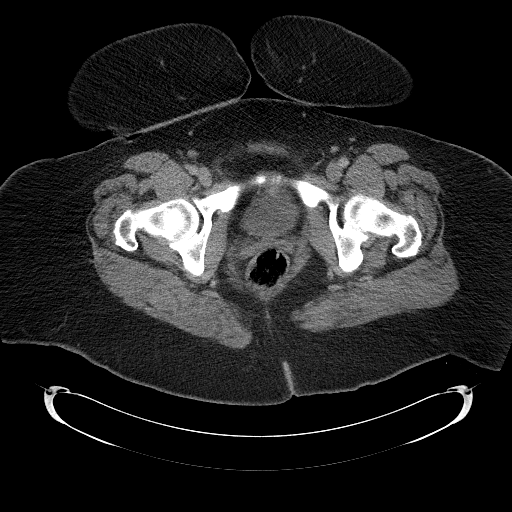
[im 10/107  bone]
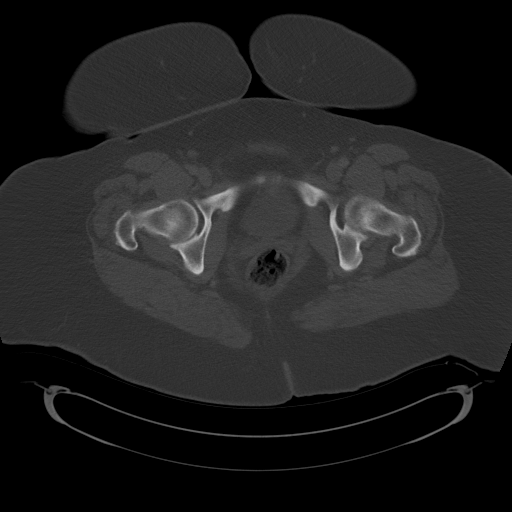
[im 20/107  soft-tissue]
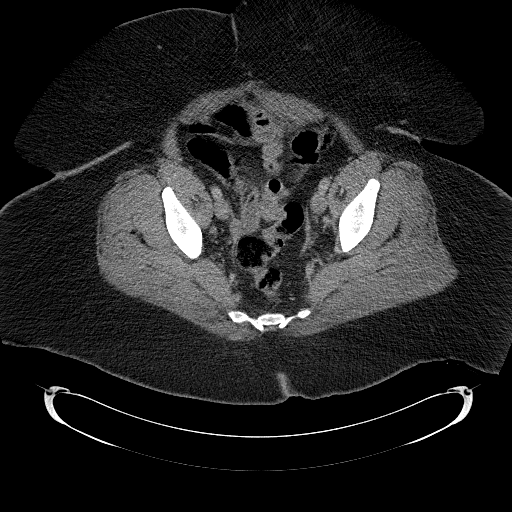
[im 29/107  soft-tissue]
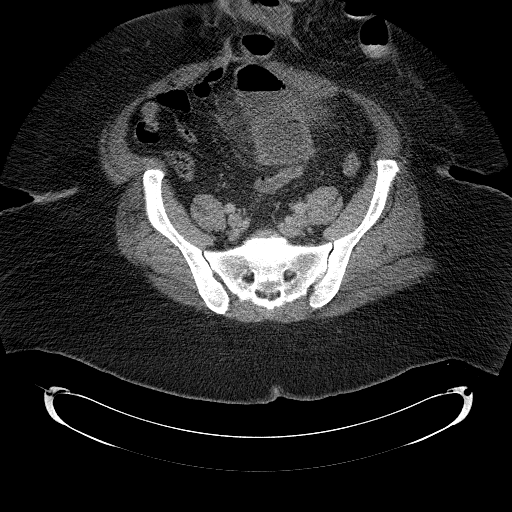
[im 39/107  soft-tissue]
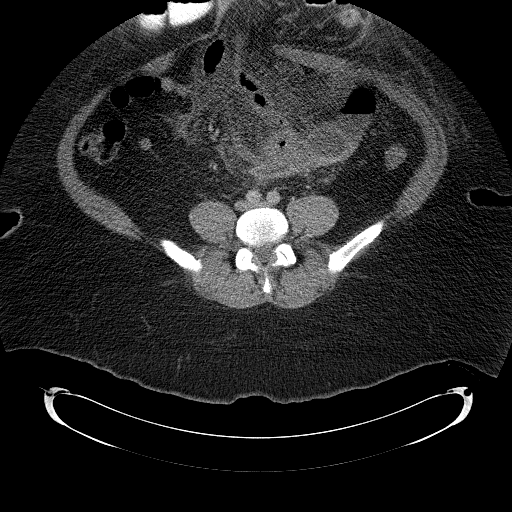
[im 49/107  soft-tissue]
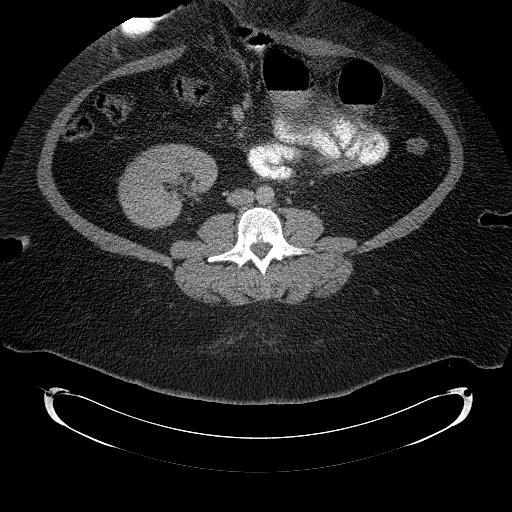
[im 58/107  soft-tissue]
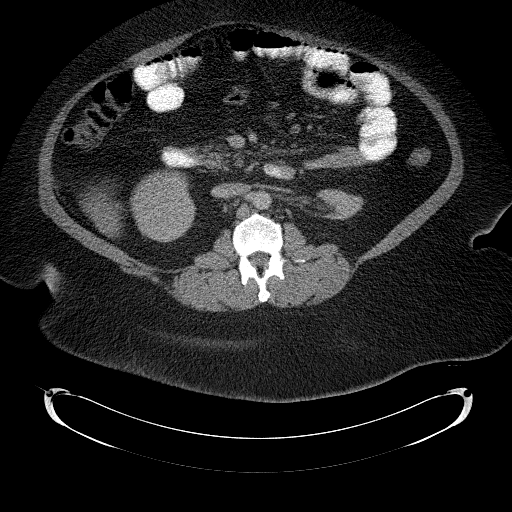
[im 68/107  soft-tissue]
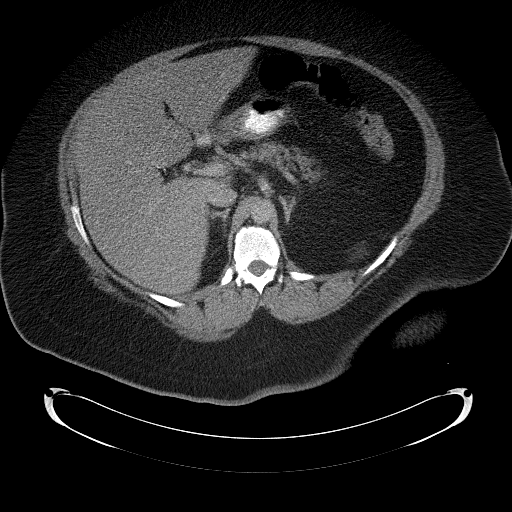
[im 78/107  soft-tissue]
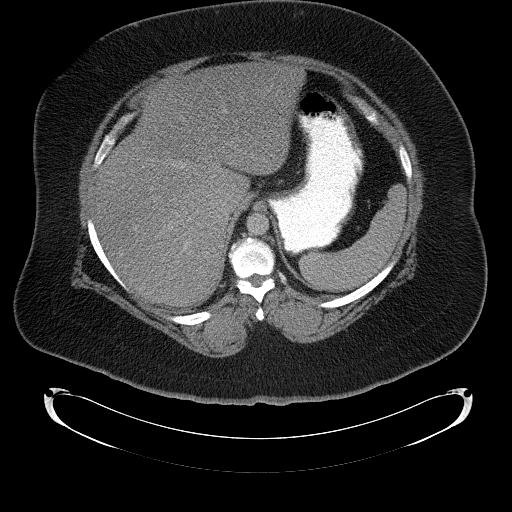
[im 87/107  soft-tissue]
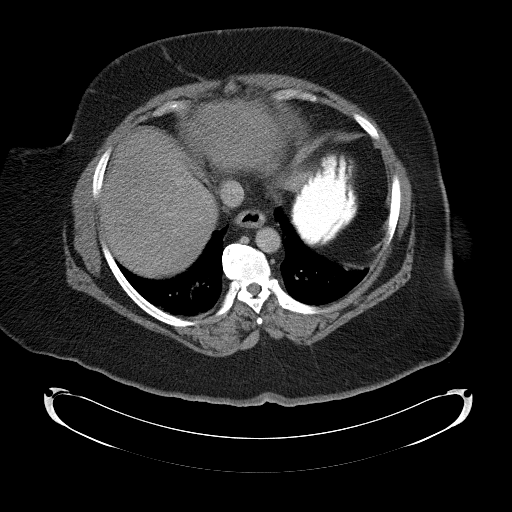
[im 87/107  bone]
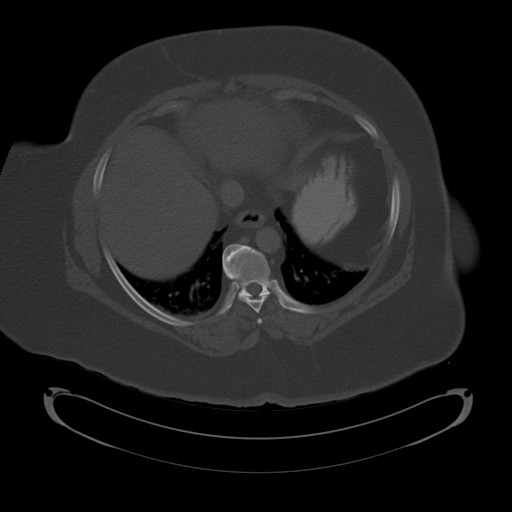
[im 97/107  soft-tissue]
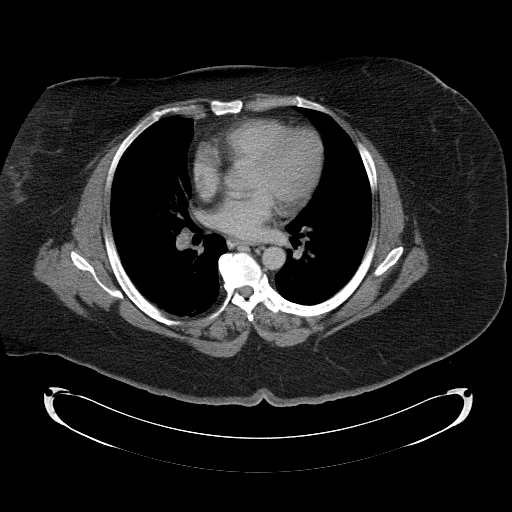

[Series 3: abd/pelv with 5.0 b70f lung · axial · 0.93mm/px · z∈[-182,-122]mm · 2 of 36 slices shown]
[im 12/36  bone]
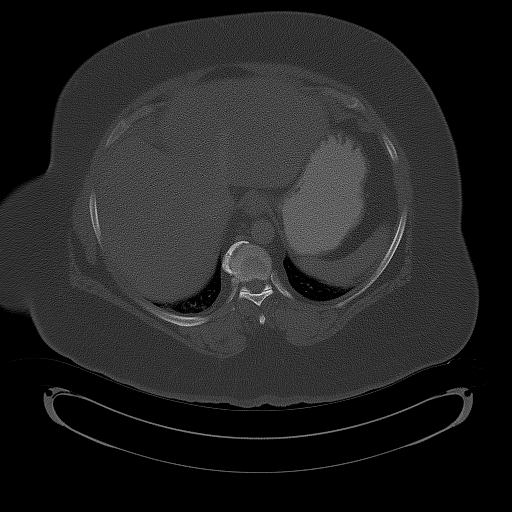
[im 24/36  bone]
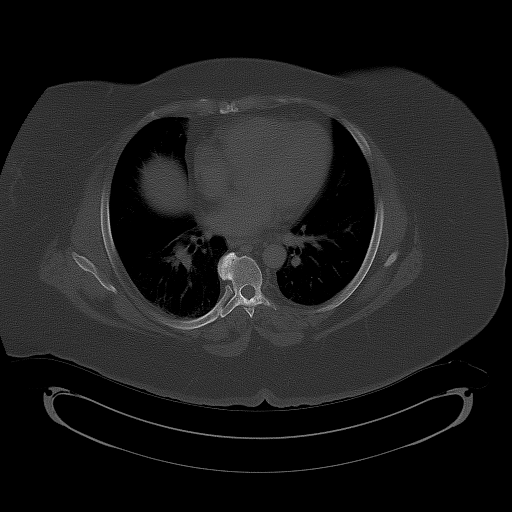

[Series 602: cor · coronal · 1.04mm/px · 3 of 168 slices shown]
[im 56/168  soft-tissue]
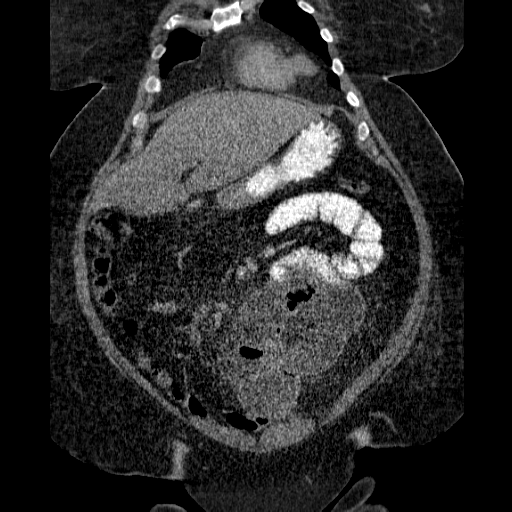
[im 75/168  soft-tissue]
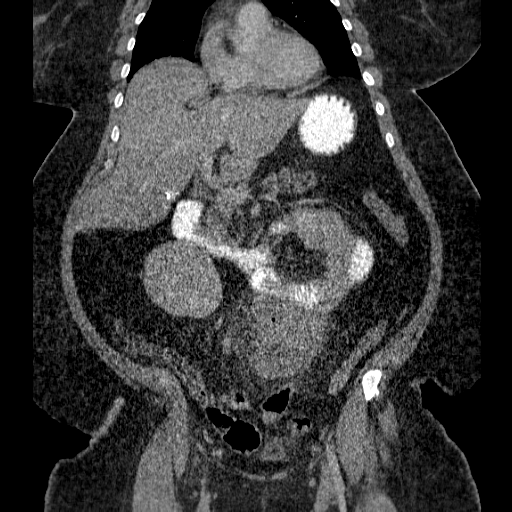
[im 93/168  soft-tissue]
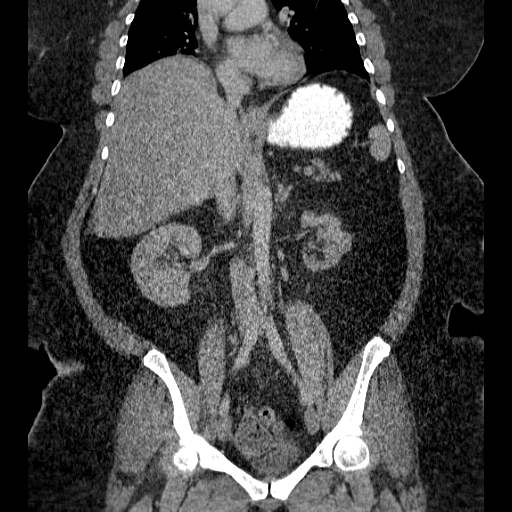

[15 of 46 positions shown; findings below may reference images not displayed]

FINDINGS: The present examination do not include the patients
entire abdomen secondary to patient's size.  The maximal field of
view was utilized.

Large anterior abdominal wall hernia through which fat and small
bowel traverses.  This is causing small bowel obstruction with
perforated bowel with contained complex air fluid collection deep
to the hernia extending into the lower abdomen and upper pelvis.

Small scarred left kidney.  Right kidney poorly functioning as on
delayed imaging of contrast is not seen within the collecting
system.

Fatty infiltration of the liver without focal mass.  Post
cholecystectomy.  No focal splenic, pancreatic or adrenal lesion.

No abdominal aortic aneurysm.

Minimal atelectatic changes lung bases.

Decompressed urinary bladder without gross abnormality.  Appearance
of prior hysterectomy.

Degenerative changes thoracic and lumbar spine without bony
destructive lesion.
IMPRESSION: The present examination do not include the patients entire abdomen
secondary to patient's size.  The maximal field of view was
utilized.

Large anterior abdominal wall hernia through which fat and small
bowel traverses.  This is causing small bowel obstruction with
perforated bowel with contained complex air fluid collection deep
to the hernia extending into the lower abdomen and upper pelvis.

Small scarred left kidney.  Right kidney poorly functioning as on
delayed imaging of contrast is not seen within the collecting
system.

Fatty infiltration of the liver.

Critical Value/emergent results were called by telephone at the
time of interpretation on 12/15/2012 at [DATE] p.m. to Am[place]rani [place]
physician's assistant, who verbally acknowledged these results.

## 2014-10-26 IMAGING — CR DG CHEST 1V PORT
1 series · 1 of 1 positions shown · non-contrast
Comparison: None.

CLINICAL DATA: Endotracheal tube position.  Line placement.

PORTABLE CHEST - 1 VIEW

[AP]
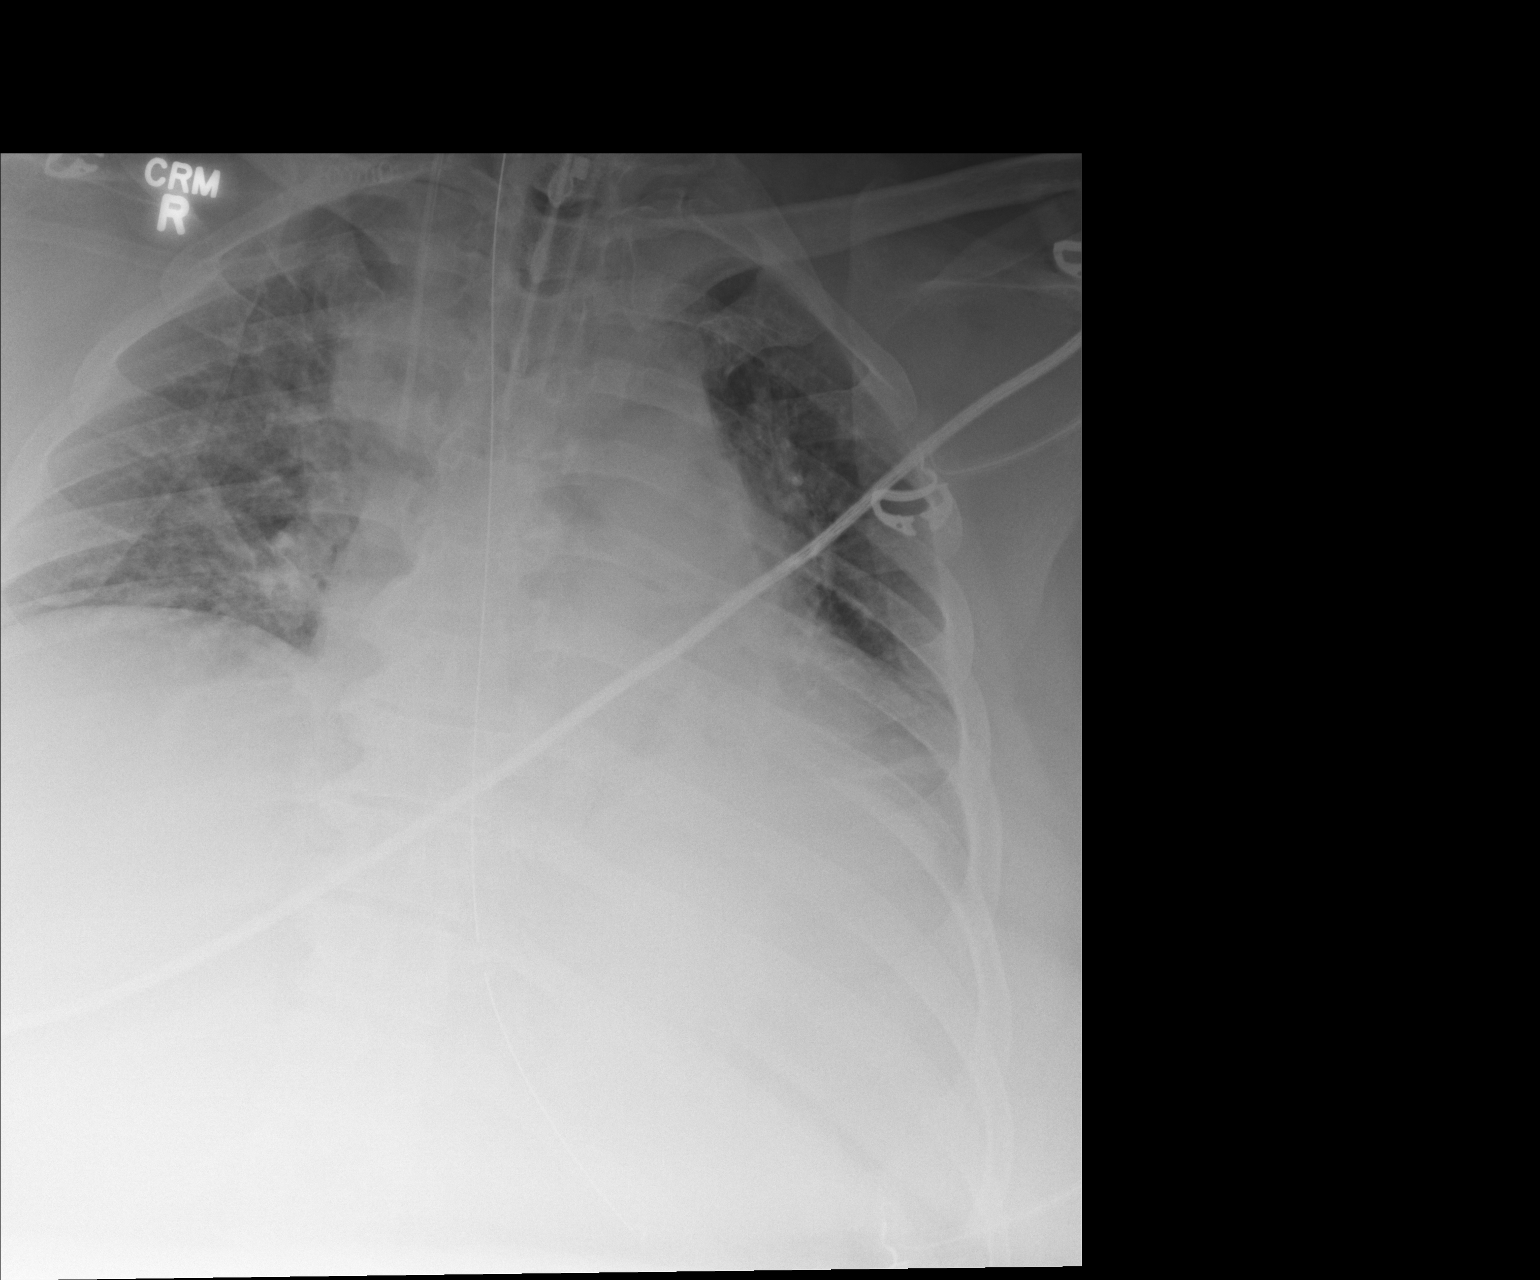

[1 of 1 positions shown; findings below may reference images not displayed]

FINDINGS: Endotracheal tube tip appears to be at or no more than 5
mm above the carina.  Enteric tube tip is in the left upper
quadrant consistent with location in the upper stomach.  Right
central venous catheter tip overlies the mid SVC region.  No
pneumothorax.  Shallow inspiration with atelectasis in the lung
bases.  Mild cardiac enlargement with borderline pulmonary vascular
congestion.
IMPRESSION: Endotracheal tube tip is at or just above the carina.  Enteric tube
and right central venous catheter locations appear appropriate.
Atelectasis in the lung bases with shallow inspiration.

Results were telephoned to the surgical ICU at 8511 hours on
12/16/2012.  I spoke with Jumper.

## 2015-02-26 ENCOUNTER — Other Ambulatory Visit: Payer: Self-pay | Admitting: Family Medicine

## 2015-02-28 NOTE — Telephone Encounter (Signed)
Refilled clarinex. Will ask nursing to notify and recommend follow up in next 4-6 weeks as she hasn't bene in for 6+ months with uncontrolled DM2.   Laroy Apple, MD [place] Resident, PGY-3 02/28/2015, 11:10 AM

## 2015-03-03 NOTE — Telephone Encounter (Signed)
Letter mailed to patient to call and schedule a follow up with pcp. Maizey Menendez,CMA

## 2015-06-30 ENCOUNTER — Other Ambulatory Visit: Payer: Self-pay | Admitting: *Deleted

## 2015-06-30 MED ORDER — GLUCOSE BLOOD VI STRP: 1.0000 | ORAL_STRIP | Freq: Every day | Status: DC

## 2015-07-21 ENCOUNTER — Other Ambulatory Visit: Payer: Self-pay | Admitting: *Deleted

## 2015-07-21 MED ORDER — DESLORATADINE 5 MG PO TABS: ORAL_TABLET | ORAL | Status: DC

## 2015-07-22 ENCOUNTER — Other Ambulatory Visit: Payer: Self-pay | Admitting: *Deleted

## 2015-07-22 NOTE — Telephone Encounter (Signed)
Refill request for 90 day supply.  Leontina Skidmore L, RN  

## 2015-07-24 MED ORDER — DESLORATADINE 5 MG PO TABS: ORAL_TABLET | ORAL | Status: DC

## 2015-09-27 ENCOUNTER — Other Ambulatory Visit: Payer: Self-pay | Admitting: Family Medicine

## 2015-09-27 NOTE — Telephone Encounter (Signed)
Pt called and needs a refill on her Metformin, Amlodipine, Glipizide, and her Triamterene-Hydrochlorothiazide. She also changed meters to the One Touch [employer]. She would need strips for this meter also. jw

## 2015-09-28 ENCOUNTER — Telehealth: Payer: Self-pay | Admitting: Family Medicine

## 2015-09-28 ENCOUNTER — Other Ambulatory Visit: Payer: Self-pay | Admitting: Family Medicine

## 2015-09-28 MED ORDER — METFORMIN HCL 1000 MG PO TABS: 1000.0000 mg | ORAL_TABLET | Freq: Two times a day (BID) | ORAL | Status: DC

## 2015-09-28 MED ORDER — TRIAMTERENE-HCTZ 37.5-25 MG PO TABS: 1.0000 | ORAL_TABLET | Freq: Every day | ORAL | Status: DC

## 2015-09-28 MED ORDER — AMLODIPINE BESY-BENAZEPRIL HCL 5-40 MG PO CAPS: 1.0000 | ORAL_CAPSULE | Freq: Every day | ORAL | Status: DC

## 2015-09-28 NOTE — Telephone Encounter (Signed)
Pt has an appt on 10/20/15 to meet with new MD. Patient will be out of amLODipine-benazepril (LOTREL) 5-40 MG, glipiZIDE (GLUCOTROL XL) 10 MG, and triamterene-hydrochlorothiazide (MAXZIDE-25) 37.5-25 MG per tablet. Patient would like to know if she could get enough of these medications to get her to her appointment. I informed patient that because she has not seen her new provider and has not been seen in over a year then I couldn't promise but I would try. Please advise patient at the earliest convenience.

## 2015-09-28 NOTE — Telephone Encounter (Signed)
Contacted patient back to verify pharmacy: [facility]. Thank you, Fonda Kinder, ASA

## 2015-10-10 ENCOUNTER — Other Ambulatory Visit: Payer: Self-pay | Admitting: Family Medicine

## 2015-10-10 MED ORDER — GLIPIZIDE ER 10 MG PO TB24: 10.0000 mg | ORAL_TABLET | Freq: Every day | ORAL | Status: DC

## 2015-10-10 NOTE — Telephone Encounter (Signed)
Refill for Glipizide was overlooked when pt's other meds were refill on 11/15.  Please refill meds asap.

## 2015-10-20 ENCOUNTER — Ambulatory Visit (INDEPENDENT_AMBULATORY_CARE_PROVIDER_SITE_OTHER): Payer: 59 | Admitting: Family Medicine

## 2015-10-20 ENCOUNTER — Encounter: Payer: Self-pay | Admitting: Family Medicine

## 2015-10-20 VITALS — BP 136/88 | HR 96 | Temp 98.2°F | Ht 67.0 in | Wt 320.0 lb

## 2015-10-20 DIAGNOSIS — I10 Essential (primary) hypertension: Secondary | ICD-10-CM

## 2015-10-20 DIAGNOSIS — E119 Type 2 diabetes mellitus without complications: Secondary | ICD-10-CM

## 2015-10-20 DIAGNOSIS — Z79899 Other long term (current) drug therapy: Secondary | ICD-10-CM | POA: Diagnosis not present

## 2015-10-20 LAB — POCT GLYCOSYLATED HEMOGLOBIN (HGB A1C): HEMOGLOBIN A1C: 10.4

## 2015-10-20 MED ORDER — GLIPIZIDE ER 10 MG PO TB24: 20.0000 mg | ORAL_TABLET | Freq: Every day | ORAL | Status: DC

## 2015-10-20 NOTE — Assessment & Plan Note (Signed)
Patient is here for a A1c check and to meet her new PCP. A1c today was 10.4 this is down from her previous but still elevated overall. We had a long discussion about techniques to reduce her overall portion sizes. Patient appears very motivated for diet changes and to keep her A1c at a more healthy level. - I've increased her glipizide from 10 mg daily to 20 mg daily. This may not have a significant effect on her overall A1c but I believe that patient's motivation would benefit greatly with even slight improvement in her A1c at our next visit. - I provided patient with information on a Mediterranean diet. We discussed that a drastic change in diet would likely be difficult to maintain however substituting one to 2 meals a week with a plan to substitute additional meals later on may be more beneficial and sustainable long-term. - Follow-up in 3 months.

## 2015-10-20 NOTE — Assessment & Plan Note (Signed)
Patient's initial blood pressure was slightly elevated. Repeat was within a much better range. This was likely secondary to initial white coat hypertension due to meeting her new PCP. - No changes to medications at this time.

## 2015-10-20 NOTE — Patient Instructions (Addendum)
It was a pleasure seeing you today in our clinic. Today we discussed your diet and diabetes. Here is the treatment plan we have discussed and agreed upon together:   - To have increased your glipizide from 10 mg to 20 mg. Begin this new dose by taking 1 tablet twice a day. After one week he may begin taking 2 tablets once a day if you would like. - Continue watching her carbohydrate intake. I believe that we are on the right track but the road as long and will not always be easy. - If you have any issues to this medication change do not hesitate to contact our office.     Why follow it? Research shows. . Those who follow the Mediterranean diet have a reduced risk of heart disease  . The diet is associated with a reduced incidence of Parkinson's and Alzheimer's diseases . People following the diet may have longer life expectancies and lower rates of chronic diseases  . The Dietary Guidelines for Americans recommends the Mediterranean diet as an eating plan to promote health and prevent disease  What Is the Mediterranean Diet?  . Healthy eating plan based on typical foods and recipes of Mediterranean-style cooking . The diet is primarily a plant based diet; these foods should make up a majority of meals   Starches - Plant based foods should make up a majority of meals - They are an important sources of vitamins, minerals, energy, antioxidants, and fiber - Choose whole grains, foods high in fiber and minimally processed items  - Typical grain sources include wheat, oats, barley, corn, brown rice, bulgar, farro, millet, polenta, couscous  - Various types of beans include chickpeas, lentils, fava beans, black beans, white beans   Fruits  Veggies - Large quantities of antioxidant rich fruits & veggies; 6 or more servings  - Vegetables can be eaten raw or lightly drizzled with oil and cooked  - Vegetables common to the traditional Mediterranean Diet include: artichokes, arugula, beets, broccoli,  brussel sprouts, cabbage, carrots, celery, collard greens, cucumbers, eggplant, kale, leeks, lemons, lettuce, mushrooms, okra, onions, peas, peppers, potatoes, pumpkin, radishes, rutabaga, shallots, spinach, sweet potatoes, turnips, zucchini - Fruits common to the Mediterranean Diet include: apples, apricots, avocados, cherries, clementines, dates, figs, grapefruits, grapes, melons, nectarines, oranges, peaches, pears, pomegranates, strawberries, tangerines  Fats - Replace butter and margarine with healthy oils, such as olive oil, canola oil, and tahini  - Limit nuts to no more than a handful a day  - Nuts include walnuts, almonds, pecans, pistachios, pine nuts  - Limit or avoid candied, honey roasted or heavily salted nuts - Olives are central to the [employer] - can be eaten whole or used in a variety of dishes   Meats Protein - Limiting red meat: no more than a few times a month - When eating red meat: choose lean cuts and keep the portion to the size of deck of cards - Eggs: approx. 0 to 4 times a week  - Fish and lean poultry: at least 2 a week  - Healthy protein sources include, chicken, [place], lean beef, lamb - Increase intake of seafood such as tuna, salmon, trout, mackerel, shrimp, scallops - Avoid or limit high fat processed meats such as sausage and bacon  Dairy - Include moderate amounts of low fat dairy products  - Focus on healthy dairy such as fat free yogurt, skim milk, low or reduced fat cheese - Limit dairy products higher in fat such as whole  or 2% milk, cheese, ice cream  Alcohol - Moderate amounts of red wine is ok  - No more than 5 oz daily for women (all ages) and men older than age 48  - No more than 10 oz of wine daily for men younger than 61  Other - Limit sweets and other desserts  - Use herbs and spices instead of salt to flavor foods  - Herbs and spices common to the traditional Mediterranean Diet include: basil, bay leaves, chives, cloves, cumin, fennel,  garlic, lavender, marjoram, mint, oregano, parsley, pepper, rosemary, sage, savory, sumac, tarragon, thyme   It's not just a diet, it's a lifestyle:  . The Mediterranean diet includes lifestyle factors typical of those in the region  . Foods, drinks and meals are best eaten with others and savored . Daily physical activity is important for overall good health . This could be strenuous exercise like running and aerobics . This could also be more leisurely activities such as walking, housework, yard-work, or taking the stairs . Moderation is the key; a balanced and healthy diet accommodates most foods and drinks . Consider portion sizes and frequency of consumption of certain foods   Meal Ideas & Options:  . Breakfast:  o Whole wheat toast or whole wheat English muffins with peanut butter & hard boiled egg o Steel cut oats topped with apples & cinnamon and skim milk  o Fresh fruit: banana, strawberries, melon, berries, peaches  o Smoothies: strawberries, bananas, greek yogurt, peanut butter o Low fat greek yogurt with blueberries and granola  o Egg white omelet with spinach and mushrooms o Breakfast couscous: whole wheat couscous, apricots, skim milk, cranberries  . Sandwiches:  o Hummus and grilled vegetables (peppers, zucchini, squash) on whole wheat bread   o Grilled chicken on whole wheat pita with lettuce, tomatoes, cucumbers or tzatziki  o Tuna salad on whole wheat bread: tuna salad made with greek yogurt, olives, red peppers, capers, green onions o Garlic rosemary lamb pita: lamb sauted with garlic, rosemary, salt & pepper; add lettuce, cucumber, greek yogurt to pita - flavor with lemon juice and black pepper  . Seafood:  o Mediterranean grilled salmon, seasoned with garlic, basil, parsley, lemon juice and black pepper o Shrimp, lemon, and spinach whole-grain pasta salad made with low fat greek yogurt  o Seared scallops with lemon orzo  o Seared tuna steaks seasoned salt, pepper,  coriander topped with tomato mixture of olives, tomatoes, olive oil, minced garlic, parsley, green onions and cappers  . Meats:  o Herbed greek chicken salad with kalamata olives, cucumber, feta  o Red bell peppers stuffed with spinach, bulgur, lean ground beef (or lentils) & topped with feta   o Kebabs: skewers of chicken, tomatoes, onions, zucchini, squash  o [place] burgers: made with red onions, mint, dill, lemon juice, feta cheese topped with roasted red peppers . Vegetarian o Cucumber salad: cucumbers, artichoke hearts, celery, red onion, feta cheese, tossed in olive oil & lemon juice  o Hummus and whole grain pita points with a greek salad (lettuce, tomato, feta, olives, cucumbers, red onion) o Lentil soup with celery, carrots made with vegetable broth, garlic, salt and pepper  o Tabouli salad: parsley, bulgur, mint, scallions, cucumbers, tomato, radishes, lemon juice, olive oil, salt and pepper.

## 2015-10-20 NOTE — Progress Notes (Signed)
   HPI  CC: Diabetes check Patient is here for a diabetes check and to meet her new primary care provider. Patient states she has been doing well and is 100% compliant with her medications. She is been attempting to watch what she eats specifically with carbohydrates. She is been seen a nutritionist through a program set up by her work. She continues to struggle with washing specific items in her diet. She denies any symptoms of hypoglycemia. She has no other issues at this time.  We also briefly discussed her antihypertensive medication regimen. Patient does not know what her blood pressure has been ranging in over the past few months.  ROS: She denies headaches, dizziness, lightheadedness, chest pain, shortness of breath, nausea, vomiting, diarrhea, abdominal pain, dysuria, weakness, numbness, paresthesias, urinary frequency, or polydipsia.  Past medical history and social history reviewed and updated in the EMR as appropriate.  Objective: BP 136/88 mmHg  Pulse 96  Temp(Src) 98.2 F (36.8 C) (Oral)  Ht 5\' 7"  (1.702 m)  Wt 320 lb (145.151 kg)  BMI 50.11 kg/m2  SpO2 98% Gen: NAD, alert, cooperative, and pleasant. CV: RRR, no murmur Resp: CTAB, no wheezes, non-labored Abd: SNTND, obese BS present, no guarding or organomegaly Ext: No edema, warm  Assessment and plan:  DM (diabetes mellitus) ([place]) Patient is here for a A1c check and to meet her new PCP. A1c today was 10.4 this is down from her previous but still elevated overall. We had a long discussion about techniques to reduce her overall portion sizes. Patient appears very motivated for diet changes and to keep her A1c at a more healthy level. - I've increased her glipizide from 10 mg daily to 20 mg daily. This may not have a significant effect on her overall A1c but I believe that patient's motivation would benefit greatly with even slight improvement in her A1c at our next visit. - I provided patient with information on a  Mediterranean diet. We discussed that a drastic change in diet would likely be difficult to maintain however substituting one to 2 meals a week with a plan to substitute additional meals later on may be more beneficial and sustainable long-term. - Follow-up in 3 months.  HYPERTENSION, BENIGN SYSTEMIC Patient's initial blood pressure was slightly elevated. Repeat was within a much better range. This was likely secondary to initial white coat hypertension due to meeting her new PCP. - No changes to medications at this time.    Orders Placed This Encounter  Procedures  . CBC  . BASIC METABOLIC PANEL WITH GFR  . POCT glycosylated hemoglobin (Hb A1C)    Meds ordered this encounter  Medications  . glipiZIDE (GLUCOTROL XL) 10 MG 24 hr tablet    Sig: Take 2 tablets (20 mg total) by mouth daily with breakfast.    Dispense:  180 tablet    Refill:  3     Elberta Leatherwood, MD,MS,  PGY2 10/20/2015 6:09 PM

## 2015-10-21 LAB — CBC
HEMATOCRIT: 38.6 % (ref 36.0–46.0)
Hemoglobin: 13 g/dL (ref 12.0–15.0)
MCH: 27.8 pg (ref 26.0–34.0)
MCHC: 33.7 g/dL (ref 30.0–36.0)
MCV: 82.5 fL (ref 78.0–100.0)
MPV: 10.6 fL (ref 8.6–12.4)
Platelets: 359 10*3/uL (ref 150–400)
RBC: 4.68 MIL/uL (ref 3.87–5.11)
RDW: 14.1 % (ref 11.5–15.5)
WBC: 10.4 10*3/uL (ref 4.0–10.5)

## 2015-10-21 LAB — BASIC METABOLIC PANEL WITHOUT GFR
BUN: 16 mg/dL (ref 7–25)
CO2: 25 mmol/L (ref 20–31)
Calcium: 11.1 mg/dL — ABNORMAL HIGH (ref 8.6–10.4)
Chloride: 99 mmol/L (ref 98–110)
Creat: 0.9 mg/dL (ref 0.50–1.05)
GFR, Est African American: 86 mL/min
GFR, Est Non African American: 75 mL/min
Glucose, Bld: 286 mg/dL — ABNORMAL HIGH (ref 65–99)
Potassium: 4.5 mmol/L (ref 3.5–5.3)
Sodium: 137 mmol/L (ref 135–146)

## 2015-10-25 ENCOUNTER — Encounter: Payer: Self-pay | Admitting: Family Medicine

## 2016-04-20 ENCOUNTER — Ambulatory Visit: Payer: 59 | Admitting: Family Medicine

## 2016-06-18 ENCOUNTER — Ambulatory Visit (INDEPENDENT_AMBULATORY_CARE_PROVIDER_SITE_OTHER): Payer: 59 | Admitting: Internal Medicine

## 2016-06-18 VITALS — BP 168/80 | HR 107 | Temp 98.6°F | Wt 319.0 lb

## 2016-06-18 DIAGNOSIS — E119 Type 2 diabetes mellitus without complications: Secondary | ICD-10-CM | POA: Diagnosis not present

## 2016-06-18 LAB — POCT GLYCOSYLATED HEMOGLOBIN (HGB A1C): HEMOGLOBIN A1C: 10.3

## 2016-06-18 MED ORDER — SITAGLIPTIN PHOSPHATE 50 MG PO TABS: 50.0000 mg | ORAL_TABLET | Freq: Every day | ORAL | 1 refills | Status: DC

## 2016-06-18 NOTE — Progress Notes (Deleted)
   [facility] Kerrin Mo, MD Phone: [phone]  Reason For Visit: fo  # *** -   Past Medical History Reviewed problem list.  Medications- reviewed and updated No additions to family history Social history- patient is a *** smoker  Objective: BP (!) 168/80 (BP Location: Left Arm, Patient Position: Sitting, Cuff Size: Large)   Pulse (!) 107   Temp 98.6 F (37 C) (Oral)   Wt (!) 319 lb (144.7 kg)   BMI 49.96 kg/m  Gen: NAD, alert, cooperative with exam HEENT: NCAT, EOMI, PERRL, TMs nml Neck: FROM, supple CV: RRR, good S1/S2, no murmur, cap refill <3 Resp: CTABL, no wheezes, non-labored Abd: SNTND, BS present, no guarding or organomegaly Ext: No edema, warm, normal tone, moves UE/LE spontaneously Neuro: Alert and oriented, No gross deficits Skin: no rashes no lesions  Assessment/Plan: See problem based a/p

## 2016-06-18 NOTE — Patient Instructions (Addendum)
I am going to start you on Januvia 50 mg, you can take this in the morning. I want to follow up with your PCP in 6 weeks for follow up. I think you are doing a great job with your weight loss program. Keep going!!

## 2016-06-18 NOTE — Progress Notes (Signed)
   [facility] Kerrin Mo, MD Phone: [phone]  Reason For Visit: Diabetes Follow up   # Diabetes - Patient was last seen in December 2016 by PCP for diabetes. Started on glipizide, with follow up in 3 months. However patient was unable to come to the doctor due to work commitments. Here today for diabetes follow up - Medications: metformin 1000 mg twice daily and Glipizide 10 mg x twice daily  - Seeing a nutritionist for weight loss at work, cutting out the sodas, eating more leafy green vegetables. Going to gym about 3-4 days out of the week and doing 30 minutes of exercise. Goal is for patient to loss 19 lbs.  - Blood sugar: Avg 200s fasting in the AM  - No hypoglycemia episodes. Denies ever having hypoglycemic episode. No palpations, diaphoresis, fatigue, weakness  - No polyuria or polydipsia   Past Medical History Reviewed problem list.  Medications- reviewed and updated No additions to family history  Social history- patient is a non- smoker  Objective: BP (!) 168/80 (BP Location: Left Arm, Patient Position: Sitting, Cuff Size: Large)   Pulse (!) 107   Temp 98.6 F (37 C) (Oral)   Wt (!) 319 lb (144.7 kg)   BMI 49.96 kg/m  Gen: NAD, alert, cooperative with exam HEENT: NCAT Neck: FROM,  Skin: no rashes no lesions  Assessment/Plan: See problem based a/p  DM (diabetes mellitus) ([facility]) Uncontrolled, hemoglobin A1c 10.3 today. Unchanged from previous A1c of 10.4 - Continue metformin and glipizide. -  Will add Januvia 50 mg once daily to current regiment. This is not treatment dose therefore will need to increase patient's dose to 100 mg to improve A1c by 1. - Patient continues to try to lose weight and has a plan in place. Working with a Engineer, maintenance (IT).  -Concerned that patient may need to start insulin in the future, therefore asked her to start taking her blood sugars once in the morning and once at night. And to bring this to her next appointment  with her PCP. -If no improvement over the next 6 weeks, consider referral to Dr. Valentina Lucks -Follow up with PCP in 6 weeks

## 2016-06-18 NOTE — Assessment & Plan Note (Signed)
Uncontrolled, hemoglobin A1c 10.3 today. Unchanged from previous A1c of 10.4 - Continue metformin and glipizide. -  Will add Januvia 50 mg once daily to current regiment. This is not treatment dose therefore will need to increase patient's dose to 100 mg to improve A1c by 1. - Patient continues to try to lose weight and has a plan in place. Working with a Engineer, maintenance (IT).  -Concerned that patient may need to start insulin in the future, therefore asked her to start taking her blood sugars once in the morning and once at night. And to bring this to her next appointment with her PCP. -If no improvement over the next 6 weeks, consider referral to Dr. Valentina Lucks -Follow up with PCP in 6 weeks

## 2016-08-06 ENCOUNTER — Other Ambulatory Visit: Payer: Self-pay | Admitting: *Deleted

## 2016-08-07 MED ORDER — GLUCOSE BLOOD VI STRP: 1.0000 | ORAL_STRIP | Freq: Every day | 12 refills | Status: DC

## 2016-08-07 MED ORDER — BAYER MICROLET LANCETS MISC: 12 refills | Status: DC

## 2016-08-16 ENCOUNTER — Other Ambulatory Visit: Payer: Self-pay | Admitting: Family Medicine

## 2016-08-16 DIAGNOSIS — E119 Type 2 diabetes mellitus without complications: Secondary | ICD-10-CM

## 2016-08-16 MED ORDER — SITAGLIPTIN PHOSPHATE 50 MG PO TABS: 50.0000 mg | ORAL_TABLET | Freq: Every day | ORAL | 3 refills | Status: DC

## 2016-08-16 NOTE — Telephone Encounter (Signed)
Pt would like a refill,  90 day supply of Januvia.Please advise. Thanks! ep

## 2016-08-16 NOTE — Telephone Encounter (Signed)
Insurance is requiring a 90 day supply of Januvia.  Derl Barrow, RN

## 2016-09-12 ENCOUNTER — Other Ambulatory Visit: Payer: Self-pay | Admitting: Family Medicine

## 2016-09-30 ENCOUNTER — Other Ambulatory Visit: Payer: Self-pay | Admitting: Family Medicine

## 2016-10-02 ENCOUNTER — Other Ambulatory Visit: Payer: Self-pay | Admitting: Family Medicine

## 2016-11-15 ENCOUNTER — Other Ambulatory Visit: Payer: Self-pay | Admitting: Family Medicine

## 2016-11-26 ENCOUNTER — Ambulatory Visit: Payer: 59 | Admitting: Internal Medicine

## 2016-12-05 ENCOUNTER — Other Ambulatory Visit: Payer: Self-pay | Admitting: *Deleted

## 2016-12-05 NOTE — Telephone Encounter (Signed)
Refill request for 90 day supply.  Fermon Ureta L, RN  

## 2016-12-06 MED ORDER — GLUCOSE BLOOD VI STRP: 1.0000 | ORAL_STRIP | Freq: Every day | 3 refills | Status: DC

## 2016-12-14 ENCOUNTER — Telehealth: Payer: Self-pay | Admitting: Family Medicine

## 2016-12-14 NOTE — Telephone Encounter (Signed)
Patient is aware that she needs to come in for HM. She is in the process of requesting time off work in order to make an appointment. She will call back when all is confirmed.

## 2016-12-18 ENCOUNTER — Telehealth: Payer: Self-pay | Admitting: Family Medicine

## 2016-12-18 NOTE — Telephone Encounter (Signed)
Her junuvia is over $400. She cannot afford that. Pharmacist suggested dr might have a coupon.  [facility] coupon to [facility] on [address].  Please advise

## 2016-12-18 NOTE — Telephone Encounter (Signed)
I don't know what coupon we're talking about. Also, Patient hasn't been seen be me, her PCP, in over a year. Please schedule an appt.

## 2016-12-24 ENCOUNTER — Encounter: Payer: Self-pay | Admitting: Student

## 2016-12-24 ENCOUNTER — Ambulatory Visit (INDEPENDENT_AMBULATORY_CARE_PROVIDER_SITE_OTHER): Payer: 59 | Admitting: Student

## 2016-12-24 VITALS — BP 148/80 | HR 101 | Temp 97.9°F | Ht 67.0 in | Wt 323.8 lb

## 2016-12-24 DIAGNOSIS — E785 Hyperlipidemia, unspecified: Secondary | ICD-10-CM | POA: Diagnosis not present

## 2016-12-24 DIAGNOSIS — I10 Essential (primary) hypertension: Secondary | ICD-10-CM | POA: Diagnosis not present

## 2016-12-24 DIAGNOSIS — E119 Type 2 diabetes mellitus without complications: Secondary | ICD-10-CM | POA: Diagnosis not present

## 2016-12-24 LAB — BASIC METABOLIC PANEL WITH GFR
BUN: 16 mg/dL (ref 7–25)
CHLORIDE: 103 mmol/L (ref 98–110)
CO2: 23 mmol/L (ref 20–31)
Calcium: 10.2 mg/dL (ref 8.6–10.4)
Creat: 1.03 mg/dL (ref 0.50–1.05)
GFR, EST NON AFRICAN AMERICAN: 63 mL/min (ref >=60)
GFR, Est African American: 73 mL/min (ref >=60)
GLUCOSE: 337 mg/dL — AB (ref 65–99)
POTASSIUM: 4.4 mmol/L (ref 3.5–5.3)
Sodium: 138 mmol/L (ref 135–146)

## 2016-12-24 LAB — LIPID PANEL
CHOLESTEROL: 229 mg/dL — AB (ref <200)
HDL: 57 mg/dL (ref >50)
LDL CALC: 120 mg/dL — AB (ref <100)
TRIGLYCERIDES: 260 mg/dL — AB (ref <150)
Total CHOL/HDL Ratio: 4 Ratio (ref <5.0)
VLDL: 52 mg/dL — ABNORMAL HIGH (ref <30)

## 2016-12-24 LAB — POCT GLYCOSYLATED HEMOGLOBIN (HGB A1C): HEMOGLOBIN A1C: 10

## 2016-12-24 MED ORDER — LISINOPRIL 5 MG PO TABS: 5.0000 mg | ORAL_TABLET | Freq: Every day | ORAL | 3 refills | Status: DC

## 2016-12-24 MED ORDER — DESLORATADINE 5 MG PO TABS: ORAL_TABLET | ORAL | 5 refills | Status: DC

## 2016-12-24 MED ORDER — GLUCOSE BLOOD VI STRP: 1.0000 | ORAL_STRIP | Freq: Every day | 3 refills | Status: DC

## 2016-12-24 NOTE — Progress Notes (Signed)
dia

## 2016-12-24 NOTE — Assessment & Plan Note (Addendum)
Poorly controlled. A1c 10.0. It was 10.3 previously. Now can't afford Januvia until she meets her deductible. Unfortunately, there are no other cheap oral medications out there to add to her regimen. She also don't want to starts insulin yet saying she would like to push with life style changes. She joined [employer] She says she is determined this time. She also sees dietitian at place of work. Foot exam within normal limit. -Refilled her test strips -Urine for microalbuminuria -BMP and Lipid panel -Will start statin and ASA after lipid panel. -Will give pneumonia vaccine when she meets her deductible  -Follow up in a week for BMP, then in three months.

## 2016-12-24 NOTE — Patient Instructions (Addendum)
It was great seeing you today! We have addressed the following issues today  1. Diabetes: Your A1c is 10.0 today. It was 10.3 previously. Hemoglobin A1c is less than 7.0. Up with the lifestyle change including diet and exercise. Unfortunately we don't have samples for oral diabetic medications. I also don't think there are other options cheaper except glipizide and metformin. Continue taking those two for now.  2.   Have sent a prescription for lisinopril to protect the kidney from diabetes.   If we did any lab work today, and the results require attention, either me or my nurse will get in touch with you. If everything is normal, you will get a letter in mail. If you don't hear from [place] in two weeks, please give [place] a call. Otherwise, we look forward to seeing you again at your next visit. If you have any questions or concerns before then, please call the clinic at [phone].   Please bring all your medications to every doctors visit   Sign up for My Chart to have easy access to your labs results, and communication with your Primary care physician.     Please check-out at the front desk before leaving the clinic.    Portion Size    Choose healthier foods such as 100% whole grains, vegetables, fruits, beans, nut seeds, olive oil, most vegetable oils, fat-free dietary, wild game and fish.   Avoid sweet tea, other sweetened beverages, soda, fruit juice, cold cereal and milk and trans fat.   Eat at least 3 meals and 1-2 snacks per day.  Aim for no more than 5 hours between eating.  Eat breakfast within one hour of getting up.    Exercise at least 150 minutes per week, including weight resistance exercises 3 or 4 times per week.   Try to lose at least 7-10% of your current body weight.  Exercise Guide:

## 2016-12-24 NOTE — Assessment & Plan Note (Addendum)
BP slightly elevated but not repeated. -BMP today

## 2016-12-24 NOTE — Progress Notes (Signed)
Subjective:    Erin Schultz is a 51 y.o. old female here for follow up on her diabetes  HPI Diabetes: poorly controlled. Checks her blood sugar in the morning. Fasting blood sugar ranges from 160-360's. She is not on inulin. Recently not taking Januvia due to high copay until she meets her deductible. She is asking if we have samples for Januvia. She was started on Januvia about three moths ago. She is taking metformin 1 gm twice a day and glipizide XL 10 mg twice a day. She reports good compliance with those two mediations. She doesn't want to start insulin yet as she started life style changes. She says she started dieting, stopped soda, cut down on carbs focusing on vegetables for the last three weeks. She also started walking on treadmill but her bone spurs is limiting her exercise.  She denies symptoms of hyper or hypoglycemia. She saw eye doctor at [place] [facility] recently as was told her eye exam was normal. She has been to her dentist recently. She is not on statin or ASA.   PMH/Problem List: has DM (diabetes mellitus) ([place]); HYPERLIPIDEMIA; OBESITY, NOS; HYPERTENSION, BENIGN SYSTEMIC; RHINITIS, ALLERGIC; DERMATITIS, ATOPIC; Hernia, umbilical, with gangrene; Protein-calorie malnutrition, moderate ([place]); Open abdominal wall wound with VAC; and Recurrent ventral incisional hernia on her problem list.   has a past medical history of Acute peritonitis ([place]) (12/20/2012); Acute respiratory failure with hypoxia ([place]) (12/20/2012); Chronic kidney disease; Diabetes mellitus without complication ([place]); H/O hiatal hernia; Hypertension; Septic shock(785.52) (12/20/2012); Severe sepsis(995.92) (12/20/2012); and Small bowel obstruction (12/20/2012).  FH:  Family History  Problem Relation Age of Onset  . Diabetes Father   . Diabetes Brother   . Hypertension Father     [place] Social History  Substance Use Topics  . Smoking status: Never Smoker  . Smokeless tobacco: Never Used  . Alcohol use No     Review of Systems Review of systems negative except for pertinent positives and negatives in history of present illness above.     Objective:     Vitals:   12/24/16 1551  BP: (!) 148/80  Pulse: (!) 101  Temp: 97.9 F (36.6 C)  TempSrc: Oral  SpO2: 99%  Weight: (!) 323 lb 12.8 oz (146.9 kg)  Height: 5\' 7"  (1.702 m)    Physical Exam GEN: morbidly obese, no apparent distress. Oropharynx: mmm without erythema or exudation HEM: negative for cervical or periauricular lymphadenopathies CVS: RRR, nl S1&S2, no murmurs, no edema,  2+ DP & PT pulses bilaterally, cap refills < 2 secs RESP: no IWOB, CTAB GI: obese, BS present & normal, soft, NTND MSK: no focal tenderness or notable swelling.  SKIN: no apparent skin lesion in her feet NEURO: alert and oiented appropriately, no gross deficits. A nine point monofilament exam within normal limit PSYCH: euthymic mood with congruent affect    Assessment and Plan:  DM (diabetes mellitus) ([place]) Poorly controlled. A1c 10.0. It was 10.3 previously. Now can't afford Januvia until she meets her deductible. Unfortunately, there are no other cheap oral medications out there to add to her regimen. She also don't want to starts insulin yet saying she would like to push with life style changes. She joined [employer] She says she is determined this time. She also sees dietitian at place of work. Foot exam within normal limit. -Refilled her test strips -Urine for microalbuminuria -BMP and Lipid panel -Will start statin and ASA after lipid panel. -Will give pneumonia vaccine when she meets her deductible  -  Follow up in a week for BMP, then in three months.    HYPERTENSION, BENIGN SYSTEMIC BP slightly elevated but not repeated. -BMP today  Orders Placed This Encounter  Procedures  . Microalbumin/Creatinine Ratio, Urine  . Lipid panel  . BASIC METABOLIC PANEL WITH GFR  . BASIC METABOLIC PANEL WITH GFR    Standing Status:   Future     Standing Expiration Date:   12/24/2017  . HgB A1c    Return in about 3 months (around 03/23/2017) for Diabetes.  Mercy Riding, MD 12/25/16 Pager: [phone]

## 2016-12-25 ENCOUNTER — Encounter: Payer: Self-pay | Admitting: Student

## 2016-12-25 ENCOUNTER — Telehealth: Payer: Self-pay | Admitting: Student

## 2016-12-25 DIAGNOSIS — E785 Hyperlipidemia, unspecified: Secondary | ICD-10-CM

## 2016-12-25 LAB — MICROALBUMIN / CREATININE URINE RATIO
CREATININE, URINE: 89 mg/dL (ref 20–320)
Microalb Creat Ratio: 37 mcg/mg creat — ABNORMAL HIGH (ref <30)
Microalb, Ur: 3.3 mg/dL

## 2016-12-25 MED ORDER — ASPIRIN EC 81 MG PO TBEC: 81.0000 mg | DELAYED_RELEASE_TABLET | Freq: Every day | ORAL | 0 refills | Status: DC

## 2016-12-25 MED ORDER — ATORVASTATIN CALCIUM 40 MG PO TABS: 40.0000 mg | ORAL_TABLET | Freq: Every day | ORAL | 3 refills | Status: DC

## 2016-12-25 NOTE — Telephone Encounter (Signed)
This was addressed at office visit yesterday.

## 2016-12-25 NOTE — Telephone Encounter (Signed)
Called patient to discuss about her results from her recent visit with me. Patient has ASCVD risk score of 16.1%. Recommended starting atorvastatin 40 mg daily and baby aspirin. Patient is in agreement with this.  We also discussed about diabetic medications in addition to glipizide and metformin. She was not able to afford Januvia until she meets a deductible. She said her deductible was about $450. We talked about Actos which costs about $150 for 30 day supply. However, she is still worried about affording it as she has to start atorvastatin. She is also concerned after I discuss about the risks of hypoglycemia especially when taken with glipizide and fluid retension. Patient choose to stick with the lifestyle change for now. She will follow-up with PCP to discuss progress and options in a month.

## 2017-01-03 ENCOUNTER — Other Ambulatory Visit: Payer: Self-pay | Admitting: Family Medicine

## 2017-01-03 ENCOUNTER — Telehealth: Payer: Self-pay | Admitting: Family Medicine

## 2017-01-03 MED ORDER — ONETOUCH DELICA LANCETS FINE MISC: 1.0000 "application " | Freq: Three times a day (TID) | 12 refills | Status: AC

## 2017-01-03 MED ORDER — GLUCOSE BLOOD VI STRP: ORAL_STRIP | 12 refills | Status: DC

## 2017-01-03 NOTE — Telephone Encounter (Signed)
Rx sent 

## 2017-01-03 NOTE — Telephone Encounter (Signed)
Now has one touch ultra 2 glucose monitor. Needs RX for lancets and strips called in .  [facility] [address]

## 2017-05-23 ENCOUNTER — Encounter: Payer: Self-pay | Admitting: Family Medicine

## 2017-05-23 NOTE — Progress Notes (Signed)
   Subjective:   Patient ID: Erin Schultz    DOB: 1966/01/16, 51 y.o. female   MRN: [id number]  CC: "Diabetes"  HPI: [name] is a 51 y.o. female who presents to clinic today diabetes. Problems discussed today are as follows:  Diabetes: Patient only checks fasting sugar levels ranging in the mid to high 200s. She does not recall numbers greater than 200s and denies lows. She is currently taking her glipizide, metformin, and Januvia. Patient has made efforts to see her nutritionist through her work and cut down on carbs. She is also made attempts to walk at a local gym since being seen in February. ROS: Denies loss of feeling in her extremities, loss of motor function, nausea or vomiting, abdominal pain.  Hypertension: Patient has been taking medications as instructed and rarely misses doses. She has been making efforts to lose weight at the gym. She denies history of smoking.  Hyperlipidemia: Patient recently started on aspirin in high intensity statin during last visit on 12/2016. Has been compliant with aspirin but occasionally misses doses of Lipitor.  Complete ROS performed, see HPI for pertinent.  [place]: Poorly controlled NIDDM with HLD, morbid obesity, HTN, recurrent ventral hernia. SHx lap chole, appendectomy, TAH, vacuum assisted ventral hernia closure x3. Father DM, HTN, brother DM. Smoking status reviewed. Medications reviewed.  Objective:   BP 130/82   Pulse 88   Temp 98.7 F (37.1 C) (Oral)   Ht 5\' 7"  (1.702 m)   Wt (!) 318 lb (144.2 kg)   SpO2 97%   BMI 49.81 kg/m  Vitals and nursing note reviewed.  General: morbidly obese, well nourished, well developed, in no acute distress with non-toxic appearance HEENT: normocephalic, atraumatic, moist mucous membranes CV: regular rate and rhythm without murmurs, rubs, or gallops, no lower extremity edema Lungs: clear to auscultation bilaterally with normal work of breathing Abdomen: soft, non-tender, non-distended,  normoactive bowel sounds Skin: warm, dry, no rashes or lesions, cap refill < 2 seconds Extremities: warm and well perfused, normal tone  Assessment & Plan:   Primary hypertension Chronic. Controlled. On quadruple therapy with Maxide and Lotrel. Nonsmoker. --Continue Maxide 37.5-25 mg daily and Lotrel 5-40 mg daily  Mixed hyperlipidemia due to type 2 diabetes mellitus (HCC) Chronic. Likely due to your diabetes. Mixed hyperlipidemia by previous lipid panel. Currently on high intensity statin but occasionally misses dose. --Encouraged patient to continue taking Lipitor 40 mg daily daily  Poorly controlled diabetes mellitus (HCC) Chronic. Uncontrolled. A1c 11.1 in office today. On triple therapy for diabetes without history of insulin use. Begin lifestyle modifications but clearly needs insulin at this point. --Instructed to continue metformin 1000 g daily and Januvia 50 mg daily --Will discontinue glipizide 10 mg daily due to plan to initiate insulin and minimal effects at this time --Patient to see Dr. Valentina Lucks on 7/19 at 8:30 AM to initiate insulin  Orders Placed This Encounter  Procedures  . POCT glycosylated hemoglobin (Hb A1C)   No orders of the defined types were placed in this encounter.   Harriet Butte, [place], PGY-2 05/24/2017 11:31 AM

## 2017-05-24 ENCOUNTER — Ambulatory Visit (INDEPENDENT_AMBULATORY_CARE_PROVIDER_SITE_OTHER): Payer: 59 | Admitting: Family Medicine

## 2017-05-24 ENCOUNTER — Encounter: Payer: Self-pay | Admitting: Family Medicine

## 2017-05-24 VITALS — BP 130/82 | HR 88 | Temp 98.7°F | Ht 67.0 in | Wt 318.0 lb

## 2017-05-24 DIAGNOSIS — E782 Mixed hyperlipidemia: Secondary | ICD-10-CM | POA: Diagnosis not present

## 2017-05-24 DIAGNOSIS — Z23 Encounter for immunization: Secondary | ICD-10-CM

## 2017-05-24 DIAGNOSIS — E1165 Type 2 diabetes mellitus with hyperglycemia: Secondary | ICD-10-CM

## 2017-05-24 DIAGNOSIS — I10 Essential (primary) hypertension: Secondary | ICD-10-CM | POA: Diagnosis not present

## 2017-05-24 DIAGNOSIS — E1169 Type 2 diabetes mellitus with other specified complication: Secondary | ICD-10-CM

## 2017-05-24 LAB — POCT GLYCOSYLATED HEMOGLOBIN (HGB A1C): Hemoglobin A1C: 11.5

## 2017-05-24 NOTE — Assessment & Plan Note (Addendum)
Chronic. Controlled. On quadruple therapy with Maxide and Lotrel. Nonsmoker. --Continue Maxide 37.5-25 mg daily and Lotrel 5-40 mg daily

## 2017-05-24 NOTE — Assessment & Plan Note (Signed)
Chronic. Likely due to your diabetes. Mixed hyperlipidemia by previous lipid panel. Currently on high intensity statin but occasionally misses dose. --Encouraged patient to continue taking Lipitor 40 mg daily daily

## 2017-05-24 NOTE — Assessment & Plan Note (Addendum)
Chronic. Uncontrolled. A1c 11.1 in office today. On triple therapy for diabetes without history of insulin use. Begin lifestyle modifications but clearly needs insulin at this point. --Instructed to continue metformin 1000 g daily and Januvia 50 mg daily --Will discontinue glipizide 10 mg daily due to plan to initiate insulin and minimal effects at this time --Patient to see Dr. Valentina Lucks on 7/19 at 8:30 AM to initiate insulin --Pneumovax given today in office

## 2017-05-24 NOTE — Patient Instructions (Signed)
Thank you for coming in to see [place] today. Please see below to review our plan for today's visit.  1. I will have he stopped her glipizide today. You can continue her metformin and Januvia at this time. We will start you on insulin but wait until you follow up with Dr. Valentina Lucks on 7/19 at 8:30 AM. He will discuss the benefits of insulin answer any questions you have about this medication. You're doing great with lifestyle modifications. Keep up the good work. Just because she start insulin does not mean you have to stay on it. 2. Continue taking your Lipitor as instructed. This medication will help protect your heart in the long-term. 3. You need to bring your medications include glucometer during her next visit and subsequent visits. Check these first thing in the morning before breakfast and aproximally 1-2 hours after lunch and dinner.  Return to clinic in one month to discuss diabetes.  Please call the clinic at [phone] if your symptoms worsen or you have any concerns. It was my pleasure to see you. -- Harriet Butte, [place], PGY-2

## 2017-05-30 ENCOUNTER — Ambulatory Visit (INDEPENDENT_AMBULATORY_CARE_PROVIDER_SITE_OTHER): Payer: 59 | Admitting: Pharmacist

## 2017-05-30 ENCOUNTER — Encounter: Payer: Self-pay | Admitting: Pharmacist

## 2017-05-30 DIAGNOSIS — E1165 Type 2 diabetes mellitus with hyperglycemia: Secondary | ICD-10-CM | POA: Diagnosis not present

## 2017-05-30 MED ORDER — INSULIN PEN NEEDLE 31G X 6 MM MISC: 1.0000 | 0 refills | Status: DC | PRN

## 2017-05-30 MED ORDER — LIRAGLUTIDE 18 MG/3ML ~~LOC~~ SOPN: 0.6000 mg | PEN_INJECTOR | Freq: Every day | SUBCUTANEOUS | 1 refills | Status: DC

## 2017-05-30 NOTE — Progress Notes (Signed)
Patient ID: Erin Schultz, female   DOB: 07/14/1966, 51 y.o.   MRN: [id number] Reviewed: Agree with Dr. Koval's documentation and management. 

## 2017-05-30 NOTE — Progress Notes (Signed)
    S:    Chief Complaint  Patient presents with  . Medication Management    diabetes, insulin initiation   Patient arrives ambulating without assistance with a positive attitude about staring injection therapy.  Presents for diabetes evaluation, education, and management at the request of Dr. Yisroel Ramming.  She anticipated starting insulin at this visit.   She expressed confidence in her ability to do self-injection.  Patient was referred on 05/24/2017.  Patient was last seen by Primary Care Provider on 05/24/2017.   Patient endorses that she is making lifestyle changes with diet and exercise.  Her goal weight is to be 300 lbs over the next 6 months.   Patient reports Diabetes was diagnosed approximately 5 years ago.    Patient reports adherence with medications.  Current diabetes medications include: metformin, januvia and has recently stopped glipizide.  Current hypertension medications include: amlodipine/benazepril, hctz-triamp  Patient denies hypoglycemic events.  Patient reported dietary habits: Eats 3 meals/day Reports she has cut down on potatoes.  She avoids breads, soda, and sweets.   Drinks: mostly water and green tea.  Has been using protein shakes 1-2 times per day  Patient reported exercise habits have increased, walking 2 miles per day and going to the "[employer]" 2-3 days per week for 30 minutes.    Reports all CBG > 200 fasting.     O:  Physical Exam  Constitutional: She appears well-developed and well-nourished.  Vitals reviewed.  Review of Systems  Constitutional: Negative.    Lab Results  Component Value Date   HGBA1C 11.5 05/24/2017   Lipid Panel     Component Value Date/Time   CHOL 229 (H) 12/24/2016 1648   TRIG 260 (H) 12/24/2016 1648   HDL 57 12/24/2016 1648   CHOLHDL 4.0 12/24/2016 1648   VLDL 52 (H) 12/24/2016 1648   LDLCALC 120 (H) 12/24/2016 1648   LDLDIRECT 150 (H) 05/10/2014 1128   Vitals:   05/30/17 0839  BP: (!) 152/84  Pulse:  92    A/P: Diabetes longstanding diagnosed currently uncontrolled. Patient denies hypoglycemic events and is able to verbalize appropriate hypoglycemia management plan. Patient reports adherence with medication. Control is suboptimal due to obesity, and insulin resistance. -Started Victoza (liraglutide) 0.6 mg daily.  Will titrate to 1.2 mg daily after 1 week and 1.8 mg daily after 2 weeks. Discussed proper administration, efficacy and potential side effects including nausea.  -Stop Januvia -Continue Metformin 1000 mg BID.   ASCVD risk greater than 7.5%. Continued Aspirin 81 mg and Continued atorvastatin 40 mg daily   Hypertension longstanding diagnosed currently above goal.  Patient reports adherence with medication. Continue to monitor at next visit.  BP may have been elevated today due to anxiety about starting an injectable diabetes medication.   Written patient instructions provided.  Total time in face to face counseling 30 minutes.   Follow up in Pharmacist Clinic Visit in 3 weeks.   Patient seen with Bennye Alm, PharmD, BCPS.

## 2017-05-30 NOTE — Patient Instructions (Signed)
Great to meet you today.   Please stop Januvia.   Start Victoza (Liraglutide) at the 0.6mg  dose daily In 1 week increase the dose to 1.2mg  daily In 2 weeks increase the dose to 1.8mg .  IF you experience moderate nausea, please reduce the dose to the previous well tolerated dose dose and then begin increasing the dose by 1 click per day.   This slow increase should improve the tolerability.   Follow up in pharmacy clinic in 3 weeks.  Please bring your meter or log book to that visit.

## 2017-05-30 NOTE — Assessment & Plan Note (Signed)
Diabetes longstanding diagnosed currently uncontrolled. Patient denies hypoglycemic events and is able to verbalize appropriate hypoglycemia management plan. Patient reports adherence with medication. Control is suboptimal due to obesity, and insulin resistance. -Started Victoza (liraglutide) 0.6 mg daily.  Will titrate to 1.2 mg daily after 1 week and 1.8 mg daily after 2 weeks. Discussed proper administration, efficacy and potential side effects including nausea.  -Stop Januvia -Continue Metformin 1000 mg BID.

## 2017-06-20 ENCOUNTER — Ambulatory Visit: Payer: 59 | Admitting: Pharmacist

## 2017-06-24 ENCOUNTER — Ambulatory Visit: Payer: 59 | Admitting: Family Medicine

## 2017-07-18 ENCOUNTER — Other Ambulatory Visit: Payer: Self-pay | Admitting: Family Medicine

## 2017-07-18 ENCOUNTER — Encounter: Payer: Self-pay | Admitting: Pharmacist

## 2017-07-18 ENCOUNTER — Ambulatory Visit (INDEPENDENT_AMBULATORY_CARE_PROVIDER_SITE_OTHER): Payer: 59 | Admitting: Pharmacist

## 2017-07-18 DIAGNOSIS — I10 Essential (primary) hypertension: Secondary | ICD-10-CM

## 2017-07-18 DIAGNOSIS — E1165 Type 2 diabetes mellitus with hyperglycemia: Secondary | ICD-10-CM

## 2017-07-18 MED ORDER — EMPAGLIFLOZIN 10 MG PO TABS: 10.0000 mg | ORAL_TABLET | Freq: Every day | ORAL | 2 refills | Status: DC

## 2017-07-18 MED ORDER — LIRAGLUTIDE 18 MG/3ML ~~LOC~~ SOPN: 1.8000 mg | PEN_INJECTOR | Freq: Every day | SUBCUTANEOUS | 11 refills | Status: DC

## 2017-07-18 NOTE — Progress Notes (Signed)
    S:     Chief Complaint  Patient presents with  . Medication Management    diabetes    Patient arrives in good spirits ambulating without assistance.  Presents for diabetes evaluation, education, and management at the request of Dr. Yisroel Ramming.  Patient was referred on 05/24/2017.  Patient was last seen by Primary Care Provider on 05/24/2017. Last seen in pharmacy clinic on 05/30/17. At that time, victoza was started.   Noticing more diarrhea since victoza start but this is not bothersome, otherwise tolerating well. Has noticied CBGs have improved and she is eating less.   Patient reports adherence with medications.  Current diabetes medications include: Victoza 1.8 mg daily, metformin 1000 mg BID Current hypertension medications include: amlodipine-benazepril 5-40 mg daily, triamterene-HCTZ 37.5-25 daily  Patient denies hypoglycemic events.  Patient reported dietary habits: Eats 3 meals/day.  Breakfast: yogurt, granola bars  Lunch: salad w/ vinaigrette dressing Dinner: beans, baked chicken  Snacks: chex mix, apples, bananas  Drinks: water with crystal light, diet green tea  Patient reported exercise habits: walking ~1-2 miles a day, parking further from her work entrance and walking on breaks, [employer] 2-3 times a week, used to use treadmill, using stationary bike now 2/2 suspected bone spur  Patient reports nocturia. ~3 times a night Patient denies neuropathy.  Patient denies visual changes. Patient reports self foot exams.    O:  Physical Exam  Constitutional: She appears well-developed and well-nourished.  Vitals reviewed.    Review of Systems  All other systems reviewed and are negative.    Lab Results  Component Value Date   HGBA1C 11.5 05/24/2017   Vitals:   07/18/17 1011  BP: (!) 147/84  Pulse: 99    Home fasting CBG: 214-233, avg ~250 2 hour post-prandial/random CBG: 300s.  A/P: Diabetes longstanding currently uncontrolled as evidenced by A1C  and CBGs, however improved with lifestyle modifications and new medication. Patient denies hypoglycemic events and is able to verbalize appropriate hypoglycemia management plan. Patient reports adherence with medication. Control is suboptimal due to insulin resistance, dietary indiscretion. -Continue Victoza 1.8 mg injected daily -Start Jardiance 10 mg daily. Patient educated on purpose, proper use and potential adverse effects of Jardiance.  Following instruction patient verbalized understanding of treatment plan.  -Continue metformin 1000 mg BID  Next A1C anticipated 08/24/2017.    ASCVD risk greater than 7.5%. Continued Aspirin 81 mg and Continued atorvastatin 40 mg.   Hypertension longstanding currently uncontrolled.  Patient reports adherence with medication. Control is suboptimal due to body habitus, dietary indiscretion, unoptimized medication regimen. -Jardiance start will likely provide some blood pressure lowering effects -Continue current regimen at this time -Will re-evaluate at next visit  Written patient instructions provided.  Total time in face to face counseling 30 minutes.   Follow up in Pharmacist Clinic Visit 1 month.   Patient seen with Cleotis Lema, PharmD Candidate, and Deirdre Pippins, PharmD PGY-2 Resident.

## 2017-07-18 NOTE — Assessment & Plan Note (Addendum)
Diabetes longstanding currently uncontrolled as evidenced by A1C and CBGs, however improved with lifestyle modifications and new medication. Patient denies hypoglycemic events and is able to verbalize appropriate hypoglycemia management plan. Patient reports adherence with medication. Control is suboptimal due to insulin resistance, dietary indiscretion. -Continue Victoza 1.8 mg injected daily -Start Jardiance 10 mg daily. Patient educated on purpose, proper use and potential adverse effects of Jardiance.  Following instruction patient verbalized understanding of treatment plan.  -Continue metformin 1000 mg BID  Next A1C anticipated 08/24/2017.

## 2017-07-18 NOTE — Progress Notes (Signed)
Patient ID: Erin Schultz, female   DOB: 1966-04-30, 51 y.o.   MRN: [id number] Reviewed: Agree with Dr. Graylin Shiver documentation and management.

## 2017-07-18 NOTE — Patient Instructions (Addendum)
Thanks for coming to see [place] today!  1. Continue taking your Victoza, 1.8 mg injected daily  2. Start taking Jardiance 10 mg daily. This will help you pee out any extra sugar in your blood. You may see an increased frequency of UTIs or yeast infections when starting this medication. Make sure to stop taking this medication if you are severely sick and dehydrated.  3. 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  See [place] at [facility] in ~4 weeks

## 2017-07-18 NOTE — Assessment & Plan Note (Signed)
Hypertension longstanding currently uncontrolled.  Patient reports adherence with medication. Control is suboptimal due to body habitus, dietary indiscretion, unoptimized medication regimen. -Jardiance start will likely provide some blood pressure lowering effects -Continue current regimen at this time -Will re-evaluate at next visit

## 2017-08-24 ENCOUNTER — Other Ambulatory Visit: Payer: Self-pay | Admitting: Family Medicine

## 2017-08-24 DIAGNOSIS — E1165 Type 2 diabetes mellitus with hyperglycemia: Secondary | ICD-10-CM

## 2017-10-15 ENCOUNTER — Other Ambulatory Visit: Payer: Self-pay | Admitting: Family Medicine

## 2017-10-15 DIAGNOSIS — E1165 Type 2 diabetes mellitus with hyperglycemia: Secondary | ICD-10-CM

## 2017-10-15 DIAGNOSIS — I10 Essential (primary) hypertension: Secondary | ICD-10-CM

## 2017-10-15 MED ORDER — AMLODIPINE BESY-BENAZEPRIL HCL 5-40 MG PO CAPS: 1.0000 | ORAL_CAPSULE | Freq: Every day | ORAL | 3 refills | Status: DC

## 2017-10-15 MED ORDER — METFORMIN HCL 1000 MG PO TABS: 1000.0000 mg | ORAL_TABLET | Freq: Two times a day (BID) | ORAL | 3 refills | Status: DC

## 2017-10-16 ENCOUNTER — Telehealth: Payer: Self-pay | Admitting: Family Medicine

## 2017-10-16 ENCOUNTER — Other Ambulatory Visit: Payer: Self-pay | Admitting: Family Medicine

## 2017-10-16 DIAGNOSIS — E1165 Type 2 diabetes mellitus with hyperglycemia: Secondary | ICD-10-CM

## 2017-10-16 MED ORDER — SEMAGLUTIDE (1 MG/DOSE) 2 MG/1.5ML ~~LOC~~ SOPN: 1.0000 mg | PEN_INJECTOR | SUBCUTANEOUS | 11 refills | Status: DC

## 2017-10-16 NOTE — Telephone Encounter (Signed)
Pt insurance has changed and she can no longer afford her Victoza. She is wondering if there is something else that could be prescribed in its place. She has about 5 injrections lefts. Please advise

## 2017-10-16 NOTE — Telephone Encounter (Signed)
Contacted patient regarding change of insurance and need for alternative to Victoza.  Advised patient to contact insurance company to determine formulary GLP-1 agonist.  Patient currently on high dose of 1.8 mg once weekly Victoza.  Proactively prescribed Ozempic 1 mg once weekly.  Patient to contact insurance without further questions or concerns.  Harriet Butte, [place], PGY-2

## 2018-09-01 ENCOUNTER — Other Ambulatory Visit: Payer: Self-pay | Admitting: Family Medicine

## 2018-09-01 NOTE — Telephone Encounter (Signed)
Patient not seen by me since July of 2018. Needs appointment. Will fill 1 month supply. Please advise.  Harriet Butte, [place], PGY-3

## 2018-10-04 ENCOUNTER — Other Ambulatory Visit: Payer: Self-pay | Admitting: Family Medicine

## 2018-10-07 ENCOUNTER — Other Ambulatory Visit: Payer: Self-pay

## 2018-10-07 ENCOUNTER — Ambulatory Visit: Payer: BC Managed Care – PPO | Admitting: Family Medicine

## 2018-10-07 ENCOUNTER — Encounter: Payer: Self-pay | Admitting: Family Medicine

## 2018-10-07 VITALS — BP 154/80 | HR 111 | Temp 98.6°F | Ht 67.0 in | Wt 268.0 lb

## 2018-10-07 DIAGNOSIS — I10 Essential (primary) hypertension: Secondary | ICD-10-CM | POA: Diagnosis not present

## 2018-10-07 DIAGNOSIS — E1165 Type 2 diabetes mellitus with hyperglycemia: Secondary | ICD-10-CM | POA: Diagnosis not present

## 2018-10-07 DIAGNOSIS — B373 Candidiasis of vulva and vagina: Secondary | ICD-10-CM | POA: Insufficient documentation

## 2018-10-07 DIAGNOSIS — B3731 Acute candidiasis of vulva and vagina: Secondary | ICD-10-CM | POA: Insufficient documentation

## 2018-10-07 DIAGNOSIS — N898 Other specified noninflammatory disorders of vagina: Secondary | ICD-10-CM

## 2018-10-07 LAB — POCT WET PREP (WET MOUNT)
Clue Cells Wet Prep Whiff POC: NEGATIVE
TRICHOMONAS WET PREP HPF POC: ABSENT

## 2018-10-07 LAB — POCT GLYCOSYLATED HEMOGLOBIN (HGB A1C): HBA1C, POC (CONTROLLED DIABETIC RANGE): 13.5 % — AB (ref 0.0–7.0)

## 2018-10-07 MED ORDER — SEMAGLUTIDE (1 MG/DOSE) 2 MG/1.5ML ~~LOC~~ SOPN: 1.0000 mg | PEN_INJECTOR | SUBCUTANEOUS | 11 refills | Status: DC

## 2018-10-07 MED ORDER — FLUCONAZOLE 150 MG PO TABS: 150.0000 mg | ORAL_TABLET | Freq: Once | ORAL | 0 refills | Status: AC

## 2018-10-07 MED ORDER — METFORMIN HCL 1000 MG PO TABS: 1000.0000 mg | ORAL_TABLET | Freq: Two times a day (BID) | ORAL | 3 refills | Status: DC

## 2018-10-07 NOTE — Assessment & Plan Note (Signed)
Not at goal but just took medications 30 minutes before visit.  Asked her to bring in all her medications and will check labs today

## 2018-10-07 NOTE — Assessment & Plan Note (Signed)
Has been out of most medications.  She will check with her new insurance to see what they will cover and follow up soon.  Check labs today

## 2018-10-07 NOTE — Progress Notes (Signed)
Subjective  Erin Schultz is a 52 y.o. female is presenting with the following  VAGINAL ITCHING For last week.  No discharge.  Feels like yeast infection she had years ago.  Had hysterectomy.  No concerns for STDs or any lesions or bleeding  DIABETES Disease Monitoring: Has new insurance so she is hoping will pay for the medications she has not been able to afford.  Blood Sugar ranges(Severity) -not checking  Associated Symptoms- Polyuria/phagia/dipsia- no      Visual problems- no Medications: Compliance(Modifying factor) - not taking anything but metformin for dm Hypoglycemic symptoms- no Timing - continuous  Monitoring Labs and Parameters Last A1C:  Lab Results  Component Value Date   HGBA1C 13.5 (A) 10/07/2018   Last Lipid:     Component Value Date/Time   CHOL 229 (H) 12/24/2016 1648   HDL 57 12/24/2016 1648   LDLDIRECT 150 (H) 05/10/2014 1128   Last Bmet  Potassium  Date Value Ref Range Status  12/24/2016 4.4 3.5 - 5.3 mmol/L Final   Sodium  Date Value Ref Range Status  12/24/2016 138 135 - 146 mmol/L Final   Creat  Date Value Ref Range Status  12/24/2016 1.03 0.50 - 1.05 mg/dL Final    Comment:      For patients > or = 53 years of age: The upper reference limit for Creatinine is approximately 13% higher for people identified as African-American.            HYPERTENSION Disease Monitoring  Home BP Monitoring (Severity) not checking Symptoms - Chest pain- no    Dyspnea- no Medications (Modifying factors) Compliance-  Just took her medications about 30 minutes ago. Lightheadedness-  no  Edema- no Timing - continuous  Duration - years ROS - See HPI  PMH Lab Review   Potassium  Date Value Ref Range Status  12/24/2016 4.4 3.5 - 5.3 mmol/L Final   Sodium  Date Value Ref Range Status  12/24/2016 138 135 - 146 mmol/L Final   Creat  Date Value Ref Range Status  12/24/2016 1.03 0.50 - 1.05 mg/dL Final    Comment:      For patients > or = 53 years of  age: The upper reference limit for Creatinine is approximately 13% higher for people identified as African-American.         Chief Complaint noted Review of Symptoms - see HPI PMH - Smoking status noted.    Objective Vital Signs reviewed BP (!) 154/80   Pulse (!) 111   Temp 98.6 F (37 C) (Oral)   Ht 5\' 7"  (1.702 m)   Wt 268 lb (121.6 kg)   SpO2 99%   BMI 41.97 kg/m  Heart - Regular rate and rhythm.  No murmurs, gallops or rubs.    Lungs:  Normal respiratory effort, chest expands symmetrically. Lungs are clear to auscultation, no crackles or wheezes. Extremities:  No cyanosis, edema, or deformity noted with good range of motion of all major joints.   Genitalia:  Normal introitus for age, no external lesions, white  vaginal discharge, mucosa pink and moist, no vaginal  lesions, no vaginal atrophy, no friaility or hemorrhage,No cervix seen  Assessments/Plans  See after visit summary for details of patient instuctions  Poorly controlled diabetes mellitus ([place]) Has been out of most medications.  She will check with her new insurance to see what they will cover and follow up soon.  Check labs today   Primary hypertension Not at goal but just took medications 30  minutes before visit.  Asked her to bring in all her medications and will check labs today   Yeast vaginitis Likely due to poorly controlled diabetes.  Will treat with diflucan and work to get her diabetes medications

## 2018-10-07 NOTE — Patient Instructions (Addendum)
Good to see you today!  Thanks for coming in.  For the Yeast Infection - take diflucan pill once,  Can repeat in a week if not resolved   For Diabetes  - I sent in an Rx for Ozempic, - Check to see if the other diabetes medications are covered by your insurance - Make an appointment with Dr Varney Daily in the next two weeks or if not available Dr Valentina Lucks  For the Blood pressure  - keep taking you medications as you are - I will call you if your lab tests are not normal.  Otherwise we will discuss them at your next visit.  Bring all your medication bottles next visit  [place] your mother's surgery goes well

## 2018-10-07 NOTE — Assessment & Plan Note (Signed)
Likely due to poorly controlled diabetes.  Will treat with diflucan and work to get her diabetes medications

## 2018-10-08 ENCOUNTER — Telehealth: Payer: Self-pay

## 2018-10-08 ENCOUNTER — Other Ambulatory Visit: Payer: Self-pay | Admitting: Family Medicine

## 2018-10-08 LAB — CMP14+EGFR
A/G RATIO: 1.4 (ref 1.2–2.2)
ALT: 11 IU/L (ref 0–32)
AST: 13 IU/L (ref 0–40)
Albumin: 4.2 g/dL (ref 3.5–5.5)
Alkaline Phosphatase: 86 IU/L (ref 39–117)
BILIRUBIN TOTAL: 0.4 mg/dL (ref 0.0–1.2)
BUN/Creatinine Ratio: 18 (ref 9–23)
BUN: 19 mg/dL (ref 6–24)
CO2: 18 mmol/L — ABNORMAL LOW (ref 20–29)
Calcium: 10.1 mg/dL (ref 8.7–10.2)
Chloride: 98 mmol/L (ref 96–106)
Creatinine, Ser: 1.06 mg/dL — ABNORMAL HIGH (ref 0.57–1.00)
GFR calc Af Amer: 69 mL/min/{1.73_m2} (ref >59)
GFR, EST NON AFRICAN AMERICAN: 60 mL/min/{1.73_m2} (ref >59)
GLUCOSE: 414 mg/dL — AB (ref 65–99)
Globulin, Total: 2.9 g/dL (ref 1.5–4.5)
POTASSIUM: 4.4 mmol/L (ref 3.5–5.2)
SODIUM: 136 mmol/L (ref 134–144)
TOTAL PROTEIN: 7.1 g/dL (ref 6.0–8.5)

## 2018-10-08 MED ORDER — SEMAGLUTIDE(0.25 OR 0.5MG/DOS) 2 MG/1.5ML ~~LOC~~ SOPN: 0.2500 mg | PEN_INJECTOR | SUBCUTANEOUS | 3 refills | Status: DC

## 2018-10-08 MED ORDER — BAYER CONTOUR MONITOR DEVI: 0 refills | Status: DC

## 2018-10-08 MED ORDER — GLUCOSE BLOOD VI STRP: ORAL_STRIP | 2 refills | Status: DC

## 2018-10-08 NOTE — Telephone Encounter (Signed)
Please let her know to inject 0.25 mg once weekly for 4 weeks then increase to 0.5 mg once weekly for Ozempic  I sent in the Rx for monitor and strips

## 2018-10-08 NOTE — Telephone Encounter (Signed)
Pt informed.  , CMA  

## 2018-10-08 NOTE — Telephone Encounter (Signed)
Patient called with following information/questions:  1. FYI - she is going to take Ozempic which is covered by her insurance.  2. Wants to clarify Ozempic dose. Was on 5 mg a week when she stopped it approx 6 months ago. Do you want her to start at 1 mg/week as per Rx?  3. Please send new Rx for [employer] Contour meter and strips (twice daily testing) to [facility].  Call back for dose question is [phone]  Danley Danker, RN [facility] RN)

## 2018-10-13 NOTE — Telephone Encounter (Signed)
Rx needs to say how often she is testing. Tyquan Carmickle, [place], CMA

## 2018-10-29 ENCOUNTER — Other Ambulatory Visit: Payer: Self-pay | Admitting: Family Medicine

## 2018-11-11 ENCOUNTER — Other Ambulatory Visit: Payer: Self-pay | Admitting: Family Medicine

## 2018-11-11 DIAGNOSIS — I10 Essential (primary) hypertension: Secondary | ICD-10-CM

## 2018-11-11 DIAGNOSIS — E1165 Type 2 diabetes mellitus with hyperglycemia: Secondary | ICD-10-CM

## 2018-12-04 ENCOUNTER — Other Ambulatory Visit: Payer: Self-pay | Admitting: Family Medicine

## 2018-12-07 LAB — HM DIABETES EYE EXAM

## 2018-12-25 ENCOUNTER — Other Ambulatory Visit: Payer: Self-pay

## 2018-12-25 ENCOUNTER — Encounter: Payer: Self-pay | Admitting: Family Medicine

## 2018-12-25 ENCOUNTER — Ambulatory Visit (INDEPENDENT_AMBULATORY_CARE_PROVIDER_SITE_OTHER): Payer: BC Managed Care – PPO | Admitting: Family Medicine

## 2018-12-25 VITALS — BP 144/82 | HR 124 | Temp 101.7°F | Ht 67.0 in | Wt 282.6 lb

## 2018-12-25 DIAGNOSIS — J069 Acute upper respiratory infection, unspecified: Secondary | ICD-10-CM | POA: Diagnosis not present

## 2018-12-25 DIAGNOSIS — R6889 Other general symptoms and signs: Secondary | ICD-10-CM | POA: Diagnosis not present

## 2018-12-25 DIAGNOSIS — B9789 Other viral agents as the cause of diseases classified elsewhere: Secondary | ICD-10-CM | POA: Diagnosis not present

## 2018-12-25 LAB — POCT INFLUENZA A/B
INFLUENZA A, POC: NEGATIVE
INFLUENZA B, POC: NEGATIVE

## 2018-12-25 NOTE — Assessment & Plan Note (Signed)
Acute.  Negative flu swab.  Low suspicion for strep given well-appearing tonsils and presence of cough.  No signs of pneumonia.  Well-appearing on exam. - Discussed conservative management with Tylenol and ibuprofen along with honey for cough - Reviewed return precautions, RTC as needed

## 2018-12-25 NOTE — Progress Notes (Signed)
   Subjective   Patient ID: Erin Schultz    DOB: 1966/08/26, 53 y.o. female   MRN: [id number]  CC: "I am sick"  HPI: Erin Schultz is a 53 y.o. female who presents to clinic today for the following:  URI  Has been sick for 2 days. Nasal discharge: some Medications tried: Tylenol and Zyrtec Sick contacts: yes, co-worker  Symptoms Fever: yes Headache or face pain: yes Tooth pain: no Sneezing: no Scratchy throat: no Allergies: yes Muscle aches: yes, on shoulders Severe fatigue: no Stiff neck: no Shortness of breath: no Rash: no Sore throat or swollen glands: no  ROS: see HPI for pertinent.  [place]: Reviewed. Smoking status reviewed. Medications reviewed.  Objective   BP (!) 144/82   Pulse (!) 124   Temp (!) 101.7 F (38.7 C) (Oral)   Ht 5\' 7"  (1.702 m)   Wt 282 lb 9.6 oz (128.2 kg)   SpO2 96%   BMI 44.26 kg/m  Vitals and nursing note reviewed.  General: well nourished, well developed, NAD with non-toxic appearance HEENT: normocephalic, atraumatic, moist mucous membranes, clear oropharynx, ears canals patent with gray TMs  Neck: supple, non-tender without lymphadenopathy Cardiovascular: regular rate and rhythm without murmurs, rubs, or gallops Lungs: clear to auscultation bilaterally with normal work of breathing Abdomen: obese, soft, non-tender, non-distended, normoactive bowel sounds Skin: warm, dry, no rashes or lesions, cap refill < 2 seconds Extremities: warm and well perfused, normal tone, no edema  Assessment & Plan   Viral URI with cough Acute.  Negative flu swab.  Low suspicion for strep given well-appearing tonsils and presence of cough.  No signs of pneumonia.  Well-appearing on exam. - Discussed conservative management with Tylenol and ibuprofen along with honey for cough - Reviewed return precautions, RTC as needed  Orders Placed This Encounter  Procedures  . Influenza A/B   No orders of the defined types were placed in this  encounter.   Harriet Butte, [place], PGY-3 12/25/2018, 4:34 PM

## 2018-12-25 NOTE — Patient Instructions (Signed)
Thank you for coming in to see [place] today. Please see below to review our plan for today's visit.  You tested negative for the flu.  Your symptoms are likely related to a viral upper respiratory infection.  Continue Tylenol and ibuprofen as needed along with honey for your cough.  If you develop worsening symptoms after improvement beyond 10 days, please contact us.  Please call the clinic at [phone] if your symptoms worsen or you have any concerns. It was our pleasure to serve you.  Harriet [place], [place], PGY-3

## 2018-12-29 ENCOUNTER — Other Ambulatory Visit: Payer: Self-pay | Admitting: Family Medicine

## 2019-01-12 ENCOUNTER — Other Ambulatory Visit: Payer: Self-pay | Admitting: Family Medicine

## 2019-01-12 DIAGNOSIS — E1165 Type 2 diabetes mellitus with hyperglycemia: Secondary | ICD-10-CM

## 2019-02-02 ENCOUNTER — Other Ambulatory Visit: Payer: Self-pay | Admitting: Family Medicine

## 2019-02-25 ENCOUNTER — Other Ambulatory Visit: Payer: Self-pay | Admitting: Family Medicine

## 2019-03-13 ENCOUNTER — Other Ambulatory Visit: Payer: Self-pay | Admitting: Family Medicine

## 2019-04-06 ENCOUNTER — Other Ambulatory Visit: Payer: Self-pay | Admitting: Family Medicine

## 2019-05-03 ENCOUNTER — Other Ambulatory Visit: Payer: Self-pay | Admitting: Family Medicine

## 2019-06-02 ENCOUNTER — Other Ambulatory Visit: Payer: Self-pay | Admitting: *Deleted

## 2019-06-02 MED ORDER — TRIAMTERENE-HCTZ 37.5-25 MG PO TABS: 1.0000 | ORAL_TABLET | Freq: Every day | ORAL | 3 refills | Status: DC

## 2019-10-30 ENCOUNTER — Other Ambulatory Visit: Payer: Self-pay | Admitting: Family Medicine

## 2019-10-30 DIAGNOSIS — E1165 Type 2 diabetes mellitus with hyperglycemia: Secondary | ICD-10-CM

## 2019-10-30 MED ORDER — METFORMIN HCL 1000 MG PO TABS: 1000.0000 mg | ORAL_TABLET | Freq: Two times a day (BID) | ORAL | 3 refills | Status: DC

## 2019-10-30 NOTE — Telephone Encounter (Signed)
Pt is calling for a refill on her Metformin sent to [facility] on [address]. She also has made an appointment to meet her new doctor on 11/23/2019. jw

## 2019-11-16 ENCOUNTER — Other Ambulatory Visit: Payer: Self-pay | Admitting: *Deleted

## 2019-11-16 DIAGNOSIS — I10 Essential (primary) hypertension: Secondary | ICD-10-CM

## 2019-11-16 DIAGNOSIS — E1165 Type 2 diabetes mellitus with hyperglycemia: Secondary | ICD-10-CM

## 2019-11-17 MED ORDER — AMLODIPINE BESY-BENAZEPRIL HCL 5-40 MG PO CAPS: ORAL_CAPSULE | ORAL | 3 refills | Status: DC

## 2019-11-17 MED ORDER — OZEMPIC (0.25 OR 0.5 MG/DOSE) 2 MG/1.5ML ~~LOC~~ SOPN: 0.5000 mg | PEN_INJECTOR | SUBCUTANEOUS | 6 refills | Status: DC

## 2019-11-19 ENCOUNTER — Other Ambulatory Visit: Payer: Self-pay | Admitting: Family Medicine

## 2019-11-23 ENCOUNTER — Ambulatory Visit: Payer: BC Managed Care – PPO | Admitting: Family Medicine

## 2019-11-24 ENCOUNTER — Other Ambulatory Visit: Payer: Self-pay | Admitting: *Deleted

## 2019-11-25 MED ORDER — ONETOUCH ULTRA BLUE VI STRP: ORAL_STRIP | 12 refills | Status: DC

## 2019-11-30 ENCOUNTER — Ambulatory Visit: Payer: BC Managed Care – PPO | Admitting: Family Medicine

## 2019-12-16 ENCOUNTER — Other Ambulatory Visit: Payer: Self-pay | Admitting: Family Medicine

## 2019-12-16 DIAGNOSIS — Z1231 Encounter for screening mammogram for malignant neoplasm of breast: Secondary | ICD-10-CM

## 2020-01-15 ENCOUNTER — Ambulatory Visit
Admission: RE | Admit: 2020-01-15 | Discharge: 2020-01-15 | Disposition: A | Discharge status: Home / Self Care | Payer: BC Managed Care – PPO | Source: Ambulatory Visit | Attending: Family Medicine | Admitting: Family Medicine

## 2020-01-15 ENCOUNTER — Other Ambulatory Visit: Payer: Self-pay

## 2020-01-15 DIAGNOSIS — Z1231 Encounter for screening mammogram for malignant neoplasm of breast: Secondary | ICD-10-CM

## 2020-01-19 ENCOUNTER — Ambulatory Visit: Payer: BC Managed Care – PPO

## 2020-02-05 ENCOUNTER — Other Ambulatory Visit: Payer: Self-pay

## 2020-02-05 ENCOUNTER — Encounter: Payer: Self-pay | Admitting: Family Medicine

## 2020-02-05 ENCOUNTER — Ambulatory Visit: Payer: BC Managed Care – PPO | Admitting: Family Medicine

## 2020-02-05 VITALS — BP 134/64 | HR 97 | Ht 67.0 in | Wt 292.0 lb

## 2020-02-05 DIAGNOSIS — E785 Hyperlipidemia, unspecified: Secondary | ICD-10-CM | POA: Diagnosis not present

## 2020-02-05 DIAGNOSIS — E1165 Type 2 diabetes mellitus with hyperglycemia: Secondary | ICD-10-CM | POA: Diagnosis not present

## 2020-02-05 DIAGNOSIS — Z6841 Body Mass Index (BMI) 40.0 and over, adult: Secondary | ICD-10-CM

## 2020-02-05 DIAGNOSIS — I10 Essential (primary) hypertension: Secondary | ICD-10-CM | POA: Diagnosis not present

## 2020-02-05 LAB — POCT GLYCOSYLATED HEMOGLOBIN (HGB A1C): HbA1c, POC (controlled diabetic range): 8.5 % — AB (ref 0.0–7.0)

## 2020-02-05 MED ORDER — AMLODIPINE BESY-BENAZEPRIL HCL 5-40 MG PO CAPS: ORAL_CAPSULE | ORAL | 3 refills | Status: DC

## 2020-02-05 NOTE — Assessment & Plan Note (Signed)
-   Well controlled on current regimen. Hopefully, as she continues to eat well and exercise we can decrease and eventually discontinue some medications. - Cont Amlodipine-Benazepril 5-40mg  daily - Cont Maxzie 37.5-25mg  daily

## 2020-02-05 NOTE — Progress Notes (Signed)
    SUBJECTIVE:   CHIEF COMPLAINT / HPI:   HTN Patient currently taking amlodipine 5mg  and benazepril 40mg  daily. Triamterene-HCTZ 37.5-25mg . She is tolerating the medication well, no complaints such as cough, headache, dizziness, or reports of low blood pressure. She is compliant with her medication stating she takes it daily. BP in office today is at goal.  T2DM Greatly improved since last visit with A1c down from 13.5 to 8.5. She is taking Metformin, Ozempic, and Jardiance. She is exercising regularly with walking. She is going to start swimming and water exercises as soon as the [employer] opens back up. She has also been changing her diet avoiding sweets, increasing her protien intake, and decreasing carbohydrate intake and watching overall calorie intake.   HLD Patient had elevated LDL and total cholesterol of 229 in 2018. Was started on Atorvastatin 40mg . Rechecking her lipid panel today as she has T2DM and will benefit from increase in dose if her numbers are still elevated.  PERTINENT  PMH / PSH: HTN, T2DM, HLD, Obesity  OBJECTIVE:   BP 134/64   Pulse 97   Ht 5\' 7"  (1.702 m)   Wt 292 lb (132.5 kg)   SpO2 97%   BMI 45.73 kg/m   Gen: Alert and Oriented x 3, NAD CV: RRR, no murmurs, normal S1, S2 split Resp: CTAB, no wheezing, rales, or rhonchi, comfortable work of breathing Ext: no clubbing, cyanosis, or edema; see Quality Metrics for diabetic foot exam Skin: warm, dry, intact, no rashes   ASSESSMENT/PLAN:   Primary hypertension - Well controlled on current regimen. Hopefully, as she continues to eat well and exercise we can decrease and eventually discontinue some medications. - Cont Amlodipine-Benazepril 5-40mg  daily - Cont Maxzie 37.5-25mg  daily  Poorly controlled diabetes mellitus ([place]) Not to goal but much improved from last visit. A12c at 8.5% today. Cont current regimen and continue to eat balanced diet while watching caloric intake and exercise. - Cont Metfomrin 1g  BID - Cont Ozempic 0.5mg  weekly injection - Cont Jardiance 10mg  daily  HLD (hyperlipidemia) Patient last lipid panel in 2018 with elevated total cholesterol and LDL - Recheck lipid panel today; increase atorvastatin from 40 to 80mg  if still cholesterol and LDL still elevated    Nuala Alpha, DO [place]

## 2020-02-05 NOTE — Assessment & Plan Note (Signed)
Patient last lipid panel in 2018 with elevated total cholesterol and LDL - Recheck lipid panel today; increase atorvastatin from 40 to 80mg  if still cholesterol and LDL still elevated

## 2020-02-05 NOTE — Assessment & Plan Note (Signed)
Not to goal but much improved from last visit. A12c at 8.5% today. Cont current regimen and continue to eat balanced diet while watching caloric intake and exercise. - Cont Metfomrin 1g BID - Cont Ozempic 0.5mg  weekly injection - Cont Jardiance 10mg  daily

## 2020-02-05 NOTE — Patient Instructions (Signed)
It was great to see you today! Thank you for letting me participate in your care!  Today, we discussed your diabetes and blood pressure and you have made GREAT strides and we are seeing great improvement!!! Your blood pressure is under great control so keep it up!   Your diabetes is greatly improved but still not quite to the goal that we want, but we are going in the right direction. Please keep exercising, staying active, and eating healthy as this is making a huge difference. I will see you in 3 months and if it is better we can start to decrease some medications.  Be well, Harolyn Rutherford, DO PGY-3, [facility]

## 2020-02-06 LAB — BASIC METABOLIC PANEL
BUN/Creatinine Ratio: 16 (ref 9–23)
BUN: 16 mg/dL (ref 6–24)
CO2: 19 mmol/L — ABNORMAL LOW (ref 20–29)
Calcium: 10.7 mg/dL — ABNORMAL HIGH (ref 8.7–10.2)
Chloride: 103 mmol/L (ref 96–106)
Creatinine, Ser: 1.02 mg/dL — ABNORMAL HIGH (ref 0.57–1.00)
GFR calc Af Amer: 72 mL/min/{1.73_m2} (ref >59)
GFR calc non Af Amer: 62 mL/min/{1.73_m2} (ref >59)
Glucose: 199 mg/dL — ABNORMAL HIGH (ref 65–99)
Potassium: 4.5 mmol/L (ref 3.5–5.2)
Sodium: 141 mmol/L (ref 134–144)

## 2020-02-06 LAB — LIPID PANEL
Chol/HDL Ratio: 4.2 ratio (ref 0.0–4.4)
Cholesterol, Total: 236 mg/dL — ABNORMAL HIGH (ref 100–199)
HDL: 56 mg/dL (ref >39)
LDL Chol Calc (NIH): 154 mg/dL — ABNORMAL HIGH (ref 0–99)
Triglycerides: 147 mg/dL (ref 0–149)
VLDL Cholesterol Cal: 26 mg/dL (ref 5–40)

## 2020-02-09 ENCOUNTER — Other Ambulatory Visit: Payer: Self-pay | Admitting: Family Medicine

## 2020-02-09 DIAGNOSIS — E785 Hyperlipidemia, unspecified: Secondary | ICD-10-CM

## 2020-02-09 MED ORDER — PRAVASTATIN SODIUM 80 MG PO TABS: 80.0000 mg | ORAL_TABLET | Freq: Every day | ORAL | 3 refills | Status: DC

## 2020-02-09 MED ORDER — ATORVASTATIN CALCIUM 80 MG PO TABS: 80.0000 mg | ORAL_TABLET | Freq: Every day | ORAL | 2 refills | Status: DC

## 2020-02-09 NOTE — Progress Notes (Signed)
Patient with recent lipid panel with LDL >100 and total cholesterol >200. Changing to pravastatin as she had muscle aches with prevous statin.

## 2020-04-01 ENCOUNTER — Ambulatory Visit: Payer: BC Managed Care – PPO | Admitting: Family Medicine

## 2020-04-25 ENCOUNTER — Other Ambulatory Visit: Payer: Self-pay

## 2020-04-25 ENCOUNTER — Ambulatory Visit (INDEPENDENT_AMBULATORY_CARE_PROVIDER_SITE_OTHER): Payer: BC Managed Care – PPO | Admitting: Family Medicine

## 2020-04-25 ENCOUNTER — Other Ambulatory Visit: Payer: Self-pay | Admitting: Family Medicine

## 2020-04-25 VITALS — BP 145/75 | HR 97 | Ht 67.0 in | Wt 288.0 lb

## 2020-04-25 DIAGNOSIS — Z6841 Body Mass Index (BMI) 40.0 and over, adult: Secondary | ICD-10-CM

## 2020-04-25 DIAGNOSIS — I10 Essential (primary) hypertension: Secondary | ICD-10-CM | POA: Diagnosis not present

## 2020-04-25 DIAGNOSIS — E785 Hyperlipidemia, unspecified: Secondary | ICD-10-CM

## 2020-04-25 DIAGNOSIS — E1165 Type 2 diabetes mellitus with hyperglycemia: Secondary | ICD-10-CM

## 2020-04-25 LAB — POCT GLYCOSYLATED HEMOGLOBIN (HGB A1C): HbA1c, POC (controlled diabetic range): 9 % — AB (ref 0.0–7.0)

## 2020-04-25 MED ORDER — OZEMPIC (0.25 OR 0.5 MG/DOSE) 2 MG/1.5ML ~~LOC~~ SOPN: 1.0000 mg | PEN_INJECTOR | SUBCUTANEOUS | 6 refills | Status: DC

## 2020-04-25 MED ORDER — PRAVASTATIN SODIUM 80 MG PO TABS: 40.0000 mg | ORAL_TABLET | Freq: Every day | ORAL | 3 refills | Status: DC

## 2020-04-25 NOTE — Progress Notes (Signed)
    SUBJECTIVE:   CHIEF COMPLAINT / HPI:   T2DM Follow Up Patient presents to clinic today for follow up for her Type 2 diabetes. Overall, she continues to remain active and is still losing weight with diet and exercise. She has lost 4 lbs since her last appointment 3 months ago. He is compliant with her medications and states she has not missed any doses since her last visit and denies any adverse effects from taking her medications. She did have some GI upset when first starting them but that has resolved. She remains excited and committed to getting better control of her diabetes.  HLD Patient did have muscle aches when taking full dose of the statin I prescribed so she bought a pill cutter and she started taking 1/2 pill per day. She states after doing this her symptoms resolved and she felt much better. I offered her to choice of trying to change to a different statin but she is happy with the current dose she is on. Discussed that she should continue that and we will recheck lipids in 6 months. Encouraged her that continued exercise and weight loss will help drop her cholesterol.  HTN Slightly above goal. Patient compliant with medications and is not missing doses. Given we will be making changes to her diabetes medications and she is only slightly above goal here in office today I will not make changes to her HTN meds today and will monitor at next follow up. No headaches, dizziness, or vision changes.   PERTINENT  PMH / PSH: T2DM, HLD, HTN  OBJECTIVE:   BP (!) 145/75   Pulse 97   Ht 5\' 7"  (1.702 m)   Wt 288 lb (130.6 kg)   SpO2 99%   BMI 45.11 kg/m   Gen: NAD Cardiac: RRR, no murmurs Resp: CTAB, no wheezing, no crackles Skin: No rashes  ASSESSMENT/PLAN:   Primary hypertension Slightly above goal at office visit today but has been well controlled at previous office visits on current regimen. No changes today but if she remains elevated at next visit I suggest increasing dose  of one of her medications. Would like to avoid adding an additional medication to prevent polypharmacy.  Poorly controlled diabetes mellitus ([place]) Still not to goal and despite exercise and loss of 4 lbs her A1c has increased to 9% up slightly from last visit.  - Increasing her Ozempic from 0.5mg  to 1mg  weekly. - Recheck A1c in 3 months - Encouraged her that continued exercise and weight loss will help her get better control of her diabetes  HLD (hyperlipidemia) Patient could not tolerate 80mg  of pravastatin but is doing well on 40mg .  - cont 40mg  of pravastatin, recheck lipid panel in 6 months     Nuala Alpha, [place]

## 2020-04-25 NOTE — Patient Instructions (Addendum)
It was great to see you today! Thank you for letting me participate in your care!  Today, we discussed your continued weight loss and you are doing great!!!  I am so glad you are continuing to stay active and exercise and you are watching what you eat. Please continue to do this as it will continue to benefit your health.  I have increased your Ozempic from 0.5mg  to 1mg  weekly. Please take it as prescribed.  Be well, Harolyn Rutherford, DO PGY-3, [facility]

## 2020-04-26 ENCOUNTER — Other Ambulatory Visit: Payer: Self-pay | Admitting: Family Medicine

## 2020-04-26 MED ORDER — OZEMPIC (1 MG/DOSE) 2 MG/1.5ML ~~LOC~~ SOPN: 1.0000 mg | PEN_INJECTOR | SUBCUTANEOUS | 6 refills | Status: DC

## 2020-04-26 NOTE — Progress Notes (Signed)
Increasing dose from 0.5mg  weekly to 1mg  weekly

## 2020-04-27 NOTE — Assessment & Plan Note (Signed)
Slightly above goal at office visit today but has been well controlled at previous office visits on current regimen. No changes today but if she remains elevated at next visit I suggest increasing dose of one of her medications. Would like to avoid adding an additional medication to prevent polypharmacy.

## 2020-04-27 NOTE — Assessment & Plan Note (Addendum)
Still not to goal and despite exercise and loss of 4 lbs her A1c has increased to 9% up slightly from last visit.  - Increasing her Ozempic from 0.5mg  to 1mg  weekly. - Recheck A1c in 3 months - Encouraged her that continued exercise and weight loss will help her get better control of her diabetes

## 2020-04-27 NOTE — Assessment & Plan Note (Addendum)
Patient could not tolerate 80mg  of pravastatin but is doing well on 40mg .  - cont 40mg  of pravastatin, recheck lipid panel in 6 months

## 2020-05-01 ENCOUNTER — Other Ambulatory Visit: Payer: Self-pay | Admitting: Family Medicine

## 2020-05-27 ENCOUNTER — Other Ambulatory Visit: Payer: Self-pay

## 2020-05-27 MED ORDER — OZEMPIC (1 MG/DOSE) 2 MG/1.5ML ~~LOC~~ SOPN: 1.0000 mg | PEN_INJECTOR | SUBCUTANEOUS | 6 refills | Status: DC

## 2020-10-21 ENCOUNTER — Other Ambulatory Visit: Payer: Self-pay | Admitting: Family Medicine

## 2020-10-21 DIAGNOSIS — E1165 Type 2 diabetes mellitus with hyperglycemia: Secondary | ICD-10-CM

## 2020-10-21 NOTE — Telephone Encounter (Signed)
Called and scheduled patient for follow up appointment.

## 2020-10-21 NOTE — Telephone Encounter (Signed)
Needs appt in next three months.

## 2020-11-09 ENCOUNTER — Ambulatory Visit: Payer: BC Managed Care – PPO | Admitting: Family Medicine

## 2020-11-11 ENCOUNTER — Other Ambulatory Visit: Payer: Self-pay | Admitting: Family Medicine

## 2020-11-11 DIAGNOSIS — E1165 Type 2 diabetes mellitus with hyperglycemia: Secondary | ICD-10-CM

## 2020-11-14 NOTE — Telephone Encounter (Signed)
Needs appointment

## 2020-11-15 NOTE — Telephone Encounter (Signed)
Called and lvm to call back and schedule appointment.  

## 2020-12-01 ENCOUNTER — Other Ambulatory Visit: Payer: Self-pay | Admitting: Family Medicine

## 2020-12-01 DIAGNOSIS — Z1231 Encounter for screening mammogram for malignant neoplasm of breast: Secondary | ICD-10-CM

## 2020-12-15 NOTE — Progress Notes (Unsigned)
SUBJECTIVE:   CHIEF COMPLAINT / HPI:   T2DM Current regimen: Ozempic 1 mg weekly, Metformin 1000 mg twice daily Had an Rx for jardiance, but never started it CBGs checks sporadically, two days ago her AM was 250, has been as low as 193, highest was 260 Last A1c on 04/15/2020 was 9, A1c today 10.6 Last BMP on 02/05/2020, creatinine 1.02 at that time On ACE Eye exam, last in December, didn't see anything, [place]  HLD Last lipid panel 02/05/2020, LDL 154 at that time On pravastatin 40 mg nightly, didn't tolerate 80mg   Hypertension  Current regimen: Amlodipine-Benzapril 5-40 mg daily, triamterene-HCTZ 37.5-25 mg daily Does not check BP at home Last BMP 02/05/2020  Hypercalcemia Last BMP with calcium 10.7 5 years ago also had one elevated to 11.1 Others have been within normal limits  Covid vaccinated and booster on 12/29 Wants shingles shot Needs flu, Tdap vaccine, declines flu, will see if had Tdap Needs Colonoscopy Pap smear - had total hysterectomy for fibroids and endometriosis   PERTINENT  PMH / PSH: HTN, allergic rhinitis, T2DM, HLD, morbid obesity  OBJECTIVE:   BP (!) 150/80   Pulse (!) 105   Wt 292 lb 3.2 oz (132.5 kg)   SpO2 99%   BMI 45.76 kg/m     Physical Exam:  General: 55 y.o. female in NAD Cardio: RRR no m/r/g Lungs: CTAB, no wheezing, no rhonchi, no crackles, no IWOB on RA Skin: warm and dry Extremities: No edema   ASSESSMENT/PLAN:   Poorly controlled diabetes mellitus (H[facility]) A1c elevated today to 10.6.  Check CBGs sporadically.  We will continue Ozempic 1 mg weekly and Metformin 1000 mg twice daily.  We will also add Jardiance, will start 10 mg and titrate up as able.  Will obtain BMP today.  Also sent refill for test strips.  She is on ACE and a statin.  She was educated on SGLT2 inhibitors and increased risk of dehydration especially while sick and advised that if she is ever sick and unable to eat that she should discontinue use of her SGLT2  inhibitor until she is feeling better and able to tolerate a diet.  Also advised of increased risk of yeast infection and advised to avoid foods high in sugar.  We will have her come back in 2 weeks for repeat BMP and she will follow-up in 4 weeks with this provider.  She was advised to take her sugars more regularly and keep a log to come back.  Asked for her to have eye exam results faxed to the office.  Hyperlipidemia She has only been able to tolerate pravastatin 40 mg daily.  Would like her on a higher intensity statin.  Her last LDL was much higher than goal, but will wait on direct LDL as she is due for a lipid panel next month.  We will perform her lipid panel at the end of March.  For now continue pravastatin.  Primary hypertension Her blood pressure is elevated.  She has been on quadruple therapy for quite some time.  Blood pressure initially was systolic at 409, on recheck improved to 150/80.  She does have room for improvement.  Will increase amlodipine portion to 10 mg.  She is now on amlodipine-benazepril 10-40 mg daily.  Will obtain a BMP today.  She will have a repeat BMP in 2 weeks and follow-up in 4 weeks.  Hypercalcemia Noted on last BMP, elevated to 10.7.  5 years ago had an elevated level  to 11.1.  Previously others have been within normal limits.  Will obtain a BMP today.   HIV and hepatitis C antibody obtained today for screening purposes.  She was referred to GI for colonoscopy.  She was advised to obtain a shingles shot at her pharmacy.  Cleophas Dunker, [place]

## 2020-12-16 ENCOUNTER — Encounter: Payer: Self-pay | Admitting: Family Medicine

## 2020-12-16 ENCOUNTER — Ambulatory Visit: Payer: BC Managed Care – PPO | Admitting: Family Medicine

## 2020-12-16 ENCOUNTER — Other Ambulatory Visit: Payer: Self-pay

## 2020-12-16 VITALS — BP 150/80 | HR 105 | Wt 292.2 lb

## 2020-12-16 DIAGNOSIS — Z1211 Encounter for screening for malignant neoplasm of colon: Secondary | ICD-10-CM

## 2020-12-16 DIAGNOSIS — Z1159 Encounter for screening for other viral diseases: Secondary | ICD-10-CM

## 2020-12-16 DIAGNOSIS — E1165 Type 2 diabetes mellitus with hyperglycemia: Secondary | ICD-10-CM

## 2020-12-16 DIAGNOSIS — I10 Essential (primary) hypertension: Secondary | ICD-10-CM

## 2020-12-16 DIAGNOSIS — E785 Hyperlipidemia, unspecified: Secondary | ICD-10-CM

## 2020-12-16 DIAGNOSIS — Z114 Encounter for screening for human immunodeficiency virus [HIV]: Secondary | ICD-10-CM

## 2020-12-16 LAB — POCT GLYCOSYLATED HEMOGLOBIN (HGB A1C): Hemoglobin A1C: 10.6 % — AB (ref 4.0–5.6)

## 2020-12-16 MED ORDER — OZEMPIC (1 MG/DOSE) 2 MG/1.5ML ~~LOC~~ SOPN: 1.0000 mg | PEN_INJECTOR | SUBCUTANEOUS | 6 refills | Status: DC

## 2020-12-16 MED ORDER — AMLODIPINE BESY-BENAZEPRIL HCL 10-40 MG PO CAPS: 1.0000 | ORAL_CAPSULE | Freq: Every day | ORAL | 3 refills | Status: DC

## 2020-12-16 MED ORDER — GLUCOSE BLOOD VI STRP: ORAL_STRIP | 2 refills | Status: DC

## 2020-12-16 MED ORDER — PRAVASTATIN SODIUM 40 MG PO TABS: 40.0000 mg | ORAL_TABLET | Freq: Every day | ORAL | Status: DC

## 2020-12-16 MED ORDER — EMPAGLIFLOZIN 10 MG PO TABS: 10.0000 mg | ORAL_TABLET | Freq: Every day | ORAL | 3 refills | Status: DC

## 2020-12-16 NOTE — Assessment & Plan Note (Signed)
A1c elevated today to 10.6.  Check CBGs sporadically.  We will continue Ozempic 1 mg weekly and Metformin 1000 mg twice daily.  We will also add Jardiance, will start 10 mg and titrate up as able.  Will obtain BMP today.  Also sent refill for test strips.  She is on ACE and a statin.  She was educated on SGLT2 inhibitors and increased risk of dehydration especially while sick and advised that if she is ever sick and unable to eat that she should discontinue use of her SGLT2 inhibitor until she is feeling better and able to tolerate a diet.  Also advised of increased risk of yeast infection and advised to avoid foods high in sugar.  We will have her come back in 2 weeks for repeat BMP and she will follow-up in 4 weeks with this provider.  She was advised to take her sugars more regularly and keep a log to come back.  Asked for her to have eye exam results faxed to the office.

## 2020-12-16 NOTE — Assessment & Plan Note (Signed)
Noted on last BMP, elevated to 10.7.  5 years ago had an elevated level to 11.1.  Previously others have been within normal limits.  Will obtain a BMP today.

## 2020-12-16 NOTE — Assessment & Plan Note (Signed)
She has only been able to tolerate pravastatin 40 mg daily.  Would like her on a higher intensity statin.  Her last LDL was much higher than goal, but will wait on direct LDL as she is due for a lipid panel next month.  We will perform her lipid panel at the end of March.  For now continue pravastatin.

## 2020-12-16 NOTE — Patient Instructions (Addendum)
Thank you for coming to see me today. It was a pleasure. Today we talked about:   We will get some labs today.  If they are abnormal or we need to do something about them, I will call you.  If they are normal, I will send you a message on MyChart (if it is active) or a letter in the mail.  If you don't hear from [place] in 2 weeks, please call the office at the number below.  We will start your jardiance or if your insurance needs something else, let me know.  Remember if you get sick and can't eat, to stop taking this medicine.    We increased the amlodipine portion of your blood pressure medication.   Come back in 2 weeks for just blood work.  Please contact Dr Jenne Campus for a nutrition appointment to discuss your nutrition needs.  [phone].  You are due for a colonoscopy.  Please use the form that we have given you to schedule this at your convenience.   Please follow-up with me in 4 weeks.  If you have any questions or concerns, please do not hesitate to call the office at [phone].  Best,   [place] Constable, DO

## 2020-12-16 NOTE — Assessment & Plan Note (Signed)
Her blood pressure is elevated.  She has been on quadruple therapy for quite some time.  Blood pressure initially was systolic at 465, on recheck improved to 150/80.  She does have room for improvement.  Will increase amlodipine portion to 10 mg.  She is now on amlodipine-benazepril 10-40 mg daily.  Will obtain a BMP today.  She will have a repeat BMP in 2 weeks and follow-up in 4 weeks.

## 2020-12-17 ENCOUNTER — Encounter: Payer: Self-pay | Admitting: Family Medicine

## 2020-12-17 DIAGNOSIS — E785 Hyperlipidemia, unspecified: Secondary | ICD-10-CM

## 2020-12-17 LAB — HIV ANTIBODY (ROUTINE TESTING W REFLEX): HIV Screen 4th Generation wRfx: NONREACTIVE

## 2020-12-17 LAB — BASIC METABOLIC PANEL
BUN/Creatinine Ratio: 18 (ref 9–23)
BUN: 17 mg/dL (ref 6–24)
CO2: 20 mmol/L (ref 20–29)
Calcium: 10.5 mg/dL — ABNORMAL HIGH (ref 8.7–10.2)
Chloride: 101 mmol/L (ref 96–106)
Creatinine, Ser: 0.97 mg/dL (ref 0.57–1.00)
GFR calc Af Amer: 76 mL/min/{1.73_m2} (ref >59)
GFR calc non Af Amer: 66 mL/min/{1.73_m2} (ref >59)
Glucose: 237 mg/dL — ABNORMAL HIGH (ref 65–99)
Potassium: 4.3 mmol/L (ref 3.5–5.2)
Sodium: 138 mmol/L (ref 134–144)

## 2020-12-17 LAB — HCV INTERPRETATION

## 2020-12-17 LAB — HCV AB W REFLEX TO QUANT PCR: HCV Ab: <0.1 s/co ratio (ref 0.0–0.9)

## 2020-12-20 MED ORDER — PRAVASTATIN SODIUM 40 MG PO TABS: 40.0000 mg | ORAL_TABLET | Freq: Every day | ORAL | 3 refills | Status: DC

## 2020-12-20 NOTE — Addendum Note (Signed)
Addended by: Cleophas Dunker on: 12/20/2020 01:57 PM   Modules accepted: Orders

## 2020-12-30 ENCOUNTER — Other Ambulatory Visit: Payer: Self-pay

## 2020-12-30 DIAGNOSIS — I10 Essential (primary) hypertension: Secondary | ICD-10-CM

## 2020-12-30 DIAGNOSIS — E1165 Type 2 diabetes mellitus with hyperglycemia: Secondary | ICD-10-CM

## 2020-12-31 LAB — BASIC METABOLIC PANEL
BUN/Creatinine Ratio: 23 (ref 9–23)
BUN: 31 mg/dL — ABNORMAL HIGH (ref 6–24)
CO2: 17 mmol/L — ABNORMAL LOW (ref 20–29)
Calcium: 10.7 mg/dL — ABNORMAL HIGH (ref 8.7–10.2)
Chloride: 100 mmol/L (ref 96–106)
Creatinine, Ser: 1.34 mg/dL — ABNORMAL HIGH (ref 0.57–1.00)
GFR calc Af Amer: 51 mL/min/{1.73_m2} — ABNORMAL LOW (ref >59)
GFR calc non Af Amer: 45 mL/min/{1.73_m2} — ABNORMAL LOW (ref >59)
Glucose: 296 mg/dL — ABNORMAL HIGH (ref 65–99)
Potassium: 4.5 mmol/L (ref 3.5–5.2)
Sodium: 137 mmol/L (ref 134–144)

## 2021-01-11 ENCOUNTER — Ambulatory Visit: Payer: Self-pay | Admitting: Family Medicine

## 2021-01-16 ENCOUNTER — Ambulatory Visit
Admission: RE | Admit: 2021-01-16 | Discharge: 2021-01-16 | Disposition: A | Discharge status: Home / Self Care | Payer: BC Managed Care – PPO | Source: Ambulatory Visit | Attending: *Deleted | Admitting: *Deleted

## 2021-01-16 ENCOUNTER — Other Ambulatory Visit: Payer: Self-pay

## 2021-01-16 DIAGNOSIS — Z1231 Encounter for screening mammogram for malignant neoplasm of breast: Secondary | ICD-10-CM

## 2021-01-30 NOTE — Progress Notes (Signed)
    SUBJECTIVE:   CHIEF COMPLAINT / HPI:   T2DM Current regimen: Ozempic 1 mg weekly, Metformin 1000 mg twice daily, Jardiance 10 mg, started on 12/16/2020 Last A1c 10.6 on 2/4 Last BMP 2/18 with 30% increase in creatinine CBGs improving, 173 this AM, since starting Jardiance, highest was 203 towards when she started it On benazepril and pravastatin Last lipid panel 02/05/2020, LDL 154 She has lost 9 lbs since last visit Eye exam has had in the last year, asked for records to be sent, was told no diabetes changes  Hyperlipidemia Last lipid panel per above Pravastatin 40 mg nightly, was unable to tolerate 80 mg Takes every day Denies chest pain, shortness of breath, leg cramping  Hypertension Current regimen: Amlodipine-benazepril 10-40 mg daily, triamterene-hydrochlorothiazide 37.5-25 mg daily Does not check BPs Last BMP 2/18 per above At last visit on 2/4, patient's amlodipine portion was increased from 5 mg to 10 mg  She is scheduled for her colonoscopy  Has had COVID Booster, 12/29  PERTINENT  PMH / PSH: HTN, allergic rhinitis, T2DM, HLD, morbid obesity  OBJECTIVE:   BP 135/80   Pulse 97   Ht 5\' 7"  (1.702 m)   Wt 283 lb 6.4 oz (128.5 kg)   SpO2 99%   BMI 44.39 kg/m    Physical Exam:  General: 55 y.o. female in NAD Cardio: RRR no m/r/g Lungs: CTAB, no wheezing, no rhonchi, no crackles, no IWOB on RA Skin: warm and dry Extremities: No edema   ASSESSMENT/PLAN:   Poorly controlled diabetes mellitus ([place]) Improving on Jardiance.  She had a 30% increase in her creatinine after starting Jardiance.  Will obtain a BMP today to ensure that this is improved.  She has had improvement, will increase her to Jardiance 25 mg.  Continue Ozempic and Metformin.  Obtain BMP and lipid panel.  Follow-up in May for repeat A1c.  Up-to-date on eye exam.  Congratulated on weight loss.  Hyperlipidemia On pravastatin 40 mg nightly due to intolerance of higher doses.  Obtain lipid panel  today.  Can consider adding Zetia if LDL still not at goal.  Her goal should be less than 100.  Primary hypertension BP improving with the increase in amlodipine portion of amlodipine-benazepril.  Continue all of her current medications.  Follow-up in May.  BMP today.     Cleophas Dunker, [place]

## 2021-01-31 ENCOUNTER — Ambulatory Visit (INDEPENDENT_AMBULATORY_CARE_PROVIDER_SITE_OTHER): Payer: BC Managed Care – PPO | Admitting: Family Medicine

## 2021-01-31 ENCOUNTER — Encounter: Payer: Self-pay | Admitting: Family Medicine

## 2021-01-31 ENCOUNTER — Other Ambulatory Visit: Payer: Self-pay

## 2021-01-31 VITALS — BP 135/80 | HR 97 | Ht 67.0 in | Wt 283.4 lb

## 2021-01-31 DIAGNOSIS — I10 Essential (primary) hypertension: Secondary | ICD-10-CM

## 2021-01-31 DIAGNOSIS — E785 Hyperlipidemia, unspecified: Secondary | ICD-10-CM | POA: Diagnosis not present

## 2021-01-31 DIAGNOSIS — E1165 Type 2 diabetes mellitus with hyperglycemia: Secondary | ICD-10-CM | POA: Diagnosis not present

## 2021-01-31 NOTE — Patient Instructions (Signed)
Thank you for coming to see me today. It was a pleasure. Today we talked about:   We will get some labs today.  If they are abnormal or we need to do something about them, I will call you.  If they are normal, I will send you a message on MyChart (if it is active) or a letter in the mail.  If you don't hear from [place] in 2 weeks, please call the office at the number below.  Please follow-up with me in May.  If you have any questions or concerns, please do not hesitate to call the office at [phone].  Best,   [place] Constable, DO

## 2021-01-31 NOTE — Assessment & Plan Note (Signed)
On pravastatin 40 mg nightly due to intolerance of higher doses.  Obtain lipid panel today.  Can consider adding Zetia if LDL still not at goal.  Her goal should be less than 100.

## 2021-01-31 NOTE — Assessment & Plan Note (Signed)
BP improving with the increase in amlodipine portion of amlodipine-benazepril.  Continue all of her current medications.  Follow-up in May.  BMP today.

## 2021-01-31 NOTE — Assessment & Plan Note (Signed)
Improving on Jardiance.  She had a 30% increase in her creatinine after starting Jardiance.  Will obtain a BMP today to ensure that this is improved.  She has had improvement, will increase her to Jardiance 25 mg.  Continue Ozempic and Metformin.  Obtain BMP and lipid panel.  Follow-up in May for repeat A1c.  Up-to-date on eye exam.  Congratulated on weight loss.

## 2021-02-01 ENCOUNTER — Other Ambulatory Visit: Payer: Self-pay | Admitting: Family Medicine

## 2021-02-01 DIAGNOSIS — E1165 Type 2 diabetes mellitus with hyperglycemia: Secondary | ICD-10-CM

## 2021-02-01 LAB — BASIC METABOLIC PANEL
BUN/Creatinine Ratio: 19 (ref 9–23)
BUN: 24 mg/dL (ref 6–24)
CO2: 18 mmol/L — ABNORMAL LOW (ref 20–29)
Calcium: 10.8 mg/dL — ABNORMAL HIGH (ref 8.7–10.2)
Chloride: 100 mmol/L (ref 96–106)
Creatinine, Ser: 1.28 mg/dL — ABNORMAL HIGH (ref 0.57–1.00)
Glucose: 177 mg/dL — ABNORMAL HIGH (ref 65–99)
Potassium: 4.4 mmol/L (ref 3.5–5.2)
Sodium: 140 mmol/L (ref 134–144)
eGFR: 49 mL/min/{1.73_m2} — ABNORMAL LOW (ref >59)

## 2021-02-01 LAB — LIPID PANEL
Chol/HDL Ratio: 3.6 ratio (ref 0.0–4.4)
Cholesterol, Total: 192 mg/dL (ref 100–199)
HDL: 53 mg/dL (ref >39)
LDL Chol Calc (NIH): 95 mg/dL (ref 0–99)
Triglycerides: 266 mg/dL — ABNORMAL HIGH (ref 0–149)
VLDL Cholesterol Cal: 44 mg/dL — ABNORMAL HIGH (ref 5–40)

## 2021-02-01 MED ORDER — EMPAGLIFLOZIN 25 MG PO TABS: 25.0000 mg | ORAL_TABLET | Freq: Every day | ORAL | 1 refills | Status: DC

## 2021-03-17 ENCOUNTER — Other Ambulatory Visit: Payer: Self-pay | Admitting: Family Medicine

## 2021-03-17 DIAGNOSIS — I10 Essential (primary) hypertension: Secondary | ICD-10-CM

## 2021-03-17 DIAGNOSIS — E1165 Type 2 diabetes mellitus with hyperglycemia: Secondary | ICD-10-CM

## 2021-03-17 MED ORDER — AMLODIPINE BESY-BENAZEPRIL HCL 10-40 MG PO CAPS: 1.0000 | ORAL_CAPSULE | Freq: Every day | ORAL | 3 refills | Status: DC

## 2021-03-27 ENCOUNTER — Other Ambulatory Visit: Payer: Self-pay | Admitting: Family Medicine

## 2021-03-27 ENCOUNTER — Encounter: Payer: BC Managed Care – PPO | Admitting: Gastroenterology

## 2021-03-27 DIAGNOSIS — E1165 Type 2 diabetes mellitus with hyperglycemia: Secondary | ICD-10-CM

## 2021-04-15 ENCOUNTER — Other Ambulatory Visit: Payer: Self-pay | Admitting: Family Medicine

## 2021-04-26 ENCOUNTER — Other Ambulatory Visit: Payer: Self-pay | Admitting: Family Medicine

## 2021-04-26 DIAGNOSIS — E785 Hyperlipidemia, unspecified: Secondary | ICD-10-CM

## 2021-04-26 DIAGNOSIS — E1165 Type 2 diabetes mellitus with hyperglycemia: Secondary | ICD-10-CM

## 2021-04-27 NOTE — Telephone Encounter (Signed)
Needs appointment in 1-2 months.

## 2021-05-03 NOTE — Telephone Encounter (Signed)
Called patient to schedule appointment. She will call back when she figures out her work schedule.

## 2021-05-20 ENCOUNTER — Other Ambulatory Visit: Payer: Self-pay | Admitting: Family Medicine

## 2021-06-16 ENCOUNTER — Ambulatory Visit (INDEPENDENT_AMBULATORY_CARE_PROVIDER_SITE_OTHER): Payer: BC Managed Care – PPO | Admitting: Student

## 2021-06-16 ENCOUNTER — Encounter: Payer: Self-pay | Admitting: Student

## 2021-06-16 ENCOUNTER — Other Ambulatory Visit: Payer: Self-pay

## 2021-06-16 VITALS — BP 130/64 | HR 95 | Ht 67.0 in | Wt 281.0 lb

## 2021-06-16 DIAGNOSIS — E1165 Type 2 diabetes mellitus with hyperglycemia: Secondary | ICD-10-CM | POA: Diagnosis not present

## 2021-06-16 DIAGNOSIS — J3089 Other allergic rhinitis: Secondary | ICD-10-CM

## 2021-06-16 DIAGNOSIS — I10 Essential (primary) hypertension: Secondary | ICD-10-CM

## 2021-06-16 LAB — POCT GLYCOSYLATED HEMOGLOBIN (HGB A1C): HbA1c, POC (controlled diabetic range): 8.6 % — AB (ref 0.0–7.0)

## 2021-06-16 MED ORDER — DESLORATADINE 5 MG PO TABS: 5.0000 mg | ORAL_TABLET | Freq: Every day | ORAL | 1 refills | Status: DC

## 2021-06-16 NOTE — Progress Notes (Signed)
    SUBJECTIVE:   CHIEF COMPLAINT / HPI:   55 yo female with PMH of HTN, T2DM, CKD and Acute respiratory failure with Hypoxia. She is presenting today for labs. Her last Lab was done March, 2022. Patient said she is feeling well overall and have been exercising 3-4 times a week for 30 mins. She has lost 2 lbs this month and is looking to continue working on life style changes. She reports being complaint with her medication and denies any adverse effect.    PERTINENT  PMH / PSH:  Past Medical History:  Diagnosis Date   Acute peritonitis ([place]) 12/20/2012   Acute respiratory failure with hypoxia ([place]) 12/20/2012   Chronic kidney disease    Diabetes mellitus without complication ([place])    H/O hiatal hernia    Hypertension    Septic shock(785.52) 12/20/2012   Severe sepsis(995.92) 12/20/2012   Small bowel obstruction ([place]) 12/20/2012    Review of Systems  Constitutional:  Negative for chills, fever and malaise/fatigue.  Respiratory:  Negative for cough and shortness of breath.   Cardiovascular:  Negative for chest pain, palpitations and leg swelling.  Gastrointestinal:  Negative for abdominal pain, nausea and vomiting.    OBJECTIVE:  Blood pressure 130/64, pulse 95, height '5\' 7"'$  (1.702 m), weight 281 lb (127.5 kg), SpO2 99 %.   Physical Exam General: Alert, Pleasant, Well developed  Cardiovascular: RRR, Normal S1/S2 Respiratory: CTAB, non labored breathing on room air Abdominal:Soft, no tenderness Extremities: No edema on extremities  Skin: dry and warm   ASSESSMENT/PLAN:   Patient with well controlled BP and improved A1c, 10.6> 8.6.  -Encouraged patient to continue her exercise routine and compliance to medication and diet. -Recommend follow up in 3 months  Hypercalcemia Patient last Calcium level in March 2022 was 10.8. She is currently on HCTZ.  - Ordered BMP today, will follow up on the lab result with patient. -If Calcium is still elevated in the new lab I would recommend  discontinue HCTZ to determine source of the hypercalcemia.   Colonoscopy 55 yo female is age appropriate for a colonoscopy screening. She missed her last scheduled appointment -Provided patient with the phone number for the gastroenterologist to call and reschedule her appointment   Alen Bleacher, MD [place]

## 2021-06-16 NOTE — Patient Instructions (Signed)
It was a pleasure meeting you! Thank you for allowing me to participate in your care!  I recommend that you always bring your medications to each appointment as this makes it easy to ensure we are on the correct medications and helps [place] not miss when refills are needed.  Our plans for today:   We will be drawing some labs today to check your (BMP) Hemoglobins, electrolyte, Liver enzymes and A1c.  You are to come back in 3 months for a follow up on labs (A1c) and your medications.  I recommend you Call [facility]  to reschedule your colonoscopy. Their office number is [phone]  We are checking some labs today, I will call you if they are abnormal or will send you a MyChart message or a letter if they are normal.  If you do not hear about your labs in the next 2 weeks please let us know.  Take care and seek immediate care sooner if you develop any concerns.   Dr. Alen Bleacher MD [facility]

## 2021-06-17 LAB — CBC
Hematocrit: 38.5 % (ref 34.0–46.6)
Hemoglobin: 12.8 g/dL (ref 11.1–15.9)
MCH: 28.1 pg (ref 26.6–33.0)
MCHC: 33.2 g/dL (ref 31.5–35.7)
MCV: 84 fL (ref 79–97)
Platelets: 287 10*3/uL (ref 150–450)
RBC: 4.56 x10E6/uL (ref 3.77–5.28)
RDW: 13.3 % (ref 11.7–15.4)
WBC: 9.3 10*3/uL (ref 3.4–10.8)

## 2021-06-17 LAB — BASIC METABOLIC PANEL
BUN/Creatinine Ratio: 28 — ABNORMAL HIGH (ref 9–23)
BUN: 39 mg/dL — ABNORMAL HIGH (ref 6–24)
CO2: 20 mmol/L (ref 20–29)
Calcium: 10.5 mg/dL — ABNORMAL HIGH (ref 8.7–10.2)
Chloride: 102 mmol/L (ref 96–106)
Creatinine, Ser: 1.4 mg/dL — ABNORMAL HIGH (ref 0.57–1.00)
Glucose: 116 mg/dL — ABNORMAL HIGH (ref 65–99)
Potassium: 4.3 mmol/L (ref 3.5–5.2)
Sodium: 139 mmol/L (ref 134–144)
eGFR: 44 mL/min/{1.73_m2} — ABNORMAL LOW (ref >59)

## 2021-06-20 ENCOUNTER — Telehealth: Payer: Self-pay | Admitting: Student

## 2021-06-20 NOTE — Telephone Encounter (Signed)
Called Erin Schultz on 06/20/21 at 11:05am inform her that her labs came back and showed slightly elevated calcium level at 10.5.  Advised patient to stop her Maxzide medication since it contains HCTZ which could possibly be the cause of her elevated calcium levels.  Patient is to stop this medication and follow-up in a month for a repeat lab to determine if further work up is needed.

## 2021-06-23 ENCOUNTER — Other Ambulatory Visit: Payer: Self-pay | Admitting: Family Medicine

## 2021-06-23 ENCOUNTER — Other Ambulatory Visit: Payer: Self-pay | Admitting: Student

## 2021-06-23 DIAGNOSIS — E785 Hyperlipidemia, unspecified: Secondary | ICD-10-CM

## 2021-07-20 ENCOUNTER — Encounter: Payer: Self-pay | Admitting: Student

## 2021-07-20 ENCOUNTER — Ambulatory Visit: Payer: BC Managed Care – PPO | Admitting: Student

## 2021-07-20 ENCOUNTER — Other Ambulatory Visit: Payer: Self-pay

## 2021-07-20 DIAGNOSIS — I10 Essential (primary) hypertension: Secondary | ICD-10-CM

## 2021-07-20 DIAGNOSIS — E1165 Type 2 diabetes mellitus with hyperglycemia: Secondary | ICD-10-CM | POA: Diagnosis not present

## 2021-07-20 MED ORDER — LOSARTAN POTASSIUM 50 MG PO TABS: 50.0000 mg | ORAL_TABLET | Freq: Every day | ORAL | 3 refills | Status: DC

## 2021-07-20 NOTE — Progress Notes (Signed)
    SUBJECTIVE:   CHIEF COMPLAINT / HPI:   Erin Schultz is a 55 yo female with Hx of T2DM, HTN and HLD presenting today for follow up on hypercalcemia. In her last visit, patient was found to have hypercalcemia and advised to discontinue HTCZ which she was taking for hypertension. Patient said she is doing well overall and her only concern is new LE swelling that started shortly after she stopped taking her HCTZ. She usually notice the swelling when she wakes up in the morning or after work. She sits for 8 hours at work. She denies having SOB, LE pain or tenderness associated with the swelling.  PERTINENT  PMH / PSH:  Past Medical History:  Diagnosis Date   Acute peritonitis ([place]) 12/20/2012   Acute respiratory failure with hypoxia ([place]) 12/20/2012   Chronic kidney disease    Diabetes mellitus without complication ([place])    H/O hiatal hernia    Hypertension    Septic shock(785.52) 12/20/2012   Severe sepsis(995.92) 12/20/2012   Small bowel obstruction ([place]) 12/20/2012   Review of Systems  Constitutional:  Negative for chills, fever, malaise/fatigue and weight loss.  Respiratory:  Negative for cough, hemoptysis and shortness of breath.   Cardiovascular:  Positive for leg swelling. Negative for chest pain, palpitations and PND.  Gastrointestinal:  Negative for abdominal pain, constipation, nausea and vomiting.    OBJECTIVE:   BP 131/68   Pulse 97   Wt 129.9 kg   SpO2 97%   BMI 44.85 kg/m    Physical Exam General: Alert, well appearing, NAD, Oriented x4 Cardiovascular: RRR, No Murmurs, Normal S2/S2 Respiratory: CTAB, No wheezing or Rales Abdomen: Soft, No distension or tenderness Extremities: +1 LE edema   Skin: Warm and dry  ASSESSMENT/PLAN:   Primary hypertension Patient BP is well controlled with Amlodipine. BP today is 131/68. She reports new onset bilateral  LE edema which started after she stopped HCTZ due to concerns for elevated calcium levels. Will switch patient's BP  medication to Losartan considering her history of T2DM and less side effect  - Started pt on Losartan '50mg'$  daily -Benefit and side effect discussed with patient -Discontinued  amlodipine and HCTZ   Hypercalcemia HCTZ for blood pressure was recently discontinued due to elevated calcium levels. Patient arrived at the clinic to check calcium levels after a month of discontinuing HCTZ. She denies having muscle cramps or weakness, loss of appetite or increased thirst. -Ordered BMP; creatinine and Ca -Follow up in 2 weeks for labs and response to medication   Poorly controlled diabetes mellitus ([place]) Patient with hx of poorly controlled diabetes is overdue for annual foot exam. Last A1c is 8.6 -ordered podiatrist referral     Erin Bleacher, MD [place]

## 2021-07-20 NOTE — Assessment & Plan Note (Addendum)
HCTZ for blood pressure was recently discontinued due to elevated calcium levels. Patient arrived at the clinic to check calcium levels after a month of discontinuing HCTZ. She denies having muscle cramps or weakness, loss of appetite or increased thirst. -Ordered BMP; creatinine and Ca -Follow up in 2 weeks for labs and response to medication

## 2021-07-20 NOTE — Assessment & Plan Note (Signed)
Patient BP is well controlled with Amlodipine. BP today is 131/68. She reports new onset bilateral  LE edema which started after she stopped HCTZ due to concerns for elevated calcium levels. Will switch patient's BP medication to Losartan considering her history of T2DM and less side effect  - Started pt on Losartan '50mg'$  daily -Benefit and side effect discussed with patient -Discontinued  amlodipine and HCTZ

## 2021-07-20 NOTE — Assessment & Plan Note (Addendum)
Patient with hx of poorly controlled diabetes is overdue for annual foot exam. Last A1c is 8.6 -ordered podiatrist referral

## 2021-07-20 NOTE — Patient Instructions (Addendum)
It was wonderful to see you today. Thank you for allowing me to be a part of your care. Below is a short summary of what we discussed at your visit today:  Your new swelling on both leg is most likely because of the Amlodipine which you take for blood pressure.  We will switch your BP medication to Losartan. It is safe for the kidney, good on diabetics and also won't cause high calcium.   We are doing a BMP lab today to check your calcium and creatinine level  Put in referral for podiatrist for your feet exam.   If you have any questions or concerns, please do not hesitate to contact us via phone or MyChart message.   Alen Bleacher, MD [facility]

## 2021-07-21 ENCOUNTER — Other Ambulatory Visit: Payer: Self-pay | Admitting: Family Medicine

## 2021-07-21 DIAGNOSIS — E1165 Type 2 diabetes mellitus with hyperglycemia: Secondary | ICD-10-CM

## 2021-07-21 LAB — BASIC METABOLIC PANEL
BUN/Creatinine Ratio: 18 (ref 9–23)
BUN: 20 mg/dL (ref 6–24)
CO2: 22 mmol/L (ref 20–29)
Calcium: 10.3 mg/dL — ABNORMAL HIGH (ref 8.7–10.2)
Chloride: 103 mmol/L (ref 96–106)
Creatinine, Ser: 1.1 mg/dL — ABNORMAL HIGH (ref 0.57–1.00)
Glucose: 161 mg/dL — ABNORMAL HIGH (ref 65–99)
Potassium: 4 mmol/L (ref 3.5–5.2)
Sodium: 142 mmol/L (ref 134–144)
eGFR: 59 mL/min/{1.73_m2} — ABNORMAL LOW (ref >59)

## 2021-07-26 ENCOUNTER — Telehealth: Payer: Self-pay | Admitting: Student

## 2021-07-26 NOTE — Telephone Encounter (Signed)
Called patient today to discuss her recent lab.  Inform patient that her elevated calcium did decrease after discontinuing the HCTZ.  Her calcium is borderline elevated and at this time there is no need for further work-up.  Also the creatinine levels improved. It is borderline elevated but trending downwards.

## 2021-08-14 ENCOUNTER — Other Ambulatory Visit: Payer: Self-pay | Admitting: Family Medicine

## 2021-08-14 DIAGNOSIS — E1165 Type 2 diabetes mellitus with hyperglycemia: Secondary | ICD-10-CM

## 2021-08-14 DIAGNOSIS — I10 Essential (primary) hypertension: Secondary | ICD-10-CM

## 2021-08-18 ENCOUNTER — Encounter: Payer: Self-pay | Admitting: Student

## 2021-08-18 ENCOUNTER — Other Ambulatory Visit: Payer: Self-pay

## 2021-08-18 ENCOUNTER — Ambulatory Visit (INDEPENDENT_AMBULATORY_CARE_PROVIDER_SITE_OTHER): Payer: BC Managed Care – PPO | Admitting: Student

## 2021-08-18 VITALS — BP 146/72 | HR 92 | Ht 67.0 in | Wt 289.4 lb

## 2021-08-18 DIAGNOSIS — I10 Essential (primary) hypertension: Secondary | ICD-10-CM

## 2021-08-18 DIAGNOSIS — E785 Hyperlipidemia, unspecified: Secondary | ICD-10-CM

## 2021-08-18 DIAGNOSIS — E1165 Type 2 diabetes mellitus with hyperglycemia: Secondary | ICD-10-CM | POA: Diagnosis not present

## 2021-08-18 MED ORDER — LOSARTAN POTASSIUM 100 MG PO TABS: 100.0000 mg | ORAL_TABLET | Freq: Every day | ORAL | 3 refills | Status: DC

## 2021-08-18 MED ORDER — OZEMPIC (1 MG/DOSE) 4 MG/3ML ~~LOC~~ SOPN: 1.5000 mg | PEN_INJECTOR | SUBCUTANEOUS | 2 refills | Status: DC

## 2021-08-18 NOTE — Patient Instructions (Signed)
It was wonderful to see you today. Thank you for allowing me to be a part of your care. Below is a short summary of what we discussed at your visit today:  Your BP is slightly elevated today than the past. Will go up on your losartan to 100 mg daily.  Also keep a diary of your blood pressure.  Will go up to see Ozempic to 1.5 mg weekly.  Primary referral with the [facility] no training program.  BP follow-up in 4 weeks and we can do a lipid panel at this time.  Please bring all of your medications to every appointment!  If you have any questions or concerns, please do not hesitate to contact us via phone or MyChart message.   Alen Bleacher, MD [facility]

## 2021-08-18 NOTE — Assessment & Plan Note (Signed)
Patient's BP today was 146/72.  She was recently started on losartan 50 mg about 4 weeks ago.  Her blood pressure seems to be slightly elevated this visit and will most likely benefit from increasing her losartan to 100 mg daily.  She she denies having any blurry vision, dizziness, muscle cramps or weakness. -Increased losartan to 100 mg daily -Recommend keeping blood pressure diary until next visit. -Follow-up in 4 weeks.

## 2021-08-18 NOTE — Assessment & Plan Note (Addendum)
Patient's last lipid panel was February 04, 2021.  With cholesterol 236 and LDL 154.  Recommended lipid panel today however patient declined due to concerns that she had pizza for lunch today.  Patient will like to do her lipid panel on her next visit in 4 weeks. -Lipid panel to be done in 4 weeks at her next appointment. -Pending results will consider switching to a more potent Vascepa 2g BID

## 2021-08-18 NOTE — Assessment & Plan Note (Signed)
Her weight today was 286lb 6.4 oz. She gained about 3 pounds from her last visit.  Patient expressed interest in improving blood glucose control and weight loss.  She is interested in increasing her Ozempic to 1.5 mg daily -Increase patient's Ozempic to 1.5 mg daily -Referral to [place] program

## 2021-08-18 NOTE — Progress Notes (Signed)
    SUBJECTIVE:   CHIEF COMPLAINT / HPI:  Ms. Erin Schultz is a 55 year old female who presented today for blood pressure follow-up on new medication.  PMH is significant for hypertension, T2DM, and hyperlipidemia.  Patient was started on losartan 50 mg daily after stopping amlodipine 4 weeks ago due to side effect of bilateral edema.  She says she has been doing well on losartan and denies having any LE edema, weaknesses, or muscle cramps. She does not check her BP at home. She says she feels well overall has no complaints.  PERTINENT  PMH / PSH:  Past Medical History:  Diagnosis Date   Acute peritonitis ([place]) 12/20/2012   Acute respiratory failure with hypoxia ([place]) 12/20/2012   Chronic kidney disease    Diabetes mellitus without complication ([place])    H/O hiatal hernia    Hypertension    Septic shock(785.52) 12/20/2012   Severe sepsis(995.92) 12/20/2012   Small bowel obstruction ([place]) 12/20/2012   Review of Systems  Constitutional:  Negative for chills, fever and weight loss.  Eyes:  Negative for blurred vision, double vision and pain.  Respiratory:  Negative for cough, hemoptysis and shortness of breath.   Cardiovascular:  Negative for chest pain, palpitations and leg swelling.  Gastrointestinal:  Negative for constipation, diarrhea, heartburn, nausea and vomiting.    OBJECTIVE:   BP (!) 146/72   Pulse 92   Ht 5\' 7"  (1.702 m)   Wt 289 lb 6.4 oz (131.3 kg)   SpO2 100%   BMI 45.33 kg/m    Physical Exam General: Alert, well appearing, NAD, Oriented x4 Cardiovascular: RRR, No Murmurs, Normal S2/S2 Respiratory: CTAB, No wheezing or Rales Abdomen: No distension or tenderness Extremities: No edema on extremities   Skin: Warm and dry  ASSESSMENT/PLAN:   Primary hypertension Patient's BP today was 146/72.  She was recently started on losartan 50 mg about 4 weeks ago.  Her blood pressure seems to be slightly elevated this visit and will most likely benefit from increasing her  losartan to 100 mg daily.  She she denies having any blurry vision, dizziness, muscle cramps or weakness. -Increased losartan to 100 mg daily -Recommend keeping blood pressure diary until next visit. -Follow-up in 4 weeks.  Poorly controlled diabetes mellitus ([place]) Her weight today was 286lb 6.4 oz. She gained about 3 pounds from her last visit.  Patient expressed interest in improving blood glucose control and weight loss.  She is interested in increasing her Ozempic to 1.5 mg daily -Increase patient's Ozempic to 1.5 mg daily -Referral to [facility] PREP program    Hyperlipidemia Patient's last lipid panel was February 04, 2021.  With cholesterol 236 and LDL 154.  Recommended lipid panel today however patient declined due to concerns that she had pizza for lunch today.  Patient will like to do her lipid panel on her next visit in 4 weeks. -Lipid panel to be done in 4 weeks at her next appointment. -Pending results will consider switching to a more potent Vascepa 2g BID   Alen Bleacher, MD [place]

## 2021-08-23 ENCOUNTER — Other Ambulatory Visit: Payer: Self-pay | Admitting: Student

## 2021-08-23 DIAGNOSIS — E785 Hyperlipidemia, unspecified: Secondary | ICD-10-CM

## 2021-09-19 ENCOUNTER — Encounter: Payer: Self-pay | Admitting: Gastroenterology

## 2021-09-22 ENCOUNTER — Other Ambulatory Visit: Payer: Self-pay | Admitting: Student

## 2021-09-22 DIAGNOSIS — E1165 Type 2 diabetes mellitus with hyperglycemia: Secondary | ICD-10-CM

## 2021-10-22 ENCOUNTER — Other Ambulatory Visit: Payer: Self-pay | Admitting: Student

## 2021-10-22 DIAGNOSIS — E1165 Type 2 diabetes mellitus with hyperglycemia: Secondary | ICD-10-CM

## 2021-11-01 ENCOUNTER — Ambulatory Visit (AMBULATORY_SURGERY_CENTER): Payer: BC Managed Care – PPO | Admitting: *Deleted

## 2021-11-01 ENCOUNTER — Other Ambulatory Visit: Payer: Self-pay

## 2021-11-01 VITALS — Ht 67.0 in | Wt 281.0 lb

## 2021-11-01 DIAGNOSIS — Z1211 Encounter for screening for malignant neoplasm of colon: Secondary | ICD-10-CM

## 2021-11-01 MED ORDER — NA SULFATE-K SULFATE-MG SULF 17.5-3.13-1.6 GM/177ML PO SOLN: 1.0000 | Freq: Once | ORAL | 0 refills | Status: AC

## 2021-11-01 NOTE — Progress Notes (Signed)
Virtual pre visit completed over telephone.  Instructions mailed to patient. No egg or soy allergy known to patient  No issues known to pt with past sedation with any surgeries or procedures Patient denies ever being told they had issues or difficulty with intubation  No FH of Malignant Hyperthermia Pt is not on diet pills Pt is not on  home 02  Pt is not on blood thinners  Pt denies issues with constipation  No A fib or A flutter Discussed with pt there will be an out-of-pocket cost for prep and that varies from $0 to 70 +  dollars - pt verbalized understanding   Due to the COVID-19 pandemic we are asking patients to follow certain guidelines in [place] and the [place]   Pt aware of COVID protocols and [facility] guidelines

## 2021-11-07 ENCOUNTER — Telehealth: Payer: Self-pay | Admitting: Gastroenterology

## 2021-11-07 DIAGNOSIS — Z1211 Encounter for screening for malignant neoplasm of colon: Secondary | ICD-10-CM

## 2021-11-07 MED ORDER — PEG 3350-KCL-NA BICARB-NACL 420 G PO SOLR: 4000.0000 mL | Freq: Once | ORAL | 0 refills | Status: AC

## 2021-11-07 NOTE — Telephone Encounter (Signed)
Spoke with pt- offered Plenvu with PNM than $50 coupon or Golytely.  Pt chose Golytely.  Rx sent to pharmacy and new instructions sent via [place]

## 2021-11-07 NOTE — Telephone Encounter (Signed)
Inbound call from patient states Suprep is not covered by insurance for upcoming procedure 11/16/2021

## 2021-11-14 ENCOUNTER — Encounter: Payer: Self-pay | Admitting: Gastroenterology

## 2021-11-15 NOTE — Telephone Encounter (Signed)
Spoke with pt. She stated that she was given both the golytely prep and suprep. Aked pt which prep she wanted to do, and she chose the Suprep. Went over instructions on how to take prep this evening and tomorrow. Pt verbalized understanding and had no further concerns at the end of the call.

## 2021-11-15 NOTE — Telephone Encounter (Signed)
Inbound call from patient requesting a call back please.  States 2 brown bottles came with the prep solution and is asking if everything needs to be mixed together.

## 2021-11-16 ENCOUNTER — Encounter: Payer: Self-pay | Admitting: Gastroenterology

## 2021-11-16 ENCOUNTER — Other Ambulatory Visit: Payer: Self-pay

## 2021-11-16 ENCOUNTER — Ambulatory Visit (AMBULATORY_SURGERY_CENTER): Payer: BC Managed Care – PPO | Admitting: Gastroenterology

## 2021-11-16 VITALS — BP 147/83 | HR 74 | Temp 97.8°F | Resp 11 | Ht 67.0 in | Wt 281.0 lb

## 2021-11-16 DIAGNOSIS — Z1211 Encounter for screening for malignant neoplasm of colon: Secondary | ICD-10-CM

## 2021-11-16 DIAGNOSIS — D122 Benign neoplasm of ascending colon: Secondary | ICD-10-CM | POA: Diagnosis not present

## 2021-11-16 MED ORDER — SODIUM CHLORIDE 0.9 % IV SOLN: 500.0000 mL | Freq: Once | INTRAVENOUS | Status: DC

## 2021-11-16 NOTE — Progress Notes (Signed)
Called to room to assist during endoscopic procedure.  Patient ID and intended procedure confirmed with present staff. Received instructions for my participation in the procedure from the performing physician.  

## 2021-11-16 NOTE — Progress Notes (Signed)
VS by CW  Pt's states no medical or surgical changes since previsit or office visit.  

## 2021-11-16 NOTE — Patient Instructions (Signed)
1 polyp removed- await pathology  Please read over handouts about polyps and diverticulosis  Continue your normal medications   YOU HAD AN ENDOSCOPIC PROCEDURE TODAY AT [facility]:   Refer to the procedure report that was given to you for any specific questions about what was found during the examination.  If the procedure report does not answer your questions, please call your gastroenterologist to clarify.  If you requested that your care partner not be given the details of your procedure findings, then the procedure report has been included in a sealed envelope for you to review at your convenience later.  YOU SHOULD EXPECT: Some feelings of bloating in the abdomen. Passage of more gas than usual.  Walking can help get rid of the air that was put into your GI tract during the procedure and reduce the bloating. If you had a lower endoscopy (such as a colonoscopy or flexible sigmoidoscopy) you may notice spotting of blood in your stool or on the toilet paper. If you underwent a bowel prep for your procedure, you may not have a normal bowel movement for a few days.  Please Note:  You might notice some irritation and congestion in your nose or some drainage.  This is from the oxygen used during your procedure.  There is no need for concern and it should clear up in a day or so.  SYMPTOMS TO REPORT IMMEDIATELY:  Following lower endoscopy (colonoscopy or flexible sigmoidoscopy):  Excessive amounts of blood in the stool  Significant tenderness or worsening of abdominal pains  Swelling of the abdomen that is new, acute  Fever of 100F or higher  For urgent or emergent issues, a gastroenterologist can be reached at any hour by calling [phone]. Do not use MyChart messaging for urgent concerns.    DIET:  We do recommend a small meal at first, but then you may proceed to your regular diet.  Drink plenty of fluids but you should avoid alcoholic beverages for 24  hours.  ACTIVITY:  You should plan to take it easy for the rest of today and you should NOT DRIVE or use heavy machinery until tomorrow (because of the sedation medicines used during the test).    FOLLOW UP: Our staff will call the number listed on your records 48-72 hours following your procedure to check on you and address any questions or concerns that you may have regarding the information given to you following your procedure. If we do not reach you, we will leave a message.  We will attempt to reach you two times.  During this call, we will ask if you have developed any symptoms of COVID 19. If you develop any symptoms (ie: fever, flu-like symptoms, shortness of breath, cough etc.) before then, please call [phone].  If you test positive for Covid 19 in the 2 weeks post procedure, please call and report this information to [place].    If any biopsies were taken you will be contacted by phone or by letter within the next 1-3 weeks.  Please call us at [phone] if you have not heard about the biopsies in 3 weeks.    SIGNATURES/CONFIDENTIALITY: You and/or your care partner have signed paperwork which will be entered into your electronic medical record.  These signatures attest to the fact that that the information above on your After Visit Summary has been reviewed and is understood.  Full responsibility of the confidentiality of this discharge information lies with you and/or your  care-partner.

## 2021-11-16 NOTE — Progress Notes (Signed)
To pacu, VSS. Report to [email or web address] 

## 2021-11-16 NOTE — Progress Notes (Signed)
History and Physical:  This patient presents for endoscopic testing for: Encounter Diagnosis  Name Primary?   Special screening for malignant neoplasms, colon Yes    Average risk - first screening colonoscopy Patient denies chronic abdominal pain, rectal bleeding, constipation or diarrhea.   ROS: Patient denies chest pain or cough   Past Medical History: Past Medical History:  Diagnosis Date   Acute peritonitis ([place]) 12/20/2012   Acute respiratory failure with hypoxia ([place]) 12/20/2012   Chronic kidney disease    Diabetes mellitus without complication ([place])    H/O hiatal hernia    Hypertension    Septic shock(785.52) 12/20/2012   Severe sepsis(995.92) 12/20/2012   Small bowel obstruction ([place]) 12/20/2012     Past Surgical History: Past Surgical History:  Procedure Laterality Date   ABDOMINAL HYSTERECTOMY     APPENDECTOMY     CHOLECYSTECTOMY     LAPAROTOMY  12/15/2012   Procedure: EXPLORATORY LAPAROTOMY;  Surgeon: Stark Klein, MD;  Location: [place];  Service: General;  Laterality: N/A;   LAPAROTOMY N/A 12/19/2012   Procedure: EXPLORATORY LAPAROTOMY;  Surgeon: Adin Hector, MD;  Location: [place];  Service: General;  Laterality: N/A;   VACUUM ASSISTED CLOSURE CHANGE  12/17/2012   Procedure: ABDOMINAL VACUUM ASSISTED CLOSURE CHANGE;  Surgeon: Adin Hector, MD;  Location: [place];  Service: General;  Laterality: N/A;  EX Lap with vac change   VACUUM ASSISTED CLOSURE CHANGE N/A 12/19/2012   Procedure: ABDOMINAL VACUUM ASSISTED CLOSURE CHANGE;  Surgeon: Adin Hector, MD;  Location: [place];  Service: General;  Laterality: N/A;   VACUUM ASSISTED CLOSURE CHANGE N/A 12/22/2012   Procedure: ABDOMINAL VACUUM ASSISTED CLOSURE CHANGE;  Surgeon: Shann Medal, MD;  Location: [place];  Service: General;  Laterality: N/A;   VACUUM ASSISTED CLOSURE CHANGE N/A 12/24/2012   Procedure: ABDOMINAL VACUUM ASSISTED CLOSURE CHANGE;  Surgeon: Shann Medal, MD;  Location: [place];  Service: General;  Laterality:  N/A;    Allergies: Allergies  Allergen Reactions   Corn-Containing Products Nausea And Vomiting    Stomach inflamed   Sulfa Antibiotics Other (See Comments)    Childhood reaction unsure - possibly rash, denies breathing issues    Outpatient Meds: Current Outpatient Medications  Medication Sig Dispense Refill   Blood Glucose Monitoring Suppl (ONE TOUCH ULTRA 2) w/Device KIT USE AS DIRECTED 1 kit 0   glucose blood (ONE TOUCH ULTRA TEST) test strip Use three times daily before breakfast and after meals. 100 each 2   JARDIANCE 25 MG TABS tablet TAKE 1 TABLET (25 MG TOTAL) BY MOUTH DAILY. 30 tablet 5   losartan (COZAAR) 100 MG tablet Take 1 tablet (100 mg total) by mouth at bedtime. 90 tablet 3   metFORMIN (GLUCOPHAGE) 1000 MG tablet TAKE 1 TABLET TWICE A DAY WITH A MEAL 60 tablet 1   Multiple Vitamins-Minerals (MULTI-VITAMIN GUMMIES) CHEW Chew by mouth.     ONETOUCH DELICA LANCETS FINE MISC 1 application by Does not apply route 3 (three) times daily. 100 each 12   OZEMPIC, 1 MG/DOSE, 4 MG/3ML SOPN INJECT 1 MG INTO THE SKIN ONCE A WEEK. 3 mL 6   pravastatin (PRAVACHOL) 40 MG tablet TAKE 1 TABLET BY MOUTH EVERYDAY AT BEDTIME 30 tablet 2   TURMERIC PO Take 1 capsule by mouth daily.     UNIFINE PENTIPS PLUS 31G X 6 MM MISC USE AS DIRECTED 100 each 0   desloratadine (CLARINEX) 5 MG tablet Take 1 tablet (5 mg total) by mouth  daily. 30 tablet 1   Semaglutide, 1 MG/DOSE, (OZEMPIC, 1 MG/DOSE,) 4 MG/3ML SOPN Inject 1.5 mg into the skin once a week. (Patient not taking: Reported on 11/16/2021) 6 mL 2   Current Facility-Administered Medications  Medication Dose Route Frequency Provider Last Rate Last Admin   0.9 %  sodium chloride infusion  500 mL Intravenous Once Doran Stabler, MD          ___________________________________________________________________ Objective   Exam:  BP (!) 152/65    Pulse 86    Temp 97.8 F (36.6 C)    Ht $R'5\' 7"'bt$  (1.702 m)    Wt 281 lb (127.5 kg)    SpO2 99%     BMI 44.01 kg/m   CV: RRR without murmur, S1/S2 Resp: clear to auscultation bilaterally, normal RR and effort noted GI: soft, no tenderness, with active bowel sounds.   Assessment: Encounter Diagnosis  Name Primary?   Special screening for malignant neoplasms, colon Yes     Plan: Colonoscopy    The patient is appropriate for an endoscopic procedure in the ambulatory setting.   - Wilfrid Lund, MD

## 2021-11-16 NOTE — Op Note (Signed)
[facility] Patient Name: Erin Schultz Procedure Date: 11/16/2021 8:26 AM MRN: [id number] Endoscopist: Veblen. Loletha Carrow , MD Age: 56 Referring MD:  Date of Birth: 1965/11/30 Gender: Female Account #: [id number] Procedure:                Colonoscopy Indications:              Screening for colorectal malignant neoplasm, This                            is the patient's first colonoscopy Medicines:                Monitored Anesthesia Care Procedure:                Pre-Anesthesia Assessment:                           - Prior to the procedure, a History and Physical                            was performed, and patient medications and                            allergies were reviewed. The patient's tolerance of                            previous anesthesia was also reviewed. The risks                            and benefits of the procedure and the sedation                            options and risks were discussed with the patient.                            All questions were answered, and informed consent                            was obtained. Prior Anticoagulants: The patient has                            taken no previous anticoagulant or antiplatelet                            agents. ASA Grade Assessment: III - A patient with                            severe systemic disease. After reviewing the risks                            and benefits, the patient was deemed in                            satisfactory condition to undergo the procedure.  After obtaining informed consent, the colonoscope                            was passed under direct vision. Throughout the                            procedure, the patient's blood pressure, pulse, and                            oxygen saturations were monitored continuously. The                            Colonoscope was introduced through the anus and                            advanced to the the  cecum, identified by                            appendiceal orifice and ileocecal valve. The                            colonoscopy was performed without difficulty. The                            patient tolerated the procedure well. The quality                            of the bowel preparation was excellent. The                            ileocecal valve, appendiceal orifice, and rectum                            were photographed. The bowel preparation used was                            SUPREP. Scope In: 8:37:33 AM Scope Out: 8:55:37 AM Scope Withdrawal Time: 0 hours 12 minutes 42 seconds  Total Procedure Duration: 0 hours 18 minutes 4 seconds  Findings:                 The perianal and digital rectal examinations were                            normal.                           Repeat examination of right colon under NBI                            performed.                           A few diverticula were found in the left colon and  right colon.                           A 6 mm polyp was found in the proximal ascending                            colon. The polyp was semi-sessile. The polyp was                            removed with a cold snare. Resection and retrieval                            were complete.                           The exam was otherwise without abnormality on                            direct and retroflexion views. Complications:            No immediate complications. Estimated Blood Loss:     Estimated blood loss was minimal. Impression:               - Diverticulosis in the left colon and in the right                            colon.                           - One 6 mm polyp in the proximal ascending colon,                            removed with a cold snare. Resected and retrieved.                           - The examination was otherwise normal on direct                            and retroflexion views. Recommendation:            - Patient has a contact number available for                            emergencies. The signs and symptoms of potential                            delayed complications were discussed with the                            patient. Return to normal activities tomorrow.                            Written discharge instructions were provided to the                            patient.                           -  Resume previous diet.                           - Continue present medications.                           - Await pathology results.                           - Repeat colonoscopy is recommended for                            surveillance. The colonoscopy date will be                            determined after pathology results from today's                            exam become available for review. Erin January L. Loletha Carrow, MD 11/16/2021 9:00:20 AM This report has been signed electronically.

## 2021-11-20 ENCOUNTER — Encounter: Payer: Self-pay | Admitting: Gastroenterology

## 2021-11-20 ENCOUNTER — Telehealth: Payer: Self-pay

## 2021-11-20 NOTE — Telephone Encounter (Signed)
Patient returning call stating she is doing well.

## 2021-11-20 NOTE — Telephone Encounter (Signed)
Left message on follow up call. 

## 2021-11-22 ENCOUNTER — Other Ambulatory Visit: Payer: Self-pay | Admitting: Student

## 2021-11-22 DIAGNOSIS — E785 Hyperlipidemia, unspecified: Secondary | ICD-10-CM

## 2021-11-22 DIAGNOSIS — E1165 Type 2 diabetes mellitus with hyperglycemia: Secondary | ICD-10-CM

## 2021-12-08 ENCOUNTER — Other Ambulatory Visit: Payer: Self-pay | Admitting: Student

## 2021-12-08 DIAGNOSIS — Z1231 Encounter for screening mammogram for malignant neoplasm of breast: Secondary | ICD-10-CM

## 2022-01-03 ENCOUNTER — Other Ambulatory Visit: Payer: Self-pay | Admitting: Student

## 2022-01-17 ENCOUNTER — Ambulatory Visit: Payer: BC Managed Care – PPO

## 2022-01-17 ENCOUNTER — Ambulatory Visit
Admission: RE | Admit: 2022-01-17 | Discharge: 2022-01-17 | Disposition: A | Discharge status: Home / Self Care | Payer: BC Managed Care – PPO | Source: Ambulatory Visit | Attending: Family Medicine | Admitting: Family Medicine

## 2022-01-17 DIAGNOSIS — Z1231 Encounter for screening mammogram for malignant neoplasm of breast: Secondary | ICD-10-CM

## 2022-01-29 ENCOUNTER — Other Ambulatory Visit: Payer: Self-pay | Admitting: Student

## 2022-01-29 DIAGNOSIS — E1165 Type 2 diabetes mellitus with hyperglycemia: Secondary | ICD-10-CM

## 2022-02-15 ENCOUNTER — Other Ambulatory Visit: Payer: Self-pay | Admitting: Student

## 2022-02-15 ENCOUNTER — Ambulatory Visit: Payer: BC Managed Care – PPO | Admitting: Student

## 2022-02-15 DIAGNOSIS — E1165 Type 2 diabetes mellitus with hyperglycemia: Secondary | ICD-10-CM

## 2022-02-25 ENCOUNTER — Other Ambulatory Visit: Payer: Self-pay | Admitting: Student

## 2022-02-25 DIAGNOSIS — E1165 Type 2 diabetes mellitus with hyperglycemia: Secondary | ICD-10-CM

## 2022-02-25 DIAGNOSIS — E785 Hyperlipidemia, unspecified: Secondary | ICD-10-CM

## 2022-03-24 ENCOUNTER — Other Ambulatory Visit: Payer: Self-pay | Admitting: Family Medicine

## 2022-03-24 ENCOUNTER — Other Ambulatory Visit: Payer: Self-pay | Admitting: Student

## 2022-03-24 DIAGNOSIS — E785 Hyperlipidemia, unspecified: Secondary | ICD-10-CM

## 2022-03-25 ENCOUNTER — Other Ambulatory Visit: Payer: Self-pay | Admitting: Student

## 2022-03-25 DIAGNOSIS — E1165 Type 2 diabetes mellitus with hyperglycemia: Secondary | ICD-10-CM

## 2022-03-26 ENCOUNTER — Other Ambulatory Visit: Payer: Self-pay

## 2022-03-26 ENCOUNTER — Ambulatory Visit: Payer: BC Managed Care – PPO | Admitting: Student

## 2022-03-26 ENCOUNTER — Encounter: Payer: Self-pay | Admitting: Student

## 2022-03-26 VITALS — BP 144/92 | HR 96 | Ht 67.0 in | Wt 287.2 lb

## 2022-03-26 DIAGNOSIS — E1165 Type 2 diabetes mellitus with hyperglycemia: Secondary | ICD-10-CM | POA: Diagnosis not present

## 2022-03-26 DIAGNOSIS — I10 Essential (primary) hypertension: Secondary | ICD-10-CM

## 2022-03-26 LAB — POCT GLYCOSYLATED HEMOGLOBIN (HGB A1C): HbA1c, POC (controlled diabetic range): 8.3 % — AB (ref 0.0–7.0)

## 2022-03-26 MED ORDER — OZEMPIC (1 MG/DOSE) 4 MG/3ML ~~LOC~~ SOPN: 2.0000 mg | PEN_INJECTOR | SUBCUTANEOUS | 2 refills | Status: DC

## 2022-03-26 NOTE — Assessment & Plan Note (Signed)
BP today was 178/92 and on recheck was 144/92. Patirnt reports one-time home reading of 124/80.  She endorses compliance and tolerance with her home medication of losartan 100 mg daily.  Recommend patient keeps a blood pressure diary and to follow-up in 2 weeks to reassess blood pressure management. ?

## 2022-03-26 NOTE — Patient Instructions (Addendum)
It was wonderful to meet you today. Thank you for allowing me to be a part of your care. Below is a short summary of what we discussed at your visit today: ? ? ?Your A1c today improved to 8.3 ? ?We collected lab for lipid panel to check your cholesterol levels. ? ?Increased the Ozempic to 2 mg weekly. ? ?BP is elevated, I recommend keeping blood pressure diary and following up in 2 - 3 weeks.. ? ? ?If you have any questions or concerns, please do not hesitate to contact us via phone or MyChart message.  ? ?Alen Bleacher, MD ?[facility]  ?

## 2022-03-26 NOTE — Assessment & Plan Note (Addendum)
A1c today improved to 8.3.  Patient endorses compliance and tolerance to her Jardiance, Ozempic and metformin. ?-  Increase Ozempic to '2mg'$  weekly ?- Encouraged patient to continue diet and exercising.  ?-Obtain Lipid panel ?-Diabetic foot exam to be completed on next visit. ?

## 2022-03-26 NOTE — Progress Notes (Signed)
? ? ?  SUBJECTIVE:  ? ?CHIEF COMPLAINT / HPI: Diabetes follow up ? ?Diabetes ?Patient's current diabetic medications include Jardiance, Ozempic, and metformin. She is tolerating well without side effects.  Patient endorses compliance with these medications. Patient's last A1c was 8.6. ?Denies abdominal pain, blurred vision, polyuria, polydipsia, hypoglycemia. Patient states they understand that diet and exercise can help with her diabetes. ? ?Hypertension ?She has blood pressure today is elevated to 173/92.  On recheck blood pressure was 144/92.  Patient reports last time she checked it in April blood pressure was 124/80.  He is currently on losartan 100 mg daily and patient endorses taking her medication today and compliance with her medication.  Denies any headache change in vision or shortness of breath. ? ? ?PERTINENT  PMH / PSH: HTN, T2DM, morbid obesity, hyperlipidemia. ? ?OBJECTIVE:  ? ?BP (!) 144/92   Pulse 96   Ht '5\' 7"'$  (1.702 m)   Wt 287 lb 3.2 oz (130.3 kg)   SpO2 100%   BMI 44.98 kg/m?   ? ?Physical Exam ?General: Alert, well appearing, NAD, ?Cardiovascular: RRR, No Murmurs, Normal S2/S2 ?Respiratory: CTAB, No wheezing or Rales ?Extremities: No edema on extremities   ? ?ASSESSMENT/PLAN:  ? ?Primary hypertension ?BP today was 178/92 and on recheck was 144/92. Patirnt reports one-time home reading of 124/80.  She endorses compliance and tolerance with her home medication of losartan 100 mg daily.  Recommend patient keeps a blood pressure diary and to follow-up in 2 weeks to reassess blood pressure management. ? ?Poorly controlled diabetes mellitus ([place]) ?A1c today improved to 8.3.  Patient endorses compliance and tolerance to her Jardiance, Ozempic and metformin. ?-  Increase Ozempic to '2mg'$  weekly ?- Encouraged patient to continue diet and exercising.  ?-Obtain Lipid panel ?-Diabetic foot exam to be completed on next visit. ?  ? ? ?Alen Bleacher, MD ?[facility]  ? ? ?

## 2022-03-27 LAB — LIPID PANEL
Chol/HDL Ratio: 3.2 ratio (ref 0.0–4.4)
Cholesterol, Total: 161 mg/dL (ref 100–199)
HDL: 50 mg/dL (ref >39)
LDL Chol Calc (NIH): 89 mg/dL (ref 0–99)
Triglycerides: 125 mg/dL (ref 0–149)
VLDL Cholesterol Cal: 22 mg/dL (ref 5–40)

## 2022-04-17 ENCOUNTER — Encounter: Payer: Self-pay | Admitting: *Deleted

## 2022-04-23 ENCOUNTER — Other Ambulatory Visit: Payer: Self-pay | Admitting: Student

## 2022-04-23 DIAGNOSIS — E1165 Type 2 diabetes mellitus with hyperglycemia: Secondary | ICD-10-CM

## 2022-05-21 ENCOUNTER — Other Ambulatory Visit: Payer: Self-pay | Admitting: Student

## 2022-05-21 DIAGNOSIS — E1165 Type 2 diabetes mellitus with hyperglycemia: Secondary | ICD-10-CM

## 2022-05-25 ENCOUNTER — Other Ambulatory Visit: Payer: Self-pay | Admitting: Student

## 2022-05-25 DIAGNOSIS — E1165 Type 2 diabetes mellitus with hyperglycemia: Secondary | ICD-10-CM

## 2022-07-08 ENCOUNTER — Other Ambulatory Visit: Payer: Self-pay | Admitting: Student

## 2022-07-08 DIAGNOSIS — E1165 Type 2 diabetes mellitus with hyperglycemia: Secondary | ICD-10-CM

## 2022-07-08 DIAGNOSIS — I10 Essential (primary) hypertension: Secondary | ICD-10-CM

## 2022-07-22 ENCOUNTER — Other Ambulatory Visit: Payer: Self-pay | Admitting: Student

## 2022-07-22 DIAGNOSIS — E1165 Type 2 diabetes mellitus with hyperglycemia: Secondary | ICD-10-CM

## 2022-08-10 ENCOUNTER — Other Ambulatory Visit: Payer: Self-pay | Admitting: Student

## 2022-08-10 DIAGNOSIS — E1165 Type 2 diabetes mellitus with hyperglycemia: Secondary | ICD-10-CM

## 2022-08-13 ENCOUNTER — Other Ambulatory Visit: Payer: Self-pay | Admitting: Student

## 2022-08-13 DIAGNOSIS — E785 Hyperlipidemia, unspecified: Secondary | ICD-10-CM

## 2022-09-01 ENCOUNTER — Other Ambulatory Visit: Payer: Self-pay | Admitting: Student

## 2022-09-01 DIAGNOSIS — E1165 Type 2 diabetes mellitus with hyperglycemia: Secondary | ICD-10-CM

## 2022-09-03 ENCOUNTER — Ambulatory Visit (INDEPENDENT_AMBULATORY_CARE_PROVIDER_SITE_OTHER): Payer: BC Managed Care – PPO | Admitting: Family Medicine

## 2022-09-03 VITALS — BP 140/72 | HR 98 | Wt 283.0 lb

## 2022-09-03 DIAGNOSIS — N898 Other specified noninflammatory disorders of vagina: Secondary | ICD-10-CM

## 2022-09-03 DIAGNOSIS — E1165 Type 2 diabetes mellitus with hyperglycemia: Secondary | ICD-10-CM

## 2022-09-03 DIAGNOSIS — Z23 Encounter for immunization: Secondary | ICD-10-CM

## 2022-09-03 LAB — POCT WET PREP (WET MOUNT)
Clue Cells Wet Prep Whiff POC: NEGATIVE
Trichomonas Wet Prep HPF POC: ABSENT

## 2022-09-03 LAB — POCT GLYCOSYLATED HEMOGLOBIN (HGB A1C): HbA1c, POC (controlled diabetic range): 8.8 % — AB (ref 0.0–7.0)

## 2022-09-03 MED ORDER — FLUCONAZOLE 150 MG PO TABS: 150.0000 mg | ORAL_TABLET | Freq: Once | ORAL | 0 refills | Status: AC

## 2022-09-03 MED ORDER — OZEMPIC (2 MG/DOSE) 8 MG/3ML ~~LOC~~ SOPN: 2.0000 mg | PEN_INJECTOR | SUBCUTANEOUS | 2 refills | Status: DC

## 2022-09-03 NOTE — Patient Instructions (Addendum)
It was great to see you!  -I have sent a medication for a yeast infection to your pharmacy. -You are overdue to see your PCP, Dr Adah Salvage, for your blood pressure and diabetes. Please schedule an appointment on your way out today. -Your A1c was 8.8%. Your goal A1c is below 7%. -I have sent a new prescription for Ozempic '2mg'$  pens. Hopefully you will be able to get this soon.  Take care and seek immediate care sooner if you develop any concerns.  Dr. [facility]

## 2022-09-03 NOTE — Progress Notes (Signed)
    SUBJECTIVE:   CHIEF COMPLAINT / HPI:   Vaginal Itching -Symptoms started 3 days ago -Itching on external vaginal area -No discharge that she's aware of -No odor -No dysuria or urgency -Some urinary frequency -Concerned about yeast infection, h/o 1 prior yeast infection in Feb 2022 -Sexually active, declines STI testing -S/p total hysterectomy, not a candidate for cervical cancer screening -Of note, she has been out of her Ozempic x2 weeks due to supply shortages  PERTINENT  PMH / PSH: T2DM, obesity, HTN  OBJECTIVE:   BP (!) 140/72   Pulse 98   Wt 283 lb (128.4 kg)   SpO2 98%   BMI 44.32 kg/m   General: NAD, pleasant, able to participate in exam Respiratory: No respiratory distress Skin: warm and dry, no rashes noted Psych: Normal affect and mood Neuro: grossly intact GU/GYN: Exam performed in the presence of a chaperone. External genitalia within normal limits.  Vaginal mucosa pink, moist, normal rugae. Small amount of white discharge noted in vaginal introitus, no bleeding noted on speculum exam. No cervix seen.    ASSESSMENT/PLAN:   Vaginal Itching Wet prep consistent with yeast infection. Rx sent for diflucan. Likely related to inadequate glycemic control. Doubt Jardiance as a significant contributing factor.  Poorly controlled diabetes mellitus (HCC) A1c 8.8%. Has been out of her Ozempic x2 weeks due to supply shortages (taking '2mg'$  but using '1mg'$  pens x2). Updated Rx sent for '2mg'$  pens. Follow up with PCP for diabetes and HTN visit as soon as possible. Overdue for several diabetes items (foot exam, eye exam, BMP, urine ACR). Consider pharmacy referral for DM management as well.   Alcus Dad, MD [place]

## 2022-09-03 NOTE — Assessment & Plan Note (Signed)
A1c 8.8%. Has been out of her Ozempic x2 weeks due to supply shortages (taking '2mg'$  but using '1mg'$  pens x2). Updated Rx sent for '2mg'$  pens. Follow up with PCP for diabetes and HTN visit as soon as possible. Overdue for several diabetes items (foot exam, eye exam, BMP, urine ACR). Consider pharmacy referral for DM management as well.

## 2022-09-18 ENCOUNTER — Ambulatory Visit: Payer: BC Managed Care – PPO | Admitting: Student

## 2022-09-24 ENCOUNTER — Ambulatory Visit: Payer: BC Managed Care – PPO | Admitting: Student

## 2022-10-24 ENCOUNTER — Telehealth: Payer: Self-pay

## 2022-10-24 MED ORDER — FLUCONAZOLE 150 MG PO TABS: 150.0000 mg | ORAL_TABLET | Freq: Once | ORAL | 0 refills | Status: AC

## 2022-10-24 NOTE — Telephone Encounter (Signed)
Patient calls nurse line requesting #1 Diflucan pill.   She reports she was seen in October by Rock Nephew and was given #2 Diflucan. She reports one was to treat the current yeast infection and one was for future infections.   She reports a current yeast infection now, however she can not find the bottle with remaining Diflucan.   She reports burning and itching on the outside. She denies any vaginal discharge or urinary symptoms.   Will forward to provider who saw patient for additional Diflucan.

## 2022-10-24 NOTE — Telephone Encounter (Signed)
Rx sent for Diflucan, 1 tablet.  Patient NEEDS to be seen in the office for diabetes visit at her earliest convenience. Her yeast infections may be related to inadequate glycemic control plus SGLT-2 use. Might need to discuss discontinuing her Jardiance if recurrent yeast infections.  Please inform patient of above & schedule office appt.   Alcus Dad, MD PGY-3, [place]

## 2022-10-24 NOTE — Telephone Encounter (Signed)
Called patient and advised of provider message.   Scheduled PCP follow up for diabetes on 12/29.  [place] Grumbling, RN

## 2022-11-09 ENCOUNTER — Ambulatory Visit: Payer: BC Managed Care – PPO | Admitting: Student

## 2022-11-26 ENCOUNTER — Other Ambulatory Visit: Payer: Self-pay | Admitting: Student

## 2022-11-26 DIAGNOSIS — E1165 Type 2 diabetes mellitus with hyperglycemia: Secondary | ICD-10-CM

## 2023-01-07 ENCOUNTER — Other Ambulatory Visit: Payer: Self-pay | Admitting: Family Medicine

## 2023-01-07 DIAGNOSIS — Z1231 Encounter for screening mammogram for malignant neoplasm of breast: Secondary | ICD-10-CM

## 2023-01-17 ENCOUNTER — Other Ambulatory Visit: Payer: Self-pay | Admitting: Family Medicine

## 2023-01-19 ENCOUNTER — Other Ambulatory Visit: Payer: Self-pay | Admitting: Student

## 2023-01-19 DIAGNOSIS — E1165 Type 2 diabetes mellitus with hyperglycemia: Secondary | ICD-10-CM

## 2023-02-14 ENCOUNTER — Ambulatory Visit: Payer: BC Managed Care – PPO | Admitting: Family Medicine

## 2023-02-14 ENCOUNTER — Other Ambulatory Visit: Payer: Self-pay

## 2023-02-14 ENCOUNTER — Encounter: Payer: Self-pay | Admitting: Family Medicine

## 2023-02-14 ENCOUNTER — Ambulatory Visit (INDEPENDENT_AMBULATORY_CARE_PROVIDER_SITE_OTHER): Payer: BC Managed Care – PPO

## 2023-02-14 VITALS — BP 156/88 | HR 86 | Ht 67.0 in | Wt 286.0 lb

## 2023-02-14 DIAGNOSIS — G8929 Other chronic pain: Secondary | ICD-10-CM

## 2023-02-14 DIAGNOSIS — M25561 Pain in right knee: Secondary | ICD-10-CM

## 2023-02-14 DIAGNOSIS — M1711 Unilateral primary osteoarthritis, right knee: Secondary | ICD-10-CM | POA: Diagnosis not present

## 2023-02-14 NOTE — Progress Notes (Signed)
Rubin Payor, PhD, LAT, ATC acting as a scribe for [name], MD.  Subjective:    CC: R knee pain  HPI: Pt is a 57 y/o female c/o R knee pain x several months, sx wax and wane. Pt locates pain to medial aspect of the knee. Sx aggravated by getting off and on bus with steep stairs, position changes. Notes overall stiffness. Intermittent mechanical sx.   R Knee swelling: intermittent Mechanical symptoms: intermittent Aggravates: Deep flexion, sit-to-stand Treatments tried: brace, BioFreeze, Tylenol  No recent diagnostic imaging of the knee.    Pertinent review of Systems: No fevers or chills  Relevant historical information: Hypertension.  Obesity.  Diabetes   Objective:    Vitals:   02/14/23 1517  BP: (!) 156/88  Pulse: 86  SpO2: 100%   General: Well Developed, well nourished, and in no acute distress.   MSK: Right knee: Mild effusion otherwise normal. Normal motion. Tender palpation medial and lateral joint line. Laxity to LCL stress test. Intact strength.  Lab and Radiology Results  Procedure: Real-time Ultrasound Guided Injection of the right knee joint superior lateral patellar space Device: Philips Affiniti 50G Images permanently stored and available for review in PACS Verbal informed consent obtained.  Discussed risks and benefits of procedure. Warned about infection, bleeding, hyperglycemia damage to structures among others. Patient expresses understanding and agreement Time-out conducted.   Noted no overlying erythema, induration, or other signs of local infection.   Skin prepped in a sterile fashion.   Local anesthesia: Topical Ethyl chloride.   With sterile technique and under real time ultrasound guidance: 40 mg of Kenalog and 2 mL Marcaine injected into the knee joint. Fluid seen entering the joint capsule.   Completed without difficulty   Pain immediately resolved suggesting accurate placement of the medication.   Advised to call if  fevers/chills, erythema, induration, drainage, or persistent bleeding.   Images permanently stored and available for review in the ultrasound unit.  Impression: Technically successful ultrasound guided injection.   X-ray images right knee obtained today personally and independently interpreted Significant lateral compartment DJD.  No acute fractures are visible. Await formal radiology review   Impression and Recommendations:    Assessment and Plan: 57 y.o. female with right knee pain thought to be due to exacerbation of DJD.  Plan on steroid injection today.  She has lost of lot of weight successfully which has been a big help and will continue to help her knee.  Continued weight loss will improve her knee pain.  Ultimately she is going to need a knee replacement.  She needs to get her BMI under 40 for that.  She is on Ozempic for diabetes management which should help. Recheck in 6 weeks.  PDMP not reviewed this encounter. Orders Placed This Encounter  Procedures   [place] LIMITED JOINT SPACE STRUCTURES LOW RIGHT(NO LINKED CHARGES)    Order Specific Question:   Reason for Exam (SYMPTOM  OR DIAGNOSIS REQUIRED)    Answer:   right knee pain    Order Specific Question:   Preferred imaging location?    Answer:   [facility] Knee AP/LAT W/Sunrise Right    Standing Status:   Future    Number of Occurrences:   1    Standing Expiration Date:   03/16/2023    Order Specific Question:   Reason for Exam (SYMPTOM  OR DIAGNOSIS REQUIRED)    Answer:   right knee pain    Order  Specific Question:   Preferred imaging location?    Answer:   [facility]    Order Specific Question:   Is patient pregnant?    Answer:   No   No orders of the defined types were placed in this encounter.   Discussed warning signs or symptoms. Please see discharge instructions. Patient expresses understanding.   The above documentation has been reviewed and is accurate and complete [name], M.D.

## 2023-02-14 NOTE — Patient Instructions (Addendum)
Thank you for coming in today.   You received an injection today. Seek immediate medical attention if the joint becomes red, extremely painful, or is oozing fluid.   Please get an Xray today before you leave   Please use Voltaren gel (Generic Diclofenac Gel) up to 4x daily for pain as needed.  This is available over-the-counter as both the name brand Voltaren gel and the generic diclofenac gel.   Reschedule your visit with Dr. Tamala Julian  Check back with me in 6 weeks

## 2023-02-15 ENCOUNTER — Encounter: Payer: Self-pay | Admitting: Family Medicine

## 2023-02-17 ENCOUNTER — Other Ambulatory Visit: Payer: Self-pay | Admitting: Student

## 2023-02-17 DIAGNOSIS — E1165 Type 2 diabetes mellitus with hyperglycemia: Secondary | ICD-10-CM

## 2023-02-18 NOTE — Progress Notes (Signed)
Right knee x-ray shows a little bit of arthritis.

## 2023-02-20 ENCOUNTER — Ambulatory Visit
Admission: RE | Admit: 2023-02-20 | Discharge: 2023-02-20 | Disposition: A | Discharge status: Home / Self Care | Payer: BC Managed Care – PPO | Source: Ambulatory Visit | Attending: Family Medicine | Admitting: Family Medicine

## 2023-02-20 DIAGNOSIS — Z1231 Encounter for screening mammogram for malignant neoplasm of breast: Secondary | ICD-10-CM

## 2023-03-14 ENCOUNTER — Ambulatory Visit: Payer: BC Managed Care – PPO | Admitting: Family Medicine

## 2023-03-18 ENCOUNTER — Other Ambulatory Visit: Payer: Self-pay | Admitting: Student

## 2023-03-18 DIAGNOSIS — E1165 Type 2 diabetes mellitus with hyperglycemia: Secondary | ICD-10-CM

## 2023-03-28 ENCOUNTER — Ambulatory Visit: Payer: BC Managed Care – PPO | Admitting: Family Medicine

## 2023-04-24 ENCOUNTER — Other Ambulatory Visit: Payer: Self-pay | Admitting: Student

## 2023-05-20 ENCOUNTER — Other Ambulatory Visit: Payer: Self-pay | Admitting: Student

## 2023-05-20 DIAGNOSIS — E1165 Type 2 diabetes mellitus with hyperglycemia: Secondary | ICD-10-CM

## 2023-06-21 ENCOUNTER — Other Ambulatory Visit: Payer: Self-pay | Admitting: Student

## 2023-06-21 DIAGNOSIS — E1165 Type 2 diabetes mellitus with hyperglycemia: Secondary | ICD-10-CM

## 2023-06-28 NOTE — Progress Notes (Unsigned)
   Rubin Payor, PhD, LAT, ATC acting as a scribe for [name], MD.  Erin Schultz is a 57 y.o. female who presents to [facility] today for cont'd R knee pain. Pt was last seen by Dr. Denyse Amass on 02/14/23 and was given a R knee steroid injection and advised to cont her weight loss journey.  Today, pt reports ***  Dx imaging: 02/14/23 R knee XR  Pertinent review of systems: ***  Relevant historical information: ***   Exam:  There were no vitals taken for this visit. General: Well Developed, well nourished, and in no acute distress.   MSK: ***    Lab and Radiology Results No results found for this or any previous visit (from the past 72 hour(s)). No results found.     Assessment and Plan: 57 y.o. female with ***   PDMP not reviewed this encounter. No orders of the defined types were placed in this encounter.  No orders of the defined types were placed in this encounter.    Discussed warning signs or symptoms. Please see discharge instructions. Patient expresses understanding.   ***

## 2023-07-01 ENCOUNTER — Other Ambulatory Visit: Payer: Self-pay

## 2023-07-01 ENCOUNTER — Ambulatory Visit: Payer: BC Managed Care – PPO | Admitting: Family Medicine

## 2023-07-01 ENCOUNTER — Telehealth: Payer: Self-pay

## 2023-07-01 ENCOUNTER — Encounter: Payer: Self-pay | Admitting: Family Medicine

## 2023-07-01 VITALS — HR 97 | Ht 67.0 in | Wt 290.0 lb

## 2023-07-01 DIAGNOSIS — M25561 Pain in right knee: Secondary | ICD-10-CM

## 2023-07-01 DIAGNOSIS — M1711 Unilateral primary osteoarthritis, right knee: Secondary | ICD-10-CM | POA: Diagnosis not present

## 2023-07-01 DIAGNOSIS — G8929 Other chronic pain: Secondary | ICD-10-CM

## 2023-07-01 NOTE — Telephone Encounter (Signed)
Check coverage for Visco and [name]

## 2023-07-01 NOTE — Patient Instructions (Addendum)
Thank you for coming in today.  You received an injection today. Seek immediate medical attention if the joint becomes red, extremely painful, or is oozing fluid.  Let me know ahead of time about the gel shots and zilretta shots.

## 2023-07-02 ENCOUNTER — Telehealth: Payer: Self-pay

## 2023-07-02 NOTE — Telephone Encounter (Signed)
VOB initiated for [name] for RIGHT knee OA (Also checking visco)

## 2023-07-02 NOTE — Telephone Encounter (Signed)
 VOB initiated for ORTHOVISC for RIGHT knee OA.   (Also checking [name])

## 2023-07-03 NOTE — Telephone Encounter (Signed)
NO Prior Auth required for [employer]

## 2023-07-03 NOTE — Telephone Encounter (Signed)
[name] for RIGHT knee OA (Also checking Orthovisc)  Primary Insurance: [employer] [place] PPO Co-pay: $80 Co-insurance: n/a Deductible: does not apply Prior Auth NOT required    Knee Injection History:  02/14/23 - Kenalog - RIGHT 07/01/23 - Kenalog RIGHT

## 2023-07-04 NOTE — Telephone Encounter (Signed)
Holding for Orthovisc approval or denial.

## 2023-07-05 ENCOUNTER — Ambulatory Visit (INDEPENDENT_AMBULATORY_CARE_PROVIDER_SITE_OTHER): Payer: BC Managed Care – PPO | Admitting: Student

## 2023-07-05 ENCOUNTER — Encounter: Payer: Self-pay | Admitting: Student

## 2023-07-05 VITALS — BP 150/79 | HR 81 | Ht 67.0 in | Wt 286.2 lb

## 2023-07-05 DIAGNOSIS — Z7985 Long-term (current) use of injectable non-insulin antidiabetic drugs: Secondary | ICD-10-CM

## 2023-07-05 DIAGNOSIS — E1165 Type 2 diabetes mellitus with hyperglycemia: Secondary | ICD-10-CM

## 2023-07-05 DIAGNOSIS — I10 Essential (primary) hypertension: Secondary | ICD-10-CM | POA: Diagnosis not present

## 2023-07-05 DIAGNOSIS — Z7984 Long term (current) use of oral hypoglycemic drugs: Secondary | ICD-10-CM | POA: Diagnosis not present

## 2023-07-05 LAB — POCT GLYCOSYLATED HEMOGLOBIN (HGB A1C): HbA1c, POC (controlled diabetic range): 9 % — AB (ref 0.0–7.0)

## 2023-07-05 MED ORDER — WEGOVY 2.4 MG/0.75ML ~~LOC~~ SOAJ: 2.4000 mg | SUBCUTANEOUS | 2 refills | Status: DC

## 2023-07-05 MED ORDER — MOUNJARO 7.5 MG/0.5ML ~~LOC~~ SOAJ: 7.5000 mg | SUBCUTANEOUS | 2 refills | Status: DC

## 2023-07-05 NOTE — Progress Notes (Cosign Needed Addendum)
    SUBJECTIVE:   CHIEF COMPLAINT / HPI:   Diabetes, Type 2 - Last A1c -8.8 from 8 months ago - Medications: Metformin 1000 mg twice daily, Jardiance 25 mg daily, Ozempic 2 mg weekly - Compliance: Yes - Checking BG at home: 170- 200 - Diet: Poor - Exercise: No - Statin: Atorvastatin 40 mg daily - Denies symptoms of hypoglycemia but occasional polyuria, polydipsia. No numbness extremities, foot ulcers/trauma  -Patient has had 2 sessions of intra-articular steroid injection of her right knee for osteoarthritis.  PERTINENT  PMH / PSH: Reviewed  OBJECTIVE:   BP (!) 150/79   Pulse 81   Ht 5\' 7"  (1.702 m)   Wt 286 lb 3.2 oz (129.8 kg)   SpO2 100%   BMI 44.83 kg/m    Physical Exam General: Alert, well appearing, NAD Cardiovascular: RRR, No Murmurs, Normal S2/S2 Respiratory: CTAB, No wheezing or Rales Abdomen: No distension or tenderness Extremities: No edema on extremities    ASSESSMENT/PLAN:   Poorly controlled diabetes mellitus (HCC) Poorly controlled diabetes.  A1c increased to 9.0 from 8.8 about 8 months ago.  Most contributing factor is poor diet and which patient was very frank she is currently working on her diet and.  Also her most recent steroid injections could be attributing to her worsening A1c.  Had Homero Fellers conversation with patient given that she is on the max dose of metformin, Jardiance and Ozempic if she continues in this trend will most likely need insulin.  Patient is currently reluctant about starting insulin at this time.  Given no change in weight on Ozempic patient wants to explore other options including Wegovy. -Started patient on Mounjaro 7.5 mg weekly -Advised patient on dieting and exercise -Encouraged patient to keep a daily log for daily blood sugars to be reviewed at next visit -Follow-up in 4 weeks to reassess blood sugars -Will consider pharmacy referral  Primary hypertension She has blood pressure today was elevated.  She says she has not taken  her BP medication today but usually is compliant with her medication.  Currently asymptomatic, advised patient to keep a daily blood pressure log and if continually elevated she will to be seen to adjust blood pressure medication.  Patient verbalized understanding and agreeable to plan.     Jerre Simon, MD [facility]

## 2023-07-05 NOTE — Patient Instructions (Signed)
It was wonderful to see you today. Thank you for allowing me to be a part of your care. Below is a short summary of what we discussed at your visit today:  Your A1c today is 9.0.  I recommend you continue working on your diet and.  Please continue to monitor your daily blood sugar, keep a log and follow-up with me in 4 weeks to reassess your blood sugars.  I have switched you from Ozempic to [facility] which you will take 2.4 mg weekly.  I have ordered labs to test for your electrolytes, kidney function and your cholesterol level.  If you have any questions or concerns, please do not hesitate to contact us via phone or MyChart message.   Jerre Simon, MD [facility]

## 2023-07-05 NOTE — Assessment & Plan Note (Signed)
Poorly controlled diabetes.  A1c increased to 9.0 from 8.8 about 8 months ago.  Most contributing factor is poor diet and which patient was very frank she is currently working on her diet and.  Also her most recent steroid injections could be attributing to her worsening A1c.  Had Homero Fellers conversation with patient given that she is on the max dose of metformin, Jardiance and Ozempic if she continues in this trend will most likely need insulin.  Patient is currently reluctant about starting insulin at this time.  Given no change in weight on Ozempic patient wants to explore other options including Wegovy. -Started patient on Mounjaro 7.5 mg weekly -Advised patient on dieting and exercise -Encouraged patient to keep a daily log for daily blood sugars to be reviewed at next visit -Follow-up in 4 weeks to reassess blood sugars -Will consider pharmacy referral

## 2023-07-06 LAB — BASIC METABOLIC PANEL
BUN/Creatinine Ratio: 21 (ref 9–23)
BUN: 28 mg/dL — ABNORMAL HIGH (ref 6–24)
CO2: 23 mmol/L (ref 20–29)
Calcium: 10.4 mg/dL — ABNORMAL HIGH (ref 8.7–10.2)
Chloride: 100 mmol/L (ref 96–106)
Creatinine, Ser: 1.35 mg/dL — ABNORMAL HIGH (ref 0.57–1.00)
Glucose: 176 mg/dL — ABNORMAL HIGH (ref 70–99)
Potassium: 3.9 mmol/L (ref 3.5–5.2)
Sodium: 139 mmol/L (ref 134–144)
eGFR: 46 mL/min/{1.73_m2} — ABNORMAL LOW (ref >59)

## 2023-07-06 LAB — LIPID PANEL
Chol/HDL Ratio: 3 ratio (ref 0.0–4.4)
Cholesterol, Total: 181 mg/dL (ref 100–199)
HDL: 61 mg/dL (ref >39)
LDL Chol Calc (NIH): 91 mg/dL (ref 0–99)
Triglycerides: 173 mg/dL — ABNORMAL HIGH (ref 0–149)
VLDL Cholesterol Cal: 29 mg/dL (ref 5–40)

## 2023-07-08 NOTE — Assessment & Plan Note (Signed)
She has blood pressure today was elevated.  She says she has not taken her BP medication today but usually is compliant with her medication.  Currently asymptomatic, advised patient to keep a daily blood pressure log and if continually elevated she will to be seen to adjust blood pressure medication.  Patient verbalized understanding and agreeable to plan.

## 2023-07-08 NOTE — Telephone Encounter (Signed)
Prior Auth REQUIRED for Orthovisc

## 2023-07-09 NOTE — Telephone Encounter (Signed)
Prior Auth initiated via [employer] Case#: [id number] Status: pending review

## 2023-07-11 NOTE — Telephone Encounter (Signed)
Form received by fax from [facility] requesting additional information.   Form completed and faxed back to [facility].

## 2023-07-16 NOTE — Telephone Encounter (Signed)
Orthovisc for RIGHT knee OA (Also checking [name])  Primary Insurance: BCBS [facility] PPO Co-pay: $80 (per date of service) Co-insurance: n/a Deductible: does not apply  Prior Auth: APPROVED PA# [id number] Valid: 07/09/23-01/05/24   Knee Injection History 02/14/23 - Kenalog - RIGHT 07/01/23 - Kenalog RIGHT

## 2023-07-16 NOTE — Telephone Encounter (Signed)
  APPROVED PA# [id number] Valid: 07/09/23-01/05/24

## 2023-07-18 ENCOUNTER — Other Ambulatory Visit: Payer: Self-pay | Admitting: Student

## 2023-07-19 ENCOUNTER — Telehealth: Payer: Self-pay

## 2023-07-19 ENCOUNTER — Other Ambulatory Visit: Payer: Self-pay | Admitting: Student

## 2023-07-19 DIAGNOSIS — I10 Essential (primary) hypertension: Secondary | ICD-10-CM

## 2023-07-19 DIAGNOSIS — E1165 Type 2 diabetes mellitus with hyperglycemia: Secondary | ICD-10-CM

## 2023-07-19 NOTE — Telephone Encounter (Signed)
Left message for patient to call back to schedule. Supply has been ordered. Okay to schedule 07/24/23 or after when injection is in stock.

## 2023-07-19 NOTE — Telephone Encounter (Signed)
Pharmacy Patient Advocate Encounter   Received notification from CoverMyMeds that prior authorization for [facility] is required/requested.   Insurance verification completed.   The patient is insured through [facility] .   Per test claim: PA required; PA submitted to [employer] [facility] via CoverMyMeds Key/confirmation #/EOC [id number]. Status is pending

## 2023-07-22 NOTE — Telephone Encounter (Signed)
Pharmacy Patient Advocate Encounter  Received notification from [facility] that Prior Authorization for Erin Schultz has been APPROVED from 07/19/23 to 07/18/26   PA #/Case ID/Reference #: [id number]

## 2023-07-25 ENCOUNTER — Other Ambulatory Visit: Payer: Self-pay | Admitting: Student

## 2023-07-25 DIAGNOSIS — E1165 Type 2 diabetes mellitus with hyperglycemia: Secondary | ICD-10-CM

## 2023-08-02 ENCOUNTER — Telehealth: Payer: Self-pay | Admitting: Pharmacist

## 2023-08-02 NOTE — Telephone Encounter (Signed)
Contacted patient for follow-up of blood pressure/hypertension control.   Scheduled appointment for BP check on 08/07/23.   Total time with patient call and documentation of interaction: 4 minutes.

## 2023-08-07 ENCOUNTER — Encounter: Payer: Self-pay | Admitting: Pharmacist

## 2023-08-07 ENCOUNTER — Ambulatory Visit: Payer: BC Managed Care – PPO | Admitting: Pharmacist

## 2023-08-07 VITALS — BP 149/82 | HR 81 | Ht 65.5 in | Wt 285.8 lb

## 2023-08-07 DIAGNOSIS — Z7984 Long term (current) use of oral hypoglycemic drugs: Secondary | ICD-10-CM

## 2023-08-07 DIAGNOSIS — E1165 Type 2 diabetes mellitus with hyperglycemia: Secondary | ICD-10-CM

## 2023-08-07 DIAGNOSIS — I10 Essential (primary) hypertension: Secondary | ICD-10-CM

## 2023-08-07 LAB — POCT GLYCOSYLATED HEMOGLOBIN (HGB A1C): HbA1c, POC (controlled diabetic range): 9 % — AB (ref 0.0–7.0)

## 2023-08-07 MED ORDER — MOUNJARO 10 MG/0.5ML ~~LOC~~ SOAJ: 10.0000 mg | SUBCUTANEOUS | 5 refills | Status: DC

## 2023-08-07 MED ORDER — AMLODIPINE BESYLATE 2.5 MG PO TABS
2.5000 mg | ORAL_TABLET | Freq: Every day | ORAL | 5 refills | Status: DC
Start: 2023-08-07 — End: 2024-01-09

## 2023-08-07 NOTE — Assessment & Plan Note (Signed)
Hypertension diagnosed ~ 2008 according to Epic currently uncontrolled with in-office reading of 148/71 mm Hg on current medications. Patient reports her BP is checked at work and states her latest reading of 127/80 mm Hg - possible white coat hypertension. BP goal < 130/80 mmHg. Medication adherence appears good. Control is suboptimal due to patient needing additional blood pressure-lowering agent.  - Started amlodipine 2.5 mg once daily. Instructed patient that if she experiences any significant leg swelling, to stop the medication. - Continued losartan 100 mg once daily.  -Patient educated on purpose, proper use, and potential adverse effects of amlodipine. -Counseled on lifestyle modifications for blood pressure control including reduced dietary sodium, increased exercise, adequate sleep. -Encouraged patient to check BP at home and bring log of readings to next visit. Counseled on proper use of home BP cuff.   Liberate Study: - Patient provided consent and enrolled in the Liberate Study. - Provided patient with 8 sensors. - Thoroughly educated patient on the [employer] system. Assisted patient with application of sensor and connection to phone. - Instructed patient to bring sensor to follow up visit for application assistance.

## 2023-08-07 NOTE — Patient Instructions (Addendum)
It was nice to see you today!  Thanks for enrolling in the LIBERATE CGM - Libre 3 study.   Your goal blood sugar is 80-130 before eating and less than 180 after eating.  Your goal blood pressure is < 130/80 mm Hg  Medication Changes:  Take last dose of Mounjaro 7.5mg  weekly THEN  Increase to 10mg  weekly.  New prescription ordered.   Start amlodipine 2.5mg  once daily.  If you experience any significant leg swelling, please stop.  We will continue to adjust your medicine at your next visit.   Continue all other medication the same.   Keep up the good work with diet and exercise. Aim for a diet full of vegetables, fruit and lean meats (chicken, [place], fish). Try to limit salt intake by eating fresh or frozen vegetables (instead of canned), rinse canned vegetables prior to cooking and do not add any additional salt to meals.       Sensor Application If using the App, you can tap Help in the Main Menu to access an in-app tutorial on applying a Sensor. See below for instructions on how to download the app. Apply Sensors only on the back of your upper arm. If placed in other areas, the Sensor may not function properly and could give you inaccurate readings. Avoid areas with scars, moles, stretch marks, or lumps.   Select an area of skin that generally stays flat during your normal daily activities (no bending or folding). Choose a site that is at least 1 inch (2.5 cm) away from any injection sites. To prevent discomfort or skin irritation, you should select a different site other than the one most recently used. Wash application site using a plain soap, dry, and then clean with an alcohol wipe. This will help remove any oily residue that may prevent the sensor from sticking properly. Allow site to air dry before proceeding. Note: The area MUST be clean and dry, or the Sensor may not stay on for the full wear duration specified by your Sensor insert. 4. Unscrew the cap from the Sensor Applicator  and set the cap aside.  5. Place the Sensor Applicator over the prepared site and push down firmly to apply the Sensor to your body. 6. Gently pull the Sensor Applicator away from your body. The Sensor should now be attached to your skin. 7. Make sure the Sensor is secure after application. Put the cap back on the Sensor Applicator. Discard the used Engineer, agricultural according to local regulations.  What If My Sensor Falls Off or What If My Sensor Isn't Working? Call [employer] Customer Care Team at [phone] Available 7 days a week from 8AM-8PM EST, excluding holidays If yo have multiple sensors fall off prior to 14 days of use, contact [facility] at [phone]   The App Download the FreeStyle Barrera 3 App in your phone's app store   Load the app and select get started now Create an account  Tap scan new sensor Follow the prompts on the screen. If your sensor does not sync, try moving your phone slowly around the sensor. Phone cases may affect scanning. This will be the only time you have to scan the sensor until you apply a new sensor in 14 days.  There will be a 60 minute start up period until the app will display your glucose reading

## 2023-08-07 NOTE — Assessment & Plan Note (Signed)
Diabetes diagnosed ~ 2011 according to Epic currently uncontrolled with today's A1c of 9.0%. Patient reports new lifestyle modifications including exercise to help with glucose control. Patient reports adherence with medication. Control is suboptimal due to patient potentially needing insulin. - Increased Mounjaro (tirzepitide) from 7.5 to 10 mg once weekly. - Continued Jardiance (empagliflozin) 25 mg once daily. - Patient educated on purpose, proper use and potential adverse effects of Mounjaro. - Following instruction patient verbalized understanding of treatment plan.  - Reevaluate for potential once daily insulin therapy at future visit.

## 2023-08-07 NOTE — Progress Notes (Signed)
S:     Chief Complaint  Patient presents with   Medication Management    Blood pressure check Diabetes - Liberate Initial Visit   57 y.o. female who presents for hypertension and diabetes evaluation, education, and management in the context of the Liberate Study.  PMH is significant for HTN, HLD, diabetes.  Patient was referred and last seen by Primary Care Provider, Dr. Elliot Gurney, on 07/05/23.   At last visit, patient was switched from Ozempic (semaglutide) 2 mg once weekly to Mounjaro (tirzepitide) 7.5 mg once weekly.   Today, patient arrives in good spirits and presents without any assistance. Denies dizziness, headache, blurred vision, swelling.   Patient diagnosed with hypertension ~ 2008 according to Epic.   Family/Social history: strong family history of HTN  Medication adherence good. Patient has taken BP medications today.   Current antihypertensives include: losartan 100 mg once daily  Antihypertensives tried in the past include: amlodipine benazepril 5-40 mg once daily, amlodipine benazepril 10-40 mg once daily (amlodipine was previously stopped 08/2021 due to side effect of bilateral edema), triamterene-hydrochlorothiazide 37.5-25 mg once daily  Reported home BP readings: patient reports not having cuff at home to check BP   Patient reported dietary habits: Eats 3 meals/day  Patient-reported exercise habits: patient reports goal of 10,000 steps every day especially on the weekends   O:  Review of Systems  All other systems reviewed and are negative.  Physical Exam Pulmonary:     Effort: Pulmonary effort is normal.  Musculoskeletal:     Right lower leg: No edema.     Left lower leg: No edema.  Neurological:     Mental Status: She is alert.  Psychiatric:        Mood and Affect: Mood normal.        Behavior: Behavior normal.        Thought Content: Thought content normal.        Judgment: Judgment normal.    Last 3 Office BP readings: BP Readings from  Last 3 Encounters:  08/07/23 (!) 149/82  07/05/23 (!) 150/79  02/14/23 (!) 156/88   BMET    Component Value Date/Time   NA 139 07/05/2023 1702   K 3.9 07/05/2023 1702   CL 100 07/05/2023 1702   CO2 23 07/05/2023 1702   GLUCOSE 176 (H) 07/05/2023 1702   GLUCOSE 337 (H) 12/24/2016 1648   BUN 28 (H) 07/05/2023 1702   CREATININE 1.35 (H) 07/05/2023 1702   CREATININE 1.03 12/24/2016 1648   CALCIUM 10.4 (H) 07/05/2023 1702   GFRNONAA 45 (L) 12/30/2020 0856   GFRNONAA 63 12/24/2016 1648   GFRAA 51 (L) 12/30/2020 0856   GFRAA 73 12/24/2016 1648   Renal function: CrCl cannot be calculated (Patient's most recent lab result is older than the maximum 21 days allowed.).  Clinical ASCVD: No  The 10-year ASCVD risk score (Arnett DK, et al., 2019) is: 18.3%   Values used to calculate the score:     Age: 56 years     Sex: Female     Is Non-Hispanic African American: Yes     Diabetic: Yes     Tobacco smoker: No     Systolic Blood Pressure: 149 mmHg     Is BP treated: Yes     HDL Cholesterol: 61 mg/dL     Total Cholesterol: 181 mg/dL  A/P: Hypertension diagnosed ~ 2008 according to Epic currently uncontrolled with in-office reading of 148/71 mm Hg on current medications. Patient reports her BP  is checked at work and states her latest reading of 127/80 mm Hg - possible white coat hypertension. BP goal < 130/80 mmHg. Medication adherence appears good. Control is suboptimal due to patient needing additional blood pressure-lowering agent.  - Started amlodipine 2.5 mg once daily. Instructed patient that if she experiences any significant leg swelling, to stop the medication. - Continued losartan 100 mg once daily.  -Patient educated on purpose, proper use, and potential adverse effects of amlodipine. -Counseled on lifestyle modifications for blood pressure control including reduced dietary sodium, increased exercise, adequate sleep. -Encouraged patient to check BP at home and bring log of  readings to next visit. Counseled on proper use of home BP cuff.   Liberate Study: - Patient provided consent and enrolled in the Liberate Study. - Provided patient with 8 sensors. - Thoroughly educated patient on the [employer] system. Assisted patient with application of sensor and connection to phone. - Instructed patient to bring sensor to follow up visit for application assistance.  Diabetes diagnosed ~ 2011 according to Epic currently uncontrolled with today's A1c of 9.0%. Patient reports new lifestyle modifications including exercise to help with glucose control. Patient reports adherence with medication. Control is suboptimal due to patient potentially needing insulin. - Increased Mounjaro (tirzepitide) from 7.5 to 10 mg once weekly. - Continued Jardiance (empagliflozin) 25 mg once daily. - Patient educated on purpose, proper use and potential adverse effects of Mounjaro. - Following instruction patient verbalized understanding of treatment plan.  - Reevaluate for potential once daily insulin therapy at future visit.  Results reviewed and written information provided.    Written patient instructions provided. Patient verbalized understanding of treatment plan.  Total time in face to face counseling 52 minutes.    Follow-up:  Pharmacist visit on 08/21/23. PCP clinic visit PRN.  Patient seen with Andee Poles, PharmD Candidate.

## 2023-08-08 NOTE — Progress Notes (Signed)
Reviewed and agree with Dr Koval's plan.   

## 2023-08-14 ENCOUNTER — Telehealth: Payer: Self-pay | Admitting: Pharmacist

## 2023-08-14 NOTE — Telephone Encounter (Signed)
Patient contacted our office and reported that her CGM was providing readings that were lower than expected.  Patient denied any symptoms that would be typical with readings < 60mg /dl.  She asked if anything could be done with sensor.  She was advised to replace the sensor.    Total time with patient call and documentation of interaction: 12 minutes.

## 2023-08-17 ENCOUNTER — Other Ambulatory Visit: Payer: Self-pay | Admitting: Student

## 2023-08-17 DIAGNOSIS — E785 Hyperlipidemia, unspecified: Secondary | ICD-10-CM

## 2023-08-21 ENCOUNTER — Ambulatory Visit: Payer: BC Managed Care – PPO | Admitting: Pharmacist

## 2023-08-29 ENCOUNTER — Encounter: Payer: Self-pay | Admitting: Pharmacist

## 2023-08-29 ENCOUNTER — Ambulatory Visit: Payer: BC Managed Care – PPO | Admitting: Pharmacist

## 2023-08-29 VITALS — BP 127/79 | HR 76 | Wt 286.6 lb

## 2023-08-29 DIAGNOSIS — I10 Essential (primary) hypertension: Secondary | ICD-10-CM | POA: Diagnosis not present

## 2023-08-29 DIAGNOSIS — E1165 Type 2 diabetes mellitus with hyperglycemia: Secondary | ICD-10-CM

## 2023-08-29 NOTE — Assessment & Plan Note (Signed)
Hypertension longstanding currently controlled. Blood pressure goal of <130/80 mmHg. Medication adherence okay, recommended Pt start taking her amlodipine   - Continued  losartan 100 mg once daily (patient not taking amlodipine)  - Continue to reassess blood pressure and revaluate use/dose of amlodipine at future visit.

## 2023-08-29 NOTE — Progress Notes (Signed)
Reviewed and agree with Dr Koval's plan.   

## 2023-08-29 NOTE — Patient Instructions (Signed)
It was nice to see you today!  Your goal blood sugar is 80-130 before eating and less than 180 after eating.  Medication Changes: Continue all other medication the same.   Options for bandage over sensor: Skin Grip  Consider wearing sensor on arm you don't tend to fall asleep on.   Monitor blood sugars at home and keep a log (glucometer or piece of paper) to bring with you to your next visit.  Keep up the good work with diet and exercise. Aim for a diet full of vegetables, fruit and lean meats (chicken, [place], fish). Try to limit salt intake by eating fresh or frozen vegetables (instead of canned), rinse canned vegetables prior to cooking and do not add any additional salt to meals.

## 2023-08-29 NOTE — Assessment & Plan Note (Signed)
Diabetes longstanding currently uncontrolled. Patient is able to verbalize appropriate hypoglycemia management plan. Medication adherence appears good. Control remains suboptimal as patient has taken only 1 of the 10mg  doses for tirzepatide Greggory Keen). -Patient educated on purpose, proper use, and potential adverse effects.  -Extensively discussed pathophysiology of diabetes, recommended lifestyle interventions, dietary effects on blood sugar control.  - Continued metformin 1000 mg BID, empagliflozin (Jardiance) 25 mg once daily, tirzepatide (Mounjaro) 10 mg once weekly -Counseled on s/sx of and management of hypoglycemia.

## 2023-08-29 NOTE — Progress Notes (Signed)
S:     Chief Complaint  Patient presents with   Medication Management    DM - Liberate F/U   57 y.o. female who presents for diabetes evaluation, education, and management in the context of the LIBERATE Study.  PMH is significant for T2DM. HTN, HLD.   Patient was referred and last seen by Primary Care Provider, Dr. Elliot Gurney, on 07/05/2023.   Patient was recently switched from Ozempic (semaglutide) 2 mg once weekly to tirzepitide (Mounjaro) 7.5 mg once weekly.  On, 08/07/2023 Pharmacy visit tirzepatide Greggory Keen) was increased to 10 mg once weekly.   Today, patient arrives in good spirits and presents without any assistance. Reports occasional dry mouth accompanied by a headache, but they don't last long. Using MIRALAX to help with bowel discomfort after tirzepatide Greggory Keen). Reporting increased energy levels. Starting new part time job soon with [employer].   Patient reports Diabetes was diagnosed ~2011 in Epic   Family/Social History: strong family hx of HTN  Current diabetes medications include: metformin 1000 mg BID, empagliflozin (Jardiance) 25 mg once daily, tirzepatide (Mounjaro) 10 mg once weekly (she has 3 doses remaining - she has only used 1 dose of 10mg ) Current hypertension medications include: amlodipine 2.5 mg once daily, losartan 100 mg once daily Current hyperlipidemia medications include: pravastatin 40 mg once daily  Patient reports adherence to taking all medications as prescribed, except has not started taking amlodipine 2.5 mg.  Do you feel that your medications are working for you? yes Have you been experiencing any side effects to the medications prescribed? no Insurance coverage: BCBS  Patient denies hypoglycemic events.  Patient reports nocturia (nighttime urination). ~ 2 times nightly Patient denies neuropathy (nerve pain). Patient denies visual changes. Patient reports self foot exams.   Patient reported dietary habits: eating more grilled  protein (chicken mainly), eating more healthy nuts (almonds), dates, protein shakes (fairlife/boost chocolate protein shakes), apples (Gala) with peanut butter. Pt reports tirzepatide Greggory Keen) is taking away food cravings & food noise.   Drinks: only water  Patient-reported exercise habits: goes on ~20 min walks in the afternoon after working & also works during part-time job and starting to jump rope  O:   ROS  Physical Exam Vitals reviewed.  Constitutional:      Appearance: Normal appearance.  Pulmonary:     Effort: Pulmonary effort is normal.  Neurological:     Mental Status: She is alert.  Psychiatric:        Mood and Affect: Mood normal.        Behavior: Behavior normal.        Thought Content: Thought content normal.        Judgment: Judgment normal.     Lab Results  Component Value Date   HGBA1C 9.0 (A) 08/07/2023    7 day average blood glucose: 198  Libre3 CGM Download 08/22/2023  % Time CGM is active: 84% Average Glucose: 198 mg/dL Glucose Management Indicator: 8.0  Glucose Variability: 21.5% (goal <36%) Time in Goal:  - Time in range 70-180: 33% - Time above range: 65% - Time below range: 2%  Vitals:   08/29/23 1346  BP: 127/79  Pulse: 76  SpO2: 100%     Lipid Panel     Component Value Date/Time   CHOL 181 07/05/2023 1702   TRIG 173 (H) 07/05/2023 1702   HDL 61 07/05/2023 1702   CHOLHDL 3.0 07/05/2023 1702   CHOLHDL 4.0 12/24/2016 1648   VLDL 52 (H) 12/24/2016 1648  LDLCALC 91 07/05/2023 1702   LDLDIRECT 150 (H) 05/10/2014 1128    Clinical Atherosclerotic Cardiovascular Disease (ASCVD): No  The 10-year ASCVD risk score (Arnett DK, et al., 2019) is: 11.5%   Values used to calculate the score:     Age: 35 years     Sex: Female     Is Non-Hispanic African American: Yes     Diabetic: Yes     Tobacco smoker: No     Systolic Blood Pressure: 127 mmHg     Is BP treated: Yes     HDL Cholesterol: 61 mg/dL     Total Cholesterol: 181 mg/dL    A/P: Diabetes longstanding currently uncontrolled. Patient is able to verbalize appropriate hypoglycemia management plan. Medication adherence appears good. Control remains suboptimal as patient has taken only 1 of the 10mg  doses for tirzepatide Greggory Keen). -Patient educated on purpose, proper use, and potential adverse effects.  -Extensively discussed pathophysiology of diabetes, recommended lifestyle interventions, dietary effects on blood sugar control.  - Continued metformin 1000 mg BID, empagliflozin (Jardiance) 25 mg once daily, tirzepatide (Mounjaro) 10 mg once weekly -Counseled on s/sx of and management of hypoglycemia.   ASCVD risk - primary prevention in patient with diabetes. Last LDL is 91 (06/2023) not at goal of <70 mg/dL. - Continued pravastatin 40 mg once daily  Hypertension longstanding currently controlled. Blood pressure goal of <130/80 mmHg. Medication adherence okay, recommended Pt start taking her amlodipine   - Continued  losartan 100 mg once daily (patient not taking amlodipine)  - Continue to reassess blood pressure and revaluate use/dose of amlodipine at future visit.   Written patient instructions provided. Patient verbalized understanding of treatment plan.  Total time in face to face counseling 24 minutes.    Follow-up:  Pharmacist 09/17/2023. PCP clinic visit in PRN.  Patient seen with Shona Simpson, PharmD Candidate.

## 2023-09-13 ENCOUNTER — Other Ambulatory Visit: Payer: Self-pay | Admitting: Student

## 2023-09-13 DIAGNOSIS — E1165 Type 2 diabetes mellitus with hyperglycemia: Secondary | ICD-10-CM

## 2023-09-17 ENCOUNTER — Ambulatory Visit: Payer: BC Managed Care – PPO | Admitting: Pharmacist

## 2023-11-10 ENCOUNTER — Other Ambulatory Visit: Payer: Self-pay | Admitting: Student

## 2023-11-10 DIAGNOSIS — E1165 Type 2 diabetes mellitus with hyperglycemia: Secondary | ICD-10-CM

## 2023-11-14 ENCOUNTER — Telehealth: Payer: Self-pay | Admitting: Pharmacist

## 2023-11-14 NOTE — Telephone Encounter (Signed)
 Patient returned call for for follow-up of CGM  - Liberate 3 month visit.   Since last contact patient reports CGM helping with accountability and diabetes control.  Scheduled visit for 1 week (11/21/2023) Sensor placed at front desk for pick-up and use.    Total time with patient call and documentation of interaction: 8 minutes.

## 2023-11-14 NOTE — Telephone Encounter (Signed)
 VOB initiated for 2025 coverage of [name].

## 2023-11-14 NOTE — Telephone Encounter (Signed)
 Attempted to contact patient for follow-up of need to schedule Liberate 3 month  visit.    Left HIPAA compliant voice mail requesting call back to direct phone: [phone]  Total time with patient call and documentation of interaction: 8 minutes.

## 2023-11-21 ENCOUNTER — Encounter: Payer: Self-pay | Admitting: Pharmacist

## 2023-11-21 ENCOUNTER — Encounter (INDEPENDENT_AMBULATORY_CARE_PROVIDER_SITE_OTHER): Payer: 59 | Admitting: Pharmacist

## 2023-11-21 VITALS — BP 136/77 | HR 71 | Wt 284.0 lb

## 2023-11-21 DIAGNOSIS — E782 Mixed hyperlipidemia: Secondary | ICD-10-CM

## 2023-11-21 DIAGNOSIS — E1169 Type 2 diabetes mellitus with other specified complication: Secondary | ICD-10-CM

## 2023-11-21 DIAGNOSIS — E1165 Type 2 diabetes mellitus with hyperglycemia: Secondary | ICD-10-CM

## 2023-11-21 DIAGNOSIS — I10 Essential (primary) hypertension: Secondary | ICD-10-CM

## 2023-11-21 LAB — POCT GLYCOSYLATED HEMOGLOBIN (HGB A1C): HbA1c, POC (controlled diabetic range): 7.6 % — AB (ref 0.0–7.0)

## 2023-11-21 NOTE — Patient Instructions (Signed)
 It was nice to see you today!  Your goal blood sugar is 80-130 before eating and less than 180 after eating.  Medication Changes: Continue all other medication the same.  (Continue to HOLD).   Monitor blood sugars at home and keep a log (glucometer or piece of paper) to bring with you to your next visit.  Keep up the good work with diet and exercise. Aim for a diet full of vegetables, fruit and lean meats (chicken, turkey, fish). Try to limit salt intake by eating fresh or frozen vegetables (instead of canned), rinse canned vegetables prior to cooking and do not add any additional salt to meals.

## 2023-11-21 NOTE — Assessment & Plan Note (Signed)
 Hypertension longstanding currently near goal of < 130/80 with today's reading of 136/77. Medication adherence good with losartan  (not taking amlodipine ). Blood pressure control remains suboptimal due to lack of second drug. -Reevaluate out of office readings and BP at next visit - Reconsider amlodipine  at that time.

## 2023-11-21 NOTE — Assessment & Plan Note (Signed)
 ASCVD risk - in patient with diabetes who has intolerance to higher dose of pravastatin .  Has also tired atorvastatin  in the past.  Last LDL is not at goal of <70  mg/dL.  -Continued pravastatin  40 mg.  - Consider trial of rosuvastatin 20 or 40mg  in the future

## 2023-11-21 NOTE — Research (Signed)
 S:     Chief Complaint  Patient presents with   Medication Management    Liberate - 3 month    58 y.o. female who presents for diabetes evaluation, education, and management in the context of the LIBERATE Study.   PMH is significant for T2DM, HTN, HLD.   Patient was referred and last seen by Primary Care Provider, Dr. Rosendo , on 07/05/2023.   At last visit, patient was continued on previous therapy as she was doing well and had just recently increased to Mounjaro  (tirzepatide ) 10mg  weekly.   Today, patient arrives in good spirits and presents without any assistance.   Patient reports Diabetes was diagnosed in ~ 2011.   Current diabetes medications include: metformin  1000 mg BID, empagliflozin  (Jardiance ) 25 mg once daily, tirzepatide  (Mounjaro ) 10 mg once weekly  Current hypertension medications include: not taking previously prescribed amlodpine Current hyperlipidemia medications include: pravastatin  40mg  daily  Patient reports adherence to taking all medications as prescribed with exception of amlodipine .  Do you feel that your medications are working for you? yes Have you been experiencing any side effects to the medications prescribed? No (mild constipation increase with tirzepatide  - managing appropriately).  Insurance coverage: Aetna  Patient denies hypoglycemic events.  Patient reported dietary habits:  notices satiety and reports eating smaller portions.   O:   Review of Systems  Gastrointestinal:  Positive for constipation (mild).  All other systems reviewed and are negative.  Physical Exam Vitals reviewed.  Constitutional:      Appearance: Normal appearance.  Pulmonary:     Effort: Pulmonary effort is normal.  Musculoskeletal:     Right lower leg: No edema.     Left lower leg: No edema.  Neurological:     Mental Status: She is alert. Mental status is at baseline.  Psychiatric:        Mood and Affect: Mood normal.        Behavior: Behavior normal.         Thought Content: Thought content normal.        Judgment: Judgment normal.    Libre3 CGM Download today - last readings through 10/13/2023 % Time CGM is active: 68% Average Glucose: 163 mg/dL Glucose Management Indicator: 7.2  Glucose Variability: 16.6% (goal <36%) Time in Goal:  - Time in range 70-180: 76% - Time above range: 24% - Time below range: 0%   Lab Results  Component Value Date   HGBA1C 7.6 (A) 11/21/2023    Vitals:   11/21/23 0944  BP: 136/77  Pulse: 71  SpO2: 100%     Lipid Panel     Component Value Date/Time   CHOL 181 07/05/2023 1702   TRIG 173 (H) 07/05/2023 1702   HDL 61 07/05/2023 1702   CHOLHDL 3.0 07/05/2023 1702   CHOLHDL 4.0 12/24/2016 1648   VLDL 52 (H) 12/24/2016 1648   LDLCALC 91 07/05/2023 1702   LDLDIRECT 150 (H) 05/10/2014 1128    Clinical Atherosclerotic Cardiovascular Disease (ASCVD): No  The 10-year ASCVD risk score (Arnett DK, et al., 2019) is: 14.1%   Values used to calculate the score:     Age: 45 years     Sex: Female     Is Non-Hispanic African American: Yes     Diabetic: Yes     Tobacco smoker: No     Systolic Blood Pressure: 136 mmHg     Is BP treated: Yes     HDL Cholesterol: 61 mg/dL  Total Cholesterol: 181 mg/dL     A/P:  LIBERATE Study:  - 8 sensors provided for a 3 month supply. Educated to contact the office if the sensor falls off early and replacements are needed before their next [employer].   Diabetes longstanding and currently improved with 2 pound weight loss over the holiday season using current regimen. Her A1C today at 3 month Liberate study visit is reduced to 7.6 from 9.0 at start of study (3 months prior). Patient is able to verbalize appropriate hypoglycemia management plan. Medication adherence appears good. Control is suboptimal due to dietary indiscretion and sedentary lifestyle. -Continued GLP-1/GIP Mounjaro  (tirzepatide ) 10mg  weekly -Continued SGLT2-I Jardiance  (empagliflozin )  25mg  daily.  -Continued metformin  1000mg  BID -Patient educated on purpose, proper use, and potential adverse effects.  -Extensively discussed pathophysiology of diabetes, recommended lifestyle interventions, dietary effects on blood sugar control.  -Counseled on s/sx of and management of hypoglycemia.  -Next A1c anticipated 3 months from today.   ASCVD risk - in patient with diabetes who has intolerance to higher dose of pravastatin .  Has also tired atorvastatin  in the past.  Last LDL is not at goal of <70  mg/dL.  -Continued pravastatin  40 mg.  - Consider trial of rosuvastatin 20 or 40mg  in the future  Hypertension longstanding currently near goal of < 130/80 with today's reading of 136/77. Medication adherence good with losartan  (not taking amlodipine ). Blood pressure control remains suboptimal due to lack of second drug. -Reevaluate out of office readings and BP at next visit - Reconsider amlodipine  at that time.   Written patient instructions provided. Patient verbalized understanding of treatment plan.  Total time in face to face counseling 27 minutes.    Follow-up:  Pharmacist 01/09/2024. PCP clinic visit in After 6 month end of liberate study visit in Spring.  Patient seen with Owens Cowing, PharmD Candidate. SABRA

## 2023-11-21 NOTE — Assessment & Plan Note (Signed)
 Diabetes longstanding and currently improved with 2 pound weight loss over the holiday season using current regimen. Her A1C today at 3 month Liberate study visit is reduced to 7.6 from 9.0 at start of study (3 months prior). Patient is able to verbalize appropriate hypoglycemia management plan. Medication adherence appears good. Control is suboptimal due to dietary indiscretion and sedentary lifestyle. -Continued GLP-1/GIP Mounjaro  (tirzepatide ) 10mg  weekly -Continued SGLT2-I Jardiance  (empagliflozin ) 25mg  daily.  -Continued metformin  1000mg  BID -Patient educated on purpose, proper use, and potential adverse effects.  -Extensively discussed pathophysiology of diabetes, recommended lifestyle interventions, dietary effects on blood sugar control.  -Counseled on s/sx of and management of hypoglycemia.  -Next A1c anticipated 3 months from today.

## 2023-11-27 NOTE — Telephone Encounter (Addendum)
 Per [name] - The policy ID [id number] has been terminated as of 11/12/2023.   Update insurance

## 2023-12-03 NOTE — Telephone Encounter (Signed)
VOB initiated for Orthovisc for RIGHT knee OA.  

## 2023-12-10 NOTE — Telephone Encounter (Signed)
Insurance updated to [employer], VOB re-submitted.

## 2023-12-10 NOTE — Telephone Encounter (Signed)
BCBS [place] policy has termed.   Update insurance

## 2023-12-12 NOTE — Telephone Encounter (Signed)
Medical Buy and Annette Stable - Prior Authorization REQUIRED  Pharmacy Benefit - Prior Authorization REQUIRED

## 2023-12-13 NOTE — Telephone Encounter (Signed)
   Aetna 2025 Subscriber ID:[id number]  Group number: [id number]  Insurance updated, VOB re-submitted

## 2023-12-16 NOTE — Telephone Encounter (Signed)
 Medical Buy and Annette Stable - Prior Authorization REQUIRED  Pharmacy Benefit - Prior Authorization REQUIRED

## 2023-12-17 NOTE — Telephone Encounter (Addendum)
Medical Buy and Annette Stable  Prior Authorization initiated for [facility] via Availity/[employer] Case ID: [id number]

## 2023-12-17 NOTE — Telephone Encounter (Addendum)
 Medical Buy and Annette Stable  Prior Authorization NOT required for [employer]

## 2023-12-17 NOTE — Telephone Encounter (Signed)
Pharmacy Benefit  Prior Authorization initiated for [facility] via Availity/[employer] Case ID: [id number]

## 2023-12-17 NOTE — Telephone Encounter (Signed)
Pharmacy benefit  Prior Auth initiated via [employer] PA# [id number]

## 2023-12-17 NOTE — Telephone Encounter (Signed)
Patient would like to proceed with the Orthovisc injection when approval is back.

## 2023-12-18 NOTE — Telephone Encounter (Signed)
 [name]  for RIGHT knee OA   Medical Buy and Bill  Primary Insurance: Aetna [facility] Co-pay: $80 Co-insurance: 20% Deductible: $0 of $1,250 met - must be met for coverage to apply Prior Auth: NOT required  Pharmacy Benefit   Coverage DENIED  Needs trial/failure/intolerance to TWO standard release steroids   Knee Injection History 02/14/23 - Kenalog  - RIGHT 07/01/23 - Kenalog  RIGHT

## 2023-12-18 NOTE — Telephone Encounter (Signed)
 Medical Buy and Raenette Bumps - Prior Auth NOT required  Pharmacy Benefit - Prior Auth DENIED

## 2023-12-18 NOTE — Telephone Encounter (Signed)
 Patient would like Orthovisc. See other phone note.  Holding for authorization.

## 2023-12-24 NOTE — Telephone Encounter (Signed)
Availity portal down.

## 2023-12-25 NOTE — Telephone Encounter (Addendum)
Medical Buy and Bill   Prior Auth voided - note sent to [facility] for approval under Medical benefits in addition to pharmacy benefits.    Pharmacy Benefit  Prior Authorization for Orthovisc APPROVED PA# [id number] Valid: 12/17/23-12/15/24

## 2023-12-27 NOTE — Telephone Encounter (Signed)
Patient called back to follow up on the status of approval.

## 2023-12-27 NOTE — Telephone Encounter (Signed)
Patient called back again to follow up.  She is going on a trip in March and would like to have it done before her trip.  I advised that we would follow up with the status of the approval and contact her to schedule.

## 2023-12-30 NOTE — Telephone Encounter (Signed)
 ORTHOVISC for RIGHT knee OA   Medical Benefit  Primary Insurance: Aetna [facility] Co-pay: $45 Co-insurance: n/a Deductible: does not apply Prior Auth: APPROVED PA# [id number] Valid: 12/17/23-12/15/24  Pharmacy Benefit  PBM: [employer]  Co-pay:  Co-insurance:  Deductible:  Prior Auth: Formulary Exception    Knee Injection History 02/14/23 - Kenalog - RIGHT 07/01/23 - Kenalog RIGHT

## 2023-12-30 NOTE — Telephone Encounter (Signed)
 [employer], spoke with [name], call reference # [id number] EST; advised of the following:  Medical Gretchen Portela  PA# [id number] Valid: 12/17/23-12/15/24

## 2023-12-30 NOTE — Telephone Encounter (Signed)
Scheduled and ordered.

## 2024-01-02 NOTE — Progress Notes (Unsigned)
   Rubin Payor, PhD, LAT, ATC acting as a scribe for [name], MD.  Marolyn Urschel is a 58 y.o. female who presents to [facility] today for cont'd R knee pain. Pt was last seen by Dr. Denyse Amass on 07/01/23 and was given a R knee steroid injection and was advised to cont working on weight loss.  Today, pt reports R knee pain returned mid-Jan. She is wanting to start the gel shots today.  Dx imaging: 02/14/23 R knee XR  Pertinent review of systems: No fevers or chills  Relevant historical information: Diabetes   Exam:  BP (!) 142/84   Pulse 73   Ht 5' 5.5" (1.664 m)   Wt 285 lb (129.3 kg)   SpO2 98%   BMI 46.71 kg/m  General: Well Developed, well nourished, and in no acute distress.   MSK: Right knee mild effusion normal motion with crepitation.    Lab and Radiology Results  Zaraya Delauder presents to clinic today for Orthovisc injection right knee 1/3 Procedure: Real-time Ultrasound Guided Injection of right knee joint superior lateral patellar space Device: Philips Affiniti 50G/GE Logiq Images permanently stored and available for review in PACS Verbal informed consent obtained.  Discussed risks and benefits of procedure. Warned about infection, bleeding, damage to structures among others. Patient expresses understanding and agreement Time-out conducted.   Noted no overlying erythema, induration, or other signs of local infection.   Skin prepped in a sterile fashion.   Local anesthesia: Topical Ethyl chloride.   With sterile technique and under real time ultrasound guidance: Orthovisc 30 mg injected into knee joint. Fluid seen entering the joint capsule.   Completed without difficulty   Advised to call if fevers/chills, erythema, induration, drainage, or persistent bleeding.   Images permanently stored and available for review in the ultrasound unit.  Impression: Technically successful ultrasound guided injection. Lot number:  [id number]      Assessment and Plan: 58 y.o. female with right knee pain due to DJD.  Plan for gel injection series starting today.  Return in 1 week for Orthovisc injection right knee 2/3.   PDMP not reviewed this encounter. Orders Placed This Encounter  Procedures   [place] LIMITED JOINT SPACE STRUCTURES LOW RIGHT(NO LINKED CHARGES)    Reason for Exam (SYMPTOM  OR DIAGNOSIS REQUIRED):   right knee pain    Preferred imaging location?:   [facility]   Meds ordered this encounter  Medications   Hyaluronan (ORTHOVISC) intra-articular injection 30 mg     Discussed warning signs or symptoms. Please see discharge instructions. Patient expresses understanding.   The above documentation has been reviewed and is accurate and complete [name], M.D.

## 2024-01-03 ENCOUNTER — Other Ambulatory Visit: Payer: Self-pay

## 2024-01-03 ENCOUNTER — Ambulatory Visit: Payer: 59 | Admitting: Family Medicine

## 2024-01-03 VITALS — BP 142/84 | HR 73 | Ht 65.5 in | Wt 285.0 lb

## 2024-01-03 DIAGNOSIS — M25561 Pain in right knee: Secondary | ICD-10-CM

## 2024-01-03 DIAGNOSIS — M1711 Unilateral primary osteoarthritis, right knee: Secondary | ICD-10-CM | POA: Diagnosis not present

## 2024-01-03 DIAGNOSIS — G8929 Other chronic pain: Secondary | ICD-10-CM

## 2024-01-03 MED ORDER — HYALURONAN 30 MG/2ML IX SOSY
30.0000 mg | PREFILLED_SYRINGE | Freq: Once | INTRA_ARTICULAR | Status: AC
Start: 2024-01-03 — End: 2024-01-03
  Administered 2024-01-03: 30 mg via INTRA_ARTICULAR

## 2024-01-03 NOTE — Patient Instructions (Signed)
 Thank you for coming in today.   You received an injection today. Seek immediate medical attention if the joint becomes red, extremely painful, or is oozing fluid.   Schedule the 2nd Orthovisc shot for next week and the 3rd for the week after that.

## 2024-01-07 ENCOUNTER — Other Ambulatory Visit: Payer: Self-pay | Admitting: Student

## 2024-01-07 DIAGNOSIS — E1165 Type 2 diabetes mellitus with hyperglycemia: Secondary | ICD-10-CM

## 2024-01-09 ENCOUNTER — Encounter: Payer: Self-pay | Admitting: Pharmacist

## 2024-01-09 ENCOUNTER — Ambulatory Visit: Payer: 59 | Admitting: Pharmacist

## 2024-01-09 VITALS — BP 134/71 | HR 76 | Wt 282.4 lb

## 2024-01-09 DIAGNOSIS — E785 Hyperlipidemia, unspecified: Secondary | ICD-10-CM

## 2024-01-09 DIAGNOSIS — E782 Mixed hyperlipidemia: Secondary | ICD-10-CM

## 2024-01-09 DIAGNOSIS — J309 Allergic rhinitis, unspecified: Secondary | ICD-10-CM

## 2024-01-09 DIAGNOSIS — E1165 Type 2 diabetes mellitus with hyperglycemia: Secondary | ICD-10-CM

## 2024-01-09 DIAGNOSIS — Z7985 Long-term (current) use of injectable non-insulin antidiabetic drugs: Secondary | ICD-10-CM

## 2024-01-09 DIAGNOSIS — E1169 Type 2 diabetes mellitus with other specified complication: Secondary | ICD-10-CM

## 2024-01-09 LAB — POCT GLYCOSYLATED HEMOGLOBIN (HGB A1C): HbA1c, POC (controlled diabetic range): 7.9 % — AB (ref 0.0–7.0)

## 2024-01-09 MED ORDER — ATORVASTATIN CALCIUM 40 MG PO TABS: 40.0000 mg | ORAL_TABLET | Freq: Every day | ORAL | 11 refills | Status: AC

## 2024-01-09 MED ORDER — MOUNJARO 12.5 MG/0.5ML ~~LOC~~ SOAJ: 12.5000 mg | SUBCUTANEOUS | 4 refills | Status: DC

## 2024-01-09 MED ORDER — CETIRIZINE HCL 10 MG PO TABS: 10.0000 mg | ORAL_TABLET | Freq: Every day | ORAL | 11 refills | Status: AC

## 2024-01-09 NOTE — Patient Instructions (Addendum)
 It was nice to see you today!  Your goal blood sugar is 80-130 before eating and less than 180 after eating.  Medication Changes: START atorvastatin (Lipitor) 40 mg daily  Increase Mounjaro (tirzepatide) from 10 mg to 12.5 mg weekly  Discontinue amlodipine 2.5 mg daily Discontinue pravastatin 40 mg daily  Continue all other medication the same.   Keep up the good work with diet and exercise. Aim for a diet full of vegetables, fruit and lean meats (chicken, [place], fish). Try to limit salt intake by eating fresh or frozen vegetables (instead of canned), rinse canned vegetables prior to cooking and do not add any additional salt to meals.

## 2024-01-09 NOTE — Progress Notes (Signed)
 S:     Chief Complaint  Patient presents with   Medication Management    DM - Libre 6 months   58 y.o. female who presents for diabetes evaluation, education, and management. Patient arrives in good spirits and presents without any assistance.   Patient was referred and last seen by Primary Care Provider, Dr. Elliot Gurney, on 07/05/23.   PMH is significant for diabetes, HTN, HLD At last visit, increased Mounjaro 10.0 mg weekly .   Patient reports Diabetes was diagnosed in ~2011 in Epic.   Family/Social History: strong family hx of HTN  Current diabetes medications include: metformin 1000 mg BID, empagliflozin (Jardiance) 25 mg once daily, tirzepatide (Mounjaro) 10 mg once weekly  Current hypertension medications include: amlodipine (2.5 mg) daily (patient not taking) Current hyperlipidemia medications include: pravastatin 40 mg daily  Patient reports adherence to taking all medications as prescribed. Patient reports not taking amlodipine 2.5 mg.   Do you feel that your medications are working for you? yes Have you been experiencing any side effects to the medications prescribed? No (reports nausea, but goes away) Do you have any problems obtaining medications due to transportation or finances? no Insurance coverage: Aetna  Patient denies hypoglycemic events.  Patient reported dietary habits: Eats 3 meals/day Breakfast: yogurt Lunch: salad, cheese pizza,  Dinner: chicken wings, tater tots Snacks: peanut butter crackers  O:   Review of Systems  All other systems reviewed and are negative.   Physical Exam Vitals reviewed.  Constitutional:      Appearance: Normal appearance.  Pulmonary:     Effort: Pulmonary effort is normal.  Neurological:     Mental Status: She is alert.     Gait: Gait abnormal.  Psychiatric:        Mood and Affect: Mood normal.        Behavior: Behavior normal.        Thought Content: Thought content normal.    Libre3 CGM Download today  02/14-25 - 01/09/24 % Time CGM is active: 14% Average Glucose: 202 mg/dL Glucose Management Indicator: --  Glucose Variability: 15.3% (goal <36%) Time in Goal:  - Time in range 70-180: 24% - Time above range: 76% - Time below range: 0%   Lab Results  Component Value Date   HGBA1C 7.9 (A) 01/09/2024   There were no vitals filed for this visit.  Lipid Panel     Component Value Date/Time   CHOL 181 07/05/2023 1702   TRIG 173 (H) 07/05/2023 1702   HDL 61 07/05/2023 1702   CHOLHDL 3.0 07/05/2023 1702   CHOLHDL 4.0 12/24/2016 1648   VLDL 52 (H) 12/24/2016 1648   LDLCALC 91 07/05/2023 1702   LDLDIRECT 150 (H) 05/10/2014 1128    Clinical Atherosclerotic Cardiovascular Disease (ASCVD): No  The 10-year ASCVD risk score (Arnett DK, et al., 2019) is: 16%   Values used to calculate the score:     Age: 62 years     Sex: Female     Is Non-Hispanic African American: Yes     Diabetic: Yes     Tobacco smoker: No     Systolic Blood Pressure: 142 mmHg     Is BP treated: Yes     HDL Cholesterol: 61 mg/dL     Total Cholesterol: 181 mg/dL   A/P:  LIBERATE Study:  - Patient here for the Liberate 6 months study. -The new A1c value: 7.9%  Diabetes longstanding currently improved and has better control. Patient is able to verbalize  appropriate hypoglycemia management plan. Medication adherence appears good.   -Increased dose of GLP-1   Mounjaro (tirzepatide) from 10 mg to 12.5 mg weekly .  -Continued SGLT2-I  Jardiance (empagliflozin) 10 mg daily. Counseled on sick day rules. -Continued metformin 1000 mg BID.  -Patient educated on purpose, proper use, and potential adverse effects.  -Extensively discussed pathophysiology of diabetes, recommended lifestyle interventions, dietary effects on blood sugar control.  -Counseled on s/sx of and management of hypoglycemia.  -Next A1c anticipated May 2025.   ASCVD risk - primary prevention in patient with diabetes. Last LDL is 92 not at goal of  <86 mg/dL. ASCVD risk factors include age, diabetes, HLD, HTN and 10-year ASCVD risk score of 16.0%.  -Started atorvastatin 40 mg daily -Discontinued pravastatin 40 mg daily  Hypertension longstanding currently has better control. Blood pressure goal of <130/80 mmHg. Medication adherence good (patient reports never taking amlodipine 2.5 mg) .  -Discontinued amlodipine 2.5 mg daily.  Allergic Rhinitis -Patient reports symptoms of allergy such as congestion and running nose and reports trying OTC cetirizine and symptoms has been improved -Started cetirizine 10 mg tablet once daily as needed for seasonal allergy   Written patient instructions provided. Patient verbalized understanding of treatment plan.  Total time in face to face counseling 22 minutes.    Follow-up:  Pharmacist TBD PCP clinic visit in 03/25 with Dr. Elliot Gurney Patient seen with Lavona Mound, PharmD Candidate

## 2024-01-10 ENCOUNTER — Ambulatory Visit: Payer: 59 | Admitting: Family Medicine

## 2024-01-10 ENCOUNTER — Other Ambulatory Visit: Payer: Self-pay

## 2024-01-10 DIAGNOSIS — M1711 Unilateral primary osteoarthritis, right knee: Secondary | ICD-10-CM

## 2024-01-10 DIAGNOSIS — G8929 Other chronic pain: Secondary | ICD-10-CM

## 2024-01-10 DIAGNOSIS — M25561 Pain in right knee: Secondary | ICD-10-CM

## 2024-01-10 MED ORDER — HYALURONAN 30 MG/2ML IX SOSY
30.0000 mg | PREFILLED_SYRINGE | Freq: Once | INTRA_ARTICULAR | Status: AC
  Administered 2024-01-10: 30 mg via INTRA_ARTICULAR

## 2024-01-10 NOTE — Progress Notes (Signed)
 Erin Schultz presents to clinic today for Orthovisc injection right knee 2/3 Procedure: Real-time Ultrasound Guided Injection of right knee joint superior lateral patellar space Device: Philips Affiniti 50G/GE Logiq Images permanently stored and available for review in PACS Verbal informed consent obtained.  Discussed risks and benefits of procedure. Warned about infection, bleeding, damage to structures among others. Patient expresses understanding and agreement Time-out conducted.   Noted no overlying erythema, induration, or other signs of local infection.   Skin prepped in a sterile fashion.   Local anesthesia: Topical Ethyl chloride.   With sterile technique and under real time ultrasound guidance: Orthovisc 30 mg injected into knee joint. Fluid seen entering the joint capsule.   Completed without difficulty   Advised to call if fevers/chills, erythema, induration, drainage, or persistent bleeding.   Images permanently stored and available for review in the ultrasound unit.  Impression: Technically successful ultrasound guided injection. Lot number: [id number] Return in 1 week for Orthovisc injection right knee 3/3

## 2024-01-10 NOTE — Patient Instructions (Addendum)
 Thank you for coming in today.  You received an injection today. Seek immediate medical attention if the joint becomes red, extremely painful, or is oozing fluid.  We will see you next week for #3

## 2024-01-13 NOTE — Progress Notes (Signed)
 Reviewed and agree with Dr Macky Lower plan.

## 2024-01-17 ENCOUNTER — Ambulatory Visit: Payer: 59 | Admitting: Family Medicine

## 2024-01-17 ENCOUNTER — Other Ambulatory Visit: Payer: Self-pay

## 2024-01-17 DIAGNOSIS — G8929 Other chronic pain: Secondary | ICD-10-CM | POA: Diagnosis not present

## 2024-01-17 DIAGNOSIS — M1711 Unilateral primary osteoarthritis, right knee: Secondary | ICD-10-CM | POA: Diagnosis not present

## 2024-01-17 DIAGNOSIS — M25561 Pain in right knee: Secondary | ICD-10-CM

## 2024-01-17 MED ORDER — HYALURONAN 30 MG/2ML IX SOSY
30.0000 mg | PREFILLED_SYRINGE | Freq: Once | INTRA_ARTICULAR | Status: AC
  Administered 2024-01-17: 30 mg via INTRA_ARTICULAR

## 2024-01-17 NOTE — Progress Notes (Signed)
 Feliza Diven presents to clinic today for Orthovisc injection right knee 3/3 Procedure: Real-time Ultrasound Guided Injection of right knee joint superior lateral patellar space Device: Philips Affiniti 50G/GE Logiq Images permanently stored and available for review in PACS Verbal informed consent obtained.  Discussed risks and benefits of procedure. Warned about infection, bleeding, damage to structures among others. Patient expresses understanding and agreement Time-out conducted.   Noted no overlying erythema, induration, or other signs of local infection.   Skin prepped in a sterile fashion.   Local anesthesia: Topical Ethyl chloride.   With sterile technique and under real time ultrasound guidance: Orthovisc 30 mg injected into knee joint. Fluid seen entering the joint capsule.   Completed without difficulty   Advised to call if fevers/chills, erythema, induration, drainage, or persistent bleeding.   Images permanently stored and available for review in the ultrasound unit.  Impression: Technically successful ultrasound guided injection. Lot number: [id number] She still having a little bit of pain.  Next step if needed would probably be [name].  Based on denial letter from last year she will need 2 different steroid injections prior to [name].  She did have some hyperglycemia with previous cortisone injection.  Would use lower dose Depo-Medrol if needed.

## 2024-01-17 NOTE — Patient Instructions (Signed)
 Thank you for coming in today.   You received an injection today. Seek immediate medical attention if the joint becomes red, extremely painful, or is oozing fluid.   I can repeat the gel shots in 6 months if needed. Just let me know

## 2024-01-27 NOTE — Telephone Encounter (Signed)
 Orthovisc injection series RIGHT knee #1 01/02/23 #2 01/10/24 #3 01/17/24   [name], spoke with [name], call reference # [id number] EST; advised of the following:   Medical Gretchen Portela   PA# [id number] Valid: 12/17/23-12/15/24

## 2024-01-31 ENCOUNTER — Telehealth: Payer: Self-pay | Admitting: Family Medicine

## 2024-01-31 NOTE — Telephone Encounter (Signed)
 Pt completed gel series R knee 01/17/2024, does not feel much improvement and has limited range of motion. Pt states she has NOT tried [name] but is concerned about her blood sugar. Going out of town next Wednesday and would like to have something "effective" done before that trip. What would we recommend?

## 2024-01-31 NOTE — Telephone Encounter (Signed)
 Erin Schultz is the most effective treatment I can think to try.  We should try to get this authorized and give it ago.  It is less likely to cause blood sugar elevation than typical cortisone shots.

## 2024-02-03 NOTE — Telephone Encounter (Signed)
 Prior auth request for [place] faxed via fax machine.

## 2024-02-03 NOTE — Telephone Encounter (Signed)
 Please check benefits for [name].

## 2024-02-03 NOTE — Telephone Encounter (Signed)
 Monia Pouch policy does not have broad-based coverage for [name]) under this patient's plan as listed in their clinical policy. The policy can be viewed at the payer's website using Medical Policy Number: [id number]. The payer may cover [phone] Erin Schultz) after approval (OR an approved pre-determination). To determine if your patient might have coverage for [name], [name] recommends that the provider may initiate a predetermination with the plan. Please fax the attached pre-determination form and medical records to the [facility] Pre-Determination Department directly at [phone].

## 2024-02-04 ENCOUNTER — Ambulatory Visit: Payer: 59 | Admitting: Student

## 2024-02-04 NOTE — Telephone Encounter (Signed)
 Noted. I have the form in my waiting for response pile. I will keep a look out for approval or denial and inform patient.

## 2024-02-05 ENCOUNTER — Ambulatory Visit: Admitting: Family Medicine

## 2024-02-05 NOTE — Telephone Encounter (Signed)
 Erin Schultz

## 2024-02-05 NOTE — Telephone Encounter (Addendum)
 Called [employer]ligibility [employer] Benefits at [phone], spoke with Rocky Link, advised that no prior Berkley Harvey is required for [employer], [phone] with dx code M17.11 under medical benefits. Also advised that [id number] will be covered at 100%, deductible and copay do not apply.   Transferred the [employer]'s Specialty Medication Department, [phone], spoke with Terrace Arabia, advised No prior auth required for [place], [id number] under medical benefits. Recommended confirming with Eligibility and Benefits Dept, which I have already done. The call ended before I was provided with a call reference number.

## 2024-02-06 ENCOUNTER — Other Ambulatory Visit: Payer: Self-pay | Admitting: Student

## 2024-02-06 DIAGNOSIS — E1165 Type 2 diabetes mellitus with hyperglycemia: Secondary | ICD-10-CM

## 2024-02-11 ENCOUNTER — Ambulatory Visit: Admitting: Family Medicine

## 2024-02-11 ENCOUNTER — Other Ambulatory Visit: Payer: Self-pay

## 2024-02-11 DIAGNOSIS — M1711 Unilateral primary osteoarthritis, right knee: Secondary | ICD-10-CM | POA: Diagnosis not present

## 2024-02-11 DIAGNOSIS — M25561 Pain in right knee: Secondary | ICD-10-CM | POA: Diagnosis not present

## 2024-02-11 DIAGNOSIS — G8929 Other chronic pain: Secondary | ICD-10-CM

## 2024-02-11 MED ORDER — TRIAMCINOLONE ACETONIDE 32 MG IX SRER
32.0000 mg | Freq: Once | INTRA_ARTICULAR | Status: AC
  Administered 2024-02-11: 32 mg via INTRA_ARTICULAR

## 2024-02-11 NOTE — Progress Notes (Signed)
  [name] injection right knee Procedure: Real-time Ultrasound Guided Injection of right knee joint superior lateral patellar space Device: Philips Affiniti 50G Images permanently stored and available for review in PACS Verbal informed consent obtained.  Discussed risks and benefits of procedure. Warned about infection, hyperglycemia bleeding, damage to structures among others. Patient expresses understanding and agreement Time-out conducted.   Noted no overlying erythema, induration, or other signs of local infection.   Skin prepped in a sterile fashion.   Local anesthesia: Topical Ethyl chloride.   With sterile technique and under real time ultrasound guidance: [name] 32 mg injected into knee joint. Fluid seen entering the joint capsule.   Completed without difficulty   Advised to call if fevers/chills, erythema, induration, drainage, or persistent bleeding.   Images permanently stored and available for review in the ultrasound unit.  Impression: Technically successful ultrasound guided injection.  Lot number: [id number] If not effective consider total knee replacement or genicular artery embolization.  BMI is about 46 and she is losing weight with Mounjaro.  May be reasonable to wait to knee replacement if weight loss is still effective.

## 2024-02-11 NOTE — Patient Instructions (Addendum)
 Thank you for coming in today.   You received an injection today. Seek immediate medical attention if the joint becomes red, extremely painful, or is oozing fluid.

## 2024-02-12 ENCOUNTER — Other Ambulatory Visit: Payer: Self-pay | Admitting: Student

## 2024-02-12 ENCOUNTER — Ambulatory Visit: Admitting: Family Medicine

## 2024-02-12 DIAGNOSIS — Z1231 Encounter for screening mammogram for malignant neoplasm of breast: Secondary | ICD-10-CM

## 2024-02-13 ENCOUNTER — Other Ambulatory Visit: Payer: Self-pay | Admitting: Student

## 2024-02-25 ENCOUNTER — Ambulatory Visit
Admission: RE | Admit: 2024-02-25 | Discharge: 2024-02-25 | Disposition: A | Discharge status: Home / Self Care | Source: Ambulatory Visit | Attending: *Deleted | Admitting: *Deleted

## 2024-02-25 DIAGNOSIS — Z1231 Encounter for screening mammogram for malignant neoplasm of breast: Secondary | ICD-10-CM

## 2024-03-08 ENCOUNTER — Other Ambulatory Visit: Payer: Self-pay | Admitting: Student

## 2024-03-08 DIAGNOSIS — E1165 Type 2 diabetes mellitus with hyperglycemia: Secondary | ICD-10-CM

## 2024-03-23 ENCOUNTER — Telehealth: Payer: Self-pay

## 2024-03-23 NOTE — Telephone Encounter (Signed)
 Printed the visit notes, imaging results, and prior auth attempt for appeal / reconsideration.

## 2024-04-14 NOTE — Telephone Encounter (Signed)
 Erin Schultz,  I placed the appeal documentation on your desk.

## 2024-05-04 ENCOUNTER — Telehealth: Payer: Self-pay | Admitting: Family Medicine

## 2024-05-04 NOTE — Telephone Encounter (Signed)
 Patient called and stated that her right knee is in a lot of pain. She was thinking the last injection should last until July but she is having a lot of pain right now. Patient asked if Dr. Joane could prescribe something like Prednisone for the pain. Please advise.

## 2024-05-04 NOTE — Telephone Encounter (Signed)
 Forwarding to Dr. Denyse Amass to review and advise.

## 2024-05-05 NOTE — Telephone Encounter (Signed)
 I cannot use prednisone with not well-controlled diabetes unless is life-threatening.  I recommend referral to interventional radiology for genicular artery embolization discussion.  If she is agreeable okay to place the order.

## 2024-05-05 NOTE — Telephone Encounter (Signed)
 Called and spoke with patient, advise per Dr. Joane. Pt verbalized understanding. I will send link to [facility] website about GAE.

## 2024-05-11 ENCOUNTER — Telehealth: Payer: Self-pay | Admitting: Family Medicine

## 2024-05-11 NOTE — Telephone Encounter (Signed)
 Pt scheduled for R knee injection 05/14/2024. She does not want to repeat [name]  as she did not think it lasted. Pt presumes regular steroids but pt does have concerns about blood sugar. Last [name]  02/11/2024.  Just FYI for appropriate scheduling.

## 2024-05-11 NOTE — Telephone Encounter (Signed)
 Forwarding to Dr. Joane to review.   A1C 01/09/24:  Component Ref Range & Units (hover) 4 mo ago (01/09/24) 5 mo ago (11/21/23) 9 mo ago (08/07/23) 10 mo ago (07/05/23) 1 yr ago (09/03/22) 2 yr ago (03/26/22) 2 yr ago (06/16/21)  Hemoglobin A1C         HbA1c POC (<> result, manual entry)         HbA1c, POC (prediabetic range)         HbA1c, POC (controlled diabetic range) 7.9 Abnormal  7.6 Abnormal  9.0 Abnormal  9.0 Abnormal  8.8 Abnormal  8.3 Abnormal  8.6 Abnormal

## 2024-05-12 NOTE — Telephone Encounter (Signed)
 Regular cortisone shots can increase blood sugar worse than [name]  can.  Happy to do regular cortisone shots with an A1c of 7.9.

## 2024-05-13 NOTE — Progress Notes (Unsigned)
   LILLETTE Ileana Collet, PhD, LAT, ATC acting as a scribe for Artist Lloyd, MD.  Erin Schultz is a 58 y.o. female who presents to [facility] at [facility] today for cont'd R knee pain. Pt was last seen by Dr. Lloyd on 02/11/24 and R knee was injected w/ [name] . Pt completed the Orthovisc series, 3/3, on 01/17/24.  Today, pt reports ***  Dx imaging: 02/14/23 R knee XR   Pertinent review of systems: ***  Relevant historical information: ***   Exam:  There were no vitals taken for this visit. General: Well Developed, well nourished, and in no acute distress.   MSK: ***    Lab and Radiology Results No results found for this or any previous visit (from the past 72 hours). No results found.     Assessment and Plan: 58 y.o. female with ***   PDMP not reviewed this encounter. No orders of the defined types were placed in this encounter.  No orders of the defined types were placed in this encounter.    Discussed warning signs or symptoms. Please see discharge instructions. Patient expresses understanding.   ***

## 2024-05-14 ENCOUNTER — Encounter: Payer: Self-pay | Admitting: Family Medicine

## 2024-05-14 ENCOUNTER — Other Ambulatory Visit: Payer: Self-pay

## 2024-05-14 ENCOUNTER — Ambulatory Visit: Admitting: Family Medicine

## 2024-05-14 VITALS — BP 122/80 | HR 75 | Ht 65.5 in | Wt 286.0 lb

## 2024-05-14 DIAGNOSIS — Z6841 Body Mass Index (BMI) 40.0 and over, adult: Secondary | ICD-10-CM

## 2024-05-14 DIAGNOSIS — M25561 Pain in right knee: Secondary | ICD-10-CM

## 2024-05-14 DIAGNOSIS — M1711 Unilateral primary osteoarthritis, right knee: Secondary | ICD-10-CM | POA: Diagnosis not present

## 2024-05-14 DIAGNOSIS — G8929 Other chronic pain: Secondary | ICD-10-CM | POA: Diagnosis not present

## 2024-05-14 NOTE — Patient Instructions (Addendum)
 Thank you for coming in today.   You received an injection today. Seek immediate medical attention if the joint becomes red, extremely painful, or is oozing fluid.   Information provided for Genicular Artery Embolization  Referral placed for consult for Bariatric Surgery

## 2024-05-20 ENCOUNTER — Other Ambulatory Visit: Payer: Self-pay | Admitting: Student

## 2024-05-20 DIAGNOSIS — E1165 Type 2 diabetes mellitus with hyperglycemia: Secondary | ICD-10-CM

## 2024-05-21 NOTE — Telephone Encounter (Signed)
 Refilled Metformin . Has follow up with PCP. Needs BMP at follow up GFR is at borderline for needing dose reducing of metformin  to 1500 mg.  FYI only Dr. Alvin has visit with you 7/16

## 2024-05-24 ENCOUNTER — Other Ambulatory Visit: Payer: Self-pay | Admitting: Family Medicine

## 2024-05-24 DIAGNOSIS — E1169 Type 2 diabetes mellitus with other specified complication: Secondary | ICD-10-CM

## 2024-05-28 NOTE — Progress Notes (Signed)
    SUBJECTIVE:   CHIEF COMPLAINT / HPI:   Patient presenting to discuss her diabetes, weight loss and blood pressure. She reports tolerating her medications well, with mild constipation and nausea that she manages w/ benefiber. Her weight loss is complicated by right knee pain secondary to osteoarthritis, for which she sees Dr. Joane.  She has been getting good relief from her most recent steroid injection, and is agreeable to increasing her walking and physical activity as a result.  Possible follow-up with interventional radiology for genicular nerve ablation per Dr. Joane. She's disappointed that her weight has not gone down and that her A1c went up compared to February, discussed realistic expectations and stepwise goals towards the end target of sustained weight loss and A1c less than 7.  Patient said that she is considering follow-up with bariatric surgeon per Dr. Joane, but is not interested in knee replacement as definitive management for her osteoarthritis.  PERTINENT  PMH / PSH: T2DM, HTN, R knee osteoarthritis, HLD, morbid obesity  OBJECTIVE:   BP 139/78   Pulse 83   Wt 283 lb 12.8 oz (128.7 kg)   SpO2 100%   BMI 46.51 kg/m   General: Awake, alert, no acute distress.  Communicates clearly. Cardio: Regular rate and rhythm, no murmurs rubs or gallops appreciated.  2+ radial and dorsalis pedis pulses bilaterally. Resp: Saltation bilaterally with normal work of breathing on room air Abdominal: Soft nontender nondistended  ASSESSMENT/PLAN:   Assessment & Plan Primary hypertension Patient's BP well controlled in clinic today.  Continue Losartan  100mg  daily -BMP today Poorly controlled diabetes mellitus (HCC) -A1c up from 2/25 7.9->8.6 in 7/25.  -Encouraged patient to increase daily activity level at baseline, as well as going from 2 to 3 days of water aerobics, with the goal of getting to 4 days eventually.  -Urine microalbumin, BMP, lipid panel today.  -Increase mounjaro  from  12.5 -> 15mg  weekly injection.     Leafy Scriver, DO [facility]

## 2024-05-29 ENCOUNTER — Ambulatory Visit (INDEPENDENT_AMBULATORY_CARE_PROVIDER_SITE_OTHER)

## 2024-05-29 VITALS — BP 139/78 | HR 83 | Wt 283.8 lb

## 2024-05-29 DIAGNOSIS — I10 Essential (primary) hypertension: Secondary | ICD-10-CM | POA: Diagnosis not present

## 2024-05-29 DIAGNOSIS — E1165 Type 2 diabetes mellitus with hyperglycemia: Secondary | ICD-10-CM | POA: Diagnosis not present

## 2024-05-29 DIAGNOSIS — E1169 Type 2 diabetes mellitus with other specified complication: Secondary | ICD-10-CM

## 2024-05-29 DIAGNOSIS — E782 Mixed hyperlipidemia: Secondary | ICD-10-CM | POA: Diagnosis not present

## 2024-05-29 LAB — POCT GLYCOSYLATED HEMOGLOBIN (HGB A1C): HbA1c, POC (controlled diabetic range): 8.6 % — AB (ref 0.0–7.0)

## 2024-05-29 NOTE — Assessment & Plan Note (Signed)
-  A1c up from 2/25 7.9->8.6 in 7/25.  -Encouraged patient to increase daily activity level at baseline, as well as going from 2 to 3 days of water aerobics, with the goal of getting to 4 days eventually.  -Urine microalbumin, BMP, lipid panel today.  -Increase mounjaro  from 12.5 -> 15mg  weekly injection.

## 2024-05-29 NOTE — Patient Instructions (Addendum)
 It was wonderful to meet you today.  Today we talked about your blood pressure, weight loss, and diabetes and A1c results.   Your BP was normal on recheck in the clinic, so we'll leave your medication as is for that now.   For your diabetes and weight loss, I'd like you to increase your days with physical activity to 4 out of every 7. This can be aerobics, walking, anything that gets your heart rate up and makes you breath harder. We can also raise your mounjaro  one more step to 15mg  per week from 12.5. This, in combination with good diet choices will help control your sugars and lead to more weight loss.    Please follow up in 3 months.   Thank you for choosing [facility].   Please call [phone] with any questions about today's appointment.  Please be sure to schedule follow up at the front  desk before you leave today.   Leafy Scriver, DO Family Medicine

## 2024-05-29 NOTE — Assessment & Plan Note (Signed)
 Patient's BP well controlled in clinic today.  Continue Losartan  100mg  daily -BMP today

## 2024-05-30 LAB — BASIC METABOLIC PANEL WITH GFR
BUN/Creatinine Ratio: 19 (ref 9–23)
BUN: 24 mg/dL (ref 6–24)
CO2: 21 mmol/L (ref 20–29)
Calcium: 10.4 mg/dL — ABNORMAL HIGH (ref 8.7–10.2)
Chloride: 103 mmol/L (ref 96–106)
Creatinine, Ser: 1.24 mg/dL — ABNORMAL HIGH (ref 0.57–1.00)
Glucose: 149 mg/dL — ABNORMAL HIGH (ref 70–99)
Potassium: 4.2 mmol/L (ref 3.5–5.2)
Sodium: 140 mmol/L (ref 134–144)
eGFR: 50 mL/min/1.73 — ABNORMAL LOW (ref >59)

## 2024-05-30 LAB — MICROALBUMIN / CREATININE URINE RATIO
Creatinine, Urine: 102.3 mg/dL
Microalb/Creat Ratio: 13 mg/g{creat} (ref 0–29)
Microalbumin, Urine: 13.3 ug/mL

## 2024-05-30 LAB — LIPID PANEL
Chol/HDL Ratio: 3.1 ratio (ref 0.0–4.4)
Cholesterol, Total: 186 mg/dL (ref 100–199)
HDL: 60 mg/dL (ref >39)
LDL Chol Calc (NIH): 101 mg/dL — ABNORMAL HIGH (ref 0–99)
Triglycerides: 142 mg/dL (ref 0–149)
VLDL Cholesterol Cal: 25 mg/dL (ref 5–40)

## 2024-06-02 ENCOUNTER — Ambulatory Visit: Payer: Self-pay

## 2024-06-02 MED ORDER — MOUNJARO 15 MG/0.5ML ~~LOC~~ SOAJ: 15.0000 mg | SUBCUTANEOUS | 1 refills | Status: DC

## 2024-06-02 NOTE — Addendum Note (Signed)
 Addended by: MANON JESTER on: 06/02/2024 11:22 PM   Modules accepted: Orders

## 2024-06-25 ENCOUNTER — Other Ambulatory Visit: Payer: Self-pay | Admitting: Family Medicine

## 2024-06-25 NOTE — Telephone Encounter (Signed)
 Called patient.  She reports she is taking Mounjaro 15mg  weekly.   VM left at the pharmacy to take this off of her profile.

## 2024-07-02 ENCOUNTER — Other Ambulatory Visit: Payer: Self-pay

## 2024-07-02 DIAGNOSIS — E1165 Type 2 diabetes mellitus with hyperglycemia: Secondary | ICD-10-CM

## 2024-07-03 MED ORDER — METFORMIN HCL 1000 MG PO TABS: 1000.0000 mg | ORAL_TABLET | Freq: Two times a day (BID) | ORAL | 3 refills | Status: DC

## 2024-07-12 ENCOUNTER — Other Ambulatory Visit: Payer: Self-pay | Admitting: Family Medicine

## 2024-07-29 ENCOUNTER — Other Ambulatory Visit: Payer: Self-pay | Admitting: Student

## 2024-07-29 ENCOUNTER — Other Ambulatory Visit: Payer: Self-pay | Admitting: Family Medicine

## 2024-07-29 DIAGNOSIS — I10 Essential (primary) hypertension: Secondary | ICD-10-CM

## 2024-07-29 DIAGNOSIS — E1165 Type 2 diabetes mellitus with hyperglycemia: Secondary | ICD-10-CM

## 2024-07-31 MED ORDER — LOSARTAN POTASSIUM 100 MG PO TABS
100.0000 mg | ORAL_TABLET | Freq: Every day | ORAL | 3 refills | Status: AC
Start: 2024-07-31 — End: ?

## 2024-08-24 ENCOUNTER — Telehealth: Payer: Self-pay | Admitting: Family Medicine

## 2024-08-24 DIAGNOSIS — G8929 Other chronic pain: Secondary | ICD-10-CM

## 2024-08-24 DIAGNOSIS — M1711 Unilateral primary osteoarthritis, right knee: Secondary | ICD-10-CM

## 2024-08-24 NOTE — Telephone Encounter (Signed)
 Pt would like to be referred for the Genicular Artery Embolization   R knee.

## 2024-08-25 NOTE — Telephone Encounter (Signed)
 Order placed for consult for GAE.

## 2024-08-25 NOTE — Addendum Note (Signed)
 Addended by: MARDY LEOTIS RAMAN on: 08/25/2024 10:46 AM   Modules accepted: Orders

## 2024-08-26 ENCOUNTER — Other Ambulatory Visit: Payer: Self-pay | Admitting: Family Medicine

## 2024-08-26 ENCOUNTER — Other Ambulatory Visit: Payer: Self-pay | Admitting: Student

## 2024-08-26 NOTE — Progress Notes (Signed)
 Chief Complaint: Patient was seen in consultation today for right knee pain.   Referring Physician(s): Corey,Evan S  History of Present Illness: Erin Schultz is a 58 y.o. female with a medical history significant for CKD, DM, HTN, small bowel obstruction, acute peritonitis and obesity. 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 Her right knee has been treated with steroid injections, the Orthovisc series and [name] . She felt some relief with the [name]  injections but not enough to comfortably perform her activities of daily living. She is currently taking Mounjaro  for weight loss but until her BMI is less than 40 she is unable to qualify for knee surgery. Dr. Joane believes she has exhausted conservative management options and he has referred her to Interventional Radiology for geniculate artery embolization.  Her pain has been present for years.  Her primary complaint is inability to walk at a normal pace or stand for longer than 10-15 minutes without significant pain.  Her pain waxes and wanes, with some days worse than others.  She wears a knee brace occasionally which helps her pain marginally.  She takes tylenol  which also helps her pain.  She is doing water aerobics currently in attempts to loose weight which she enjoys but has been relatively unsuccessful with.  [place] Pain Score = 25/96 VAS Pain Score = 6/10   Past Medical History:  Diagnosis Date   Acute peritonitis (HCC) 12/20/2012   Acute respiratory failure with hypoxia (HCC) 12/20/2012   Chronic kidney disease    Diabetes mellitus without complication (HCC)    H/O hiatal hernia    Hypertension    Septic shock(785.52) 12/20/2012   Severe sepsis(995.92) 12/20/2012   Small bowel obstruction (HCC) 12/20/2012    Past Surgical History:  Procedure Laterality Date   ABDOMINAL HYSTERECTOMY     APPENDECTOMY     CHOLECYSTECTOMY     LAPAROTOMY  12/15/2012   Procedure: EXPLORATORY LAPAROTOMY;   Surgeon: Jina Nephew, MD;  Location: [facility] OR;  Service: General;  Laterality: N/A;   LAPAROTOMY N/A 12/19/2012   Procedure: EXPLORATORY LAPAROTOMY;  Surgeon: Elon CHRISTELLA Pacini, MD;  Location: [facility] OR;  Service: General;  Laterality: N/A;   VACUUM ASSISTED CLOSURE CHANGE  12/17/2012   Procedure: ABDOMINAL VACUUM ASSISTED CLOSURE CHANGE;  Surgeon: Elon CHRISTELLA Pacini, MD;  Location: [facility] OR;  Service: General;  Laterality: N/A;  EX Lap with vac change   VACUUM ASSISTED CLOSURE CHANGE N/A 12/19/2012   Procedure: ABDOMINAL VACUUM ASSISTED CLOSURE CHANGE;  Surgeon: Elon CHRISTELLA Pacini, MD;  Location: [facility] OR;  Service: General;  Laterality: N/A;   VACUUM ASSISTED CLOSURE CHANGE N/A 12/22/2012   Procedure: ABDOMINAL VACUUM ASSISTED CLOSURE CHANGE;  Surgeon: Alm VEAR Angle, MD;  Location: [facility] OR;  Service: General;  Laterality: N/A;   VACUUM ASSISTED CLOSURE CHANGE N/A 12/24/2012   Procedure: ABDOMINAL VACUUM ASSISTED CLOSURE CHANGE;  Surgeon: Alm VEAR Angle, MD;  Location: [facility] OR;  Service: General;  Laterality: N/A;    Allergies: Sulfa antibiotics  Medications: Prior to Admission medications   Medication Sig Start Date End Date Taking? Authorizing Provider  atorvastatin  (LIPITOR) 40 MG tablet Take 1 tablet (40 mg total) by mouth daily. 01/09/24   McDiarmid, Krystal BIRCH, MD  Blood Glucose Monitoring Suppl (ONE TOUCH ULTRA 2) w/Device KIT USE AS DIRECTED 11/21/19   Delane Lye, MD  cetirizine  (ZYRTEC ) 10 MG tablet Take 1 tablet (10 mg total) by mouth daily. 01/09/24   McDiarmid, Krystal BIRCH, MD  JARDIANCE  25  MG TABS tablet TAKE 1 TABLET (25 MG TOTAL) BY MOUTH DAILY. 07/31/24   Manon Jester, DO  losartan  (COZAAR ) 100 MG tablet Take 1 tablet (100 mg total) by mouth daily. 07/31/24   Manon Jester, DO  Magnesium 100 MG CAPS Take 1 capsule by mouth daily at 6 (six) AM.    [provider]  metFORMIN  (GLUCOPHAGE ) 1000 MG tablet Take 1 tablet (1,000 mg total) by mouth 2 (two) times daily with a meal. 07/03/24   Manon Jester, DO  Multiple Vitamins-Minerals (MULTI-VITAMIN GUMMIES) CHEW Chew by mouth.    [provider]  [facility] DELICA LANCETS FINE MISC 1 application by Does not apply route 3 (three) times daily. 01/03/17   McKeag, Chauncey BIRCH, MD  ONETOUCH ULTRA test strip USE THREE TIMES DAILY BEFORE BREAKFAST AND AFTER MEALS. 03/26/22   Rosendo Rush, MD  polyethylene glycol (MIRALAX / GLYCOLAX) 17 g packet Take 17 g by mouth daily.    [provider]  pravastatin  (PRAVACHOL ) 40 MG tablet TAKE 1 TABLET BY MOUTH EVERYDAY AT BEDTIME 08/26/24   Manon Jester, DO  tirzepatide  (MOUNJARO ) 15 MG/0.5ML Pen Inject 15 mg into the skin once a week. 06/02/24   Manon Jester, DO  UNIFINE PENTIPS PLUS 31G X 6 MM MISC USE AS DIRECTED 01/12/19   Ladell Lenis, DO  OZEMPIC , 1 MG/DOSE, 4 MG/3ML SOPN INJECT 2 MG INTO THE SKIN ONCE A WEEK. 05/28/22   Rosendo Rush, MD     Family History  Problem Relation Age of Onset   Diabetes Father    Hypertension Father    Diabetes Brother    Colon polyps Neg Hx    Colon cancer Neg Hx    Breast cancer Neg Hx     Social History   Socioeconomic History   Marital status: Single    Spouse name: Not on file   Number of children: Not on file   Years of education: Not on file   Highest education level: Not on file  Occupational History   Not on file  Tobacco Use   Smoking status: Never   Smokeless tobacco: Never  Substance and Sexual Activity   Alcohol use: No   Drug use: No   Sexual activity: Not on file  Other Topics Concern   Not on file  Social History Narrative   Not on file   Social Drivers of Health   Financial Resource Strain: Not on file  Food Insecurity: Not on file  Transportation Needs: Not on file  Physical Activity: Not on file  Stress: Not on file  Social Connections: Not on file    Review of Systems: A 12 point ROS discussed and pertinent positives are indicated in the HPI above.  All other systems are negative.  Vital Signs: There  were no vitals taken for this visit.   Physical Exam Constitutional:      General: She is not in acute distress. HENT:     Head: Normocephalic.     Mouth/Throat:     Mouth: Mucous membranes are moist.  Eyes:     General: No scleral icterus. Cardiovascular:     Rate and Rhythm: Normal rate and regular rhythm.  Pulmonary:     Effort: No respiratory distress.  Abdominal:     General: There is no distension.  Musculoskeletal:     Right lower leg: No edema.     Left lower leg: No edema.       Legs:     Comments: Tender  to palpation  Skin:    General: Skin is warm and dry.  Neurological:     Mental Status: She is alert and oriented to person, place, and time.     Imaging:  Right Knee X-ray 02/14/23   Kellgren and Jerilynn Grade III  Labs:  CBC: No results for input(s): WBC, HGB, HCT, PLT in the last 8760 hours.  COAGS: No results for input(s): INR, APTT in the last 8760 hours.  BMP: Recent Labs    05/29/24 1616  NA 140  K 4.2  CL 103  CO2 21  GLUCOSE 149*  BUN 24  CALCIUM  10.4*  CREATININE 1.24*    LIVER FUNCTION TESTS: No results for input(s): BILITOT, AST, ALT, ALKPHOS, PROT, ALBUMIN  in the last 8760 hours.  TUMOR MARKERS: No results for input(s): AFPTM, CEA, CA199, CHROMGRNA in the last 8760 hours.  Assessment and Plan: 58 year old female with a history of chronic right knee pain (WOMAC 25/96) secondary to advanced osteoarthritis (K&G 3) that has been unresponsive to conservative measures.  She would be an excellent candidate for geniculate artery embolization.  We discussed the rationale, periprocedural expectations, and expected outcomes after geniculate artery embolization.  She would like to proceed.  Plan for right geniculate artery embolization via retrograde left pedal artery access with moderate sedation at [facility].    Ester Sides, MD Pager: [phone]    I spent a total of  40 Minutes   in  face to face in clinical consultation, greater than 50% of which was counseling/coordinating care for right knee pain.

## 2024-08-28 ENCOUNTER — Ambulatory Visit
Admission: RE | Admit: 2024-08-28 | Discharge: 2024-08-28 | Disposition: A | Source: Ambulatory Visit | Attending: Family Medicine | Admitting: Family Medicine

## 2024-08-28 DIAGNOSIS — M1711 Unilateral primary osteoarthritis, right knee: Secondary | ICD-10-CM

## 2024-08-28 DIAGNOSIS — G8929 Other chronic pain: Secondary | ICD-10-CM

## 2024-08-28 HISTORY — PX: IR RADIOLOGIST EVAL & MGMT: IMG5224

## 2024-09-02 ENCOUNTER — Other Ambulatory Visit: Payer: Self-pay | Admitting: Interventional Radiology

## 2024-09-02 DIAGNOSIS — M1711 Unilateral primary osteoarthritis, right knee: Secondary | ICD-10-CM

## 2024-09-23 ENCOUNTER — Telehealth: Payer: Self-pay

## 2024-09-23 MED ORDER — METHYLPREDNISOLONE 4 MG PO TBPK: ORAL_TABLET | ORAL | 0 refills | Status: AC

## 2024-09-23 NOTE — Discharge Instructions (Signed)

## 2024-09-23 NOTE — Progress Notes (Signed)
 See telephone note

## 2024-09-23 NOTE — Progress Notes (Signed)
 Pt returned call, pharmacy confirmed, RX e-scribed

## 2024-09-23 NOTE — Progress Notes (Signed)
 Chief Complaint: Patient was seen in consultation today for right knee pain   Referring Physician(s): Dr. Artist Lloyd  Patient Status: [facility] - Outpatient   History of Present Illness: Erin Schultz is a 58 y.o. female with a medical history significant for CKD, DM, HTN, small bowel obstruction, acute peritonitis and obesity. 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 Her right knee has been treated with steroid injections, the Orthovisc series and [name] . She felt some relief with the [name]  injections but not enough to comfortably perform her activities of daily living. She is currently taking Mounjaro  for weight loss but until her BMI is less than 40 she is unable to qualify for knee surgery. Dr. Lloyd believes she has exhausted conservative management options and referred her to Interventional Radiology for geniculate artery embolization. She presented for consultation 08/28/24.  She reported that her pain has been present for years. Her primary complaint is inability to walk at a normal pace or stand for longer than 10-15 minutes without significant pain. Her pain waxes and wanes, with some days worse than others.  She wears a knee brace occasionally which helps her pain marginally.  She takes tylenol  which also helps her pain.  She is doing water aerobics currently in attempts to loose weight which she enjoys but has been relatively unsuccessful with.   We discussed the rationale, periprocedural expectations, and expected outcomes after geniculate artery embolization. She would like to proceed.   Past Medical History:  Diagnosis Date   Acute peritonitis (HCC) 12/20/2012   Acute respiratory failure with hypoxia (HCC) 12/20/2012   Chronic kidney disease    Diabetes mellitus without complication (HCC)    H/O hiatal hernia    Hypertension    Septic shock(785.52) 12/20/2012   Severe sepsis(995.92) 12/20/2012   Small bowel obstruction (HCC)  12/20/2012    Past Surgical History:  Procedure Laterality Date   ABDOMINAL HYSTERECTOMY     APPENDECTOMY     CHOLECYSTECTOMY     IR RADIOLOGIST EVAL & MGMT  08/28/2024   LAPAROTOMY  12/15/2012   Procedure: EXPLORATORY LAPAROTOMY;  Surgeon: Jina Nephew, MD;  Location: [facility];  Service: General;  Laterality: N/A;   LAPAROTOMY N/A 12/19/2012   Procedure: EXPLORATORY LAPAROTOMY;  Surgeon: Elon CHRISTELLA Pacini, MD;  Location: [facility];  Service: General;  Laterality: N/A;   VACUUM ASSISTED CLOSURE CHANGE  12/17/2012   Procedure: ABDOMINAL VACUUM ASSISTED CLOSURE CHANGE;  Surgeon: Elon CHRISTELLA Pacini, MD;  Location: [facility];  Service: General;  Laterality: N/A;  EX Lap with vac change   VACUUM ASSISTED CLOSURE CHANGE N/A 12/19/2012   Procedure: ABDOMINAL VACUUM ASSISTED CLOSURE CHANGE;  Surgeon: Elon CHRISTELLA Pacini, MD;  Location: [facility];  Service: General;  Laterality: N/A;   VACUUM ASSISTED CLOSURE CHANGE N/A 12/22/2012   Procedure: ABDOMINAL VACUUM ASSISTED CLOSURE CHANGE;  Surgeon: Alm VEAR Angle, MD;  Location: [facility];  Service: General;  Laterality: N/A;   VACUUM ASSISTED CLOSURE CHANGE N/A 12/24/2012   Procedure: ABDOMINAL VACUUM ASSISTED CLOSURE CHANGE;  Surgeon: Alm VEAR Angle, MD;  Location: [facility];  Service: General;  Laterality: N/A;    Allergies: Sulfa antibiotics  Medications: Prior to Admission medications   Medication Sig Start Date End Date Taking? Authorizing Provider  atorvastatin  (LIPITOR) 40 MG tablet Take 1 tablet (40 mg total) by mouth daily. 01/09/24   McDiarmid, Krystal BIRCH, MD  Blood Glucose Monitoring Suppl (ONE TOUCH ULTRA 2) w/Device KIT USE AS  DIRECTED 11/21/19   Delane Lye, MD  cetirizine  (ZYRTEC ) 10 MG tablet Take 1 tablet (10 mg total) by mouth daily. 01/09/24   McDiarmid, Krystal BIRCH, MD  JARDIANCE  25 MG TABS tablet TAKE 1 TABLET (25 MG TOTAL) BY MOUTH DAILY. 07/31/24   Manon Jester, DO  losartan  (COZAAR ) 100 MG tablet Take 1 tablet (100 mg total) by mouth daily. 07/31/24   Manon Jester,  DO  Magnesium 100 MG CAPS Take 1 capsule by mouth daily at 6 (six) AM.    [provider]  metFORMIN  (GLUCOPHAGE ) 1000 MG tablet Take 1 tablet (1,000 mg total) by mouth 2 (two) times daily with a meal. 07/03/24   Manon Jester, DO  methylPREDNISolone (MEDROL DOSEPAK) 4 MG TBPK tablet Take as prescribed by pharmacy 09/25/24   Jennefer Ester PARAS, MD  Multiple Vitamins-Minerals (MULTI-VITAMIN GUMMIES) CHEW Chew by mouth.    [provider]  [facility] DELICA LANCETS FINE MISC 1 application by Does not apply route 3 (three) times daily. 01/03/17   McKeag, Chauncey BIRCH, MD  ONETOUCH ULTRA test strip USE THREE TIMES DAILY BEFORE BREAKFAST AND AFTER MEALS. 03/26/22   Rosendo Rush, MD  polyethylene glycol (MIRALAX / GLYCOLAX) 17 g packet Take 17 g by mouth daily.    [provider]  pravastatin  (PRAVACHOL ) 40 MG tablet TAKE 1 TABLET BY MOUTH EVERYDAY AT BEDTIME 08/26/24   Manon Jester, DO  tirzepatide  (MOUNJARO ) 15 MG/0.5ML Pen Inject 15 mg into the skin once a week. 06/02/24   Manon Jester, DO  UNIFINE PENTIPS PLUS 31G X 6 MM MISC USE AS DIRECTED 01/12/19   Ladell Lenis, DO  OZEMPIC , 1 MG/DOSE, 4 MG/3ML SOPN INJECT 2 MG INTO THE SKIN ONCE A WEEK. 05/28/22   Rosendo Rush, MD     Family History  Problem Relation Age of Onset   Diabetes Father    Hypertension Father    Diabetes Brother    Colon polyps Neg Hx    Colon cancer Neg Hx    Breast cancer Neg Hx     Social History   Socioeconomic History   Marital status: Single    Spouse name: Not on file   Number of children: Not on file   Years of education: Not on file   Highest education level: Not on file  Occupational History   Not on file  Tobacco Use   Smoking status: Never   Smokeless tobacco: Never  Substance and Sexual Activity   Alcohol use: No   Drug use: No   Sexual activity: Not on file  Other Topics Concern   Not on file  Social History Narrative   Not on file   Social Drivers of Health   Financial  Resource Strain: Not on file  Food Insecurity: Not on file  Transportation Needs: Not on file  Physical Activity: Not on file  Stress: Not on file  Social Connections: Not on file    Review of Systems: A 12 point ROS discussed and pertinent positives are indicated in the HPI above.  All other systems are negative.  Vital Signs: There were no vitals taken for this visit.  Physical Exam Constitutional:      General: She is not in acute distress. HENT:     Head: Normocephalic.     Mouth/Throat:     Mouth: Mucous membranes are moist.  Eyes:     General: No scleral icterus. Cardiovascular:     Rate and Rhythm: Normal rate and regular rhythm.  Pulmonary:  Effort: No respiratory distress.  Abdominal:     General: There is no distension.  Skin:    General: Skin is warm and dry.  Neurological:     Mental Status: She is alert and oriented to person, place, and time.     Imaging:  Right Knee X-ray 02/14/23    Kellgren and Jerilynn Grade III  Labs:  CBC: No results for input(s): WBC, HGB, HCT, PLT in the last 8760 hours.  COAGS: No results for input(s): INR, APTT in the last 8760 hours.  BMP: Recent Labs    05/29/24 1616  NA 140  K 4.2  CL 103  CO2 21  GLUCOSE 149*  BUN 24  CALCIUM  10.4*  CREATININE 1.24*    LIVER FUNCTION TESTS: No results for input(s): BILITOT, AST, ALT, ALKPHOS, PROT, ALBUMIN  in the last 8760 hours.  TUMOR MARKERS: No results for input(s): AFPTM, CEA, CA199, CHROMGRNA in the last 8760 hours.  Assessment and Plan:  Right Knee Pain: Dodson Norland, 58 year old female, presents today for an image-guided right geniculate artery embolization.   Risks and benefits of this procedure were discussed with the patient including, but not limited to bleeding, infection, vascular injury or contrast induced renal failure.  All of the patient's questions were answered, patient is agreeable to proceed. She has been  NPO.   Consent signed and in chart.   Ester Sides, MD Pager: [phone]

## 2024-09-25 ENCOUNTER — Ambulatory Visit
Admission: RE | Admit: 2024-09-25 | Discharge: 2024-09-25 | Disposition: A | Discharge status: Home / Self Care | Source: Ambulatory Visit | Attending: Interventional Radiology | Admitting: Interventional Radiology

## 2024-09-25 DIAGNOSIS — M1711 Unilateral primary osteoarthritis, right knee: Secondary | ICD-10-CM

## 2024-09-25 HISTORY — PX: IR EMBO ARTERIAL NOT HEMORR HEMANG INC GUIDE ROADMAPPING: IMG5448

## 2024-09-25 MED ORDER — IIOPAMIDOL (ISOVUE-250) INJECTION 51%
70.0000 mL | Freq: Once | INTRAVENOUS | Status: AC | PRN
  Administered 2024-09-25: 70 mL via INTRA_ARTERIAL

## 2024-09-25 MED ORDER — KETOROLAC TROMETHAMINE 30 MG/ML IJ SOLN
30.0000 mg | Freq: Once | INTRAMUSCULAR | Status: AC
  Administered 2024-09-25: 30 mg via INTRAVENOUS

## 2024-09-25 MED ORDER — SODIUM CHLORIDE 0.9 % IV SOLN: INTRAVENOUS | Status: DC

## 2024-09-25 MED ORDER — ACETAMINOPHEN 10 MG/ML IV SOLN
1000.0000 mg | Freq: Once | INTRAVENOUS | Status: AC
  Administered 2024-09-25: 1000 mg via INTRAVENOUS

## 2024-09-25 MED ORDER — DEXAMETHASONE SOD PHOSPHATE PF 10 MG/ML IJ SOLN
10.0000 mg | Freq: Once | INTRAMUSCULAR | Status: AC
  Administered 2024-09-25: 10 mg via INTRAVENOUS

## 2024-09-25 MED ORDER — MIDAZOLAM HCL (PF) 2 MG/2ML IJ SOLN
1.0000 mg | INTRAMUSCULAR | Status: DC | PRN
  Administered 2024-09-25 (×2): 1 mg via INTRAVENOUS

## 2024-09-25 MED ORDER — NITROGLYCERIN 1 MG/10 ML FOR IR/CATH LAB
100.0000 ug | INTRA_ARTERIAL | Status: DC | PRN
  Administered 2024-09-25 (×2): 100 ug via INTRA_ARTERIAL

## 2024-09-25 MED ORDER — LIDOCAINE-EPINEPHRINE 1 %-1:100000 IJ SOLN
10.0000 mL | Freq: Once | INTRAMUSCULAR | Status: AC
  Administered 2024-09-25: 10 mL via INTRADERMAL

## 2024-09-25 MED ORDER — FENTANYL CITRATE (PF) 50 MCG/ML IJ SOSY
25.0000 ug | PREFILLED_SYRINGE | INTRAMUSCULAR | Status: DC | PRN
  Administered 2024-09-25: 50 ug via INTRAVENOUS

## 2024-09-25 NOTE — Procedures (Signed)
 Interventional Radiology Procedure Note  Procedure: Right geniculate artery embolization  Findings: Please refer to procedural dictation for full description. Right posterior tibial artery 4 Fr access, manual compression.  Complications: None immediate  Estimated Blood Loss: <5 mL  Recommendations: IR will arrange 1 month outpatient follow up.   Ester Sides, MD

## 2024-09-28 ENCOUNTER — Telehealth: Payer: Self-pay

## 2024-09-28 ENCOUNTER — Other Ambulatory Visit: Payer: Self-pay | Admitting: Interventional Radiology

## 2024-09-28 DIAGNOSIS — M1711 Unilateral primary osteoarthritis, right knee: Secondary | ICD-10-CM

## 2024-09-28 NOTE — Progress Notes (Signed)
 See telephone note

## 2024-10-15 ENCOUNTER — Other Ambulatory Visit: Payer: Self-pay

## 2024-10-15 DIAGNOSIS — E1165 Type 2 diabetes mellitus with hyperglycemia: Secondary | ICD-10-CM

## 2024-10-26 NOTE — Progress Notes (Signed)
 Referring Physician(s): Dr. Joane   Chief Complaint: The patient is seen in follow up today s/p right geniculate artery embolization 09/25/24.   History of present illness: HPI from initial consultation  Erin Schultz is a 58 y.o. female with a medical history significant for CKD, DM, HTN, small bowel obstruction, acute peritonitis and obesity. 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 Her right knee has been treated with steroid injections, the Orthovisc series and [name] . She felt some relief with the [name]  injections but not enough to comfortably perform her activities of daily living. She is currently taking Mounjaro  for weight loss but until her BMI is less than 40 she is unable to qualify for knee surgery. Dr. Joane believes she has exhausted conservative management options and he has referred her to Interventional Radiology for geniculate artery embolization.  Her pain has been present for years.  Her primary complaint is inability to walk at a normal pace or stand for longer than 10-15 minutes without significant pain.  Her pain waxes and wanes, with some days worse than others.  She wears a knee brace occasionally which helps her pain marginally.  She takes tylenol  which also helps her pain.  She is doing water aerobics currently in attempts to loose weight which she enjoys but has been relatively unsuccessful with.   Womac Pain Score = 25/96 VAS Pain Score = 6/10  She was considered an ideal candidate for right geniculate artery embolization. We discussed the risks, benefits, alternatives and post-procedure expectations. She was agreeable to proceed and she underwent a technically successful right GAE 09/25/24.   She tolerated the procedure well and she presents today for follow up. She endorses several days of great pain and stiffness relief but the original symptoms have returned to baseline.  Her main complaint is stiffness.  She has started  doing some weight routine workouts and stretching.  She still takes Tylenol .  Her pain over the past 2 weeks has been a 7/10.  No problems with healing at the puncture site.  Past Medical History:  Diagnosis Date   Acute peritonitis (HCC) 12/20/2012   Acute respiratory failure with hypoxia (HCC) 12/20/2012   Chronic kidney disease    Diabetes mellitus without complication (HCC)    H/O hiatal hernia    Hypertension    Septic shock(785.52) 12/20/2012   Severe sepsis(995.92) 12/20/2012   Small bowel obstruction (HCC) 12/20/2012    Past Surgical History:  Procedure Laterality Date   ABDOMINAL HYSTERECTOMY     APPENDECTOMY     CHOLECYSTECTOMY     IR EMBO ARTERIAL NOT HEMORR HEMANG INC GUIDE ROADMAPPING  09/25/2024   IR RADIOLOGIST EVAL & MGMT  08/28/2024   LAPAROTOMY  12/15/2012   Procedure: EXPLORATORY LAPAROTOMY;  Surgeon: Jina Nephew, MD;  Location: [facility];  Service: General;  Laterality: N/A;   LAPAROTOMY N/A 12/19/2012   Procedure: EXPLORATORY LAPAROTOMY;  Surgeon: Elon CHRISTELLA Pacini, MD;  Location: [facility];  Service: General;  Laterality: N/A;   VACUUM ASSISTED CLOSURE CHANGE  12/17/2012   Procedure: ABDOMINAL VACUUM ASSISTED CLOSURE CHANGE;  Surgeon: Elon CHRISTELLA Pacini, MD;  Location: [facility];  Service: General;  Laterality: N/A;  EX Lap with vac change   VACUUM ASSISTED CLOSURE CHANGE N/A 12/19/2012   Procedure: ABDOMINAL VACUUM ASSISTED CLOSURE CHANGE;  Surgeon: Elon CHRISTELLA Pacini, MD;  Location: [facility];  Service: General;  Laterality: N/A;   VACUUM ASSISTED CLOSURE CHANGE N/A 12/22/2012   Procedure: ABDOMINAL  VACUUM ASSISTED CLOSURE CHANGE;  Surgeon: Alm VEAR Angle, MD;  Location: [facility] OR;  Service: General;  Laterality: N/A;   VACUUM ASSISTED CLOSURE CHANGE N/A 12/24/2012   Procedure: ABDOMINAL VACUUM ASSISTED CLOSURE CHANGE;  Surgeon: Alm VEAR Angle, MD;  Location: [facility];  Service: General;  Laterality: N/A;    Allergies: Sulfa antibiotics  Medications: Prior to Admission medications  Medication Sig  Start Date End Date Taking? Authorizing Provider  atorvastatin  (LIPITOR) 40 MG tablet Take 1 tablet (40 mg total) by mouth daily. 01/09/24   McDiarmid, Krystal BIRCH, MD  Blood Glucose Monitoring Suppl (ONE TOUCH ULTRA 2) w/Device KIT USE AS DIRECTED 11/21/19   Delane Lye, MD  cetirizine  (ZYRTEC ) 10 MG tablet Take 1 tablet (10 mg total) by mouth daily. 01/09/24   McDiarmid, Krystal BIRCH, MD  JARDIANCE  25 MG TABS tablet TAKE 1 TABLET (25 MG TOTAL) BY MOUTH DAILY. 07/31/24   Manon Jester, DO  losartan  (COZAAR ) 100 MG tablet Take 1 tablet (100 mg total) by mouth daily. 07/31/24   Manon Jester, DO  Magnesium 100 MG CAPS Take 1 capsule by mouth daily at 6 (six) AM.    [provider]  metFORMIN  (GLUCOPHAGE ) 1000 MG tablet TAKE 1 TABLET (1,000 MG TOTAL) BY MOUTH TWICE A DAY WITH FOOD 10/16/24   Manon Jester, DO  methylPREDNISolone  (MEDROL  DOSEPAK) 4 MG TBPK tablet Take as prescribed by pharmacy 09/25/24   Jennefer Ester PARAS, MD  Multiple Vitamins-Minerals (MULTI-VITAMIN GUMMIES) CHEW Chew by mouth.    [provider]  [facility] DELICA LANCETS FINE MISC 1 application by Does not apply route 3 (three) times daily. 01/03/17   McKeag, Chauncey BIRCH, MD  ONETOUCH ULTRA test strip USE THREE TIMES DAILY BEFORE BREAKFAST AND AFTER MEALS. 03/26/22   Norbert, John C, MD  polyethylene glycol (MIRALAX / GLYCOLAX) 17 g packet Take 17 g by mouth daily.    [provider]  pravastatin  (PRAVACHOL ) 40 MG tablet TAKE 1 TABLET BY MOUTH EVERYDAY AT BEDTIME 08/26/24   Manon Jester, DO  tirzepatide  (MOUNJARO ) 15 MG/0.5ML Pen Inject 15 mg into the skin once a week. 06/02/24   Manon Jester, DO  UNIFINE PENTIPS PLUS 31G X 6 MM MISC USE AS DIRECTED 01/12/19   Ladell Alm, DO  OZEMPIC , 1 MG/DOSE, 4 MG/3ML SOPN INJECT 2 MG INTO THE SKIN ONCE A WEEK. 05/28/22   Rosendo Norleen BROCKS, MD     Family History  Problem Relation Age of Onset   Diabetes Father    Hypertension Father    Diabetes Brother    Colon polyps Neg Hx     Colon cancer Neg Hx    Breast cancer Neg Hx     Social History   Socioeconomic History   Marital status: Single    Spouse name: Not on file   Number of children: Not on file   Years of education: Not on file   Highest education level: Not on file  Occupational History   Not on file  Tobacco Use   Smoking status: Never   Smokeless tobacco: Never  Substance and Sexual Activity   Alcohol use: No   Drug use: No   Sexual activity: Not on file  Other Topics Concern   Not on file  Social History Narrative   Not on file   Social Drivers of Health   Tobacco Use: Low Risk (05/29/2024)   Patient History    Smoking Tobacco Use: Never    Smokeless Tobacco Use: Never  Passive Exposure: Not on file  Financial Resource Strain: Not on file  Food Insecurity: Not on file  Transportation Needs: Not on file  Physical Activity: Not on file  Stress: Not on file  Social Connections: Not on file  Depression (PHQ2-9): Low Risk (07/05/2023)   Depression (PHQ2-9)    PHQ-2 Score: 0  Alcohol Screen: Not on file  Housing: Not on file  Utilities: Not on file  Health Literacy: Not on file     Vital Signs: There were no vitals taken for this visit.  Physical Exam Constitutional:      General: She is not in acute distress. HENT:     Head: Normocephalic.     Mouth/Throat:     Mouth: Mucous membranes are moist.  Eyes:     General: No scleral icterus. Cardiovascular:     Rate and Rhythm: Normal rate and regular rhythm.  Pulmonary:     Effort: No respiratory distress.  Abdominal:     General: There is no distension.  Musculoskeletal:     Right lower leg: No edema.     Left lower leg: No edema.  Skin:    General: Skin is warm and dry.  Neurological:     Mental Status: She is alert and oriented to person, place, and time.     Imaging:   Right Knee X-ray 02/14/23    Kellgren and Jerilynn Grade III  Right GAE 09/25/24       Labs:  CBC: No results for input(s):  WBC, HGB, HCT, PLT in the last 8760 hours.  COAGS: No results for input(s): INR, APTT in the last 8760 hours.  BMP: Recent Labs    05/29/24 1616  NA 140  K 4.2  CL 103  CO2 21  GLUCOSE 149*  BUN 24  CALCIUM  10.4*  CREATININE 1.24*    LIVER FUNCTION TESTS: No results for input(s): BILITOT, AST, ALT, ALKPHOS, PROT, ALBUMIN  in the last 8760 hours.  Assessment and Plan: 58 year old female with a history of chronic right knee pain (WOMAC 25/96) secondary to advanced osteoarthritis (K&G 3) that had been unresponsive to conservative measures. She underwent a technically successful right geniculate artery embolization 09/25/24 treating the articular branch of of the descending and inferiorlateral geniculate arteries.  She unfortunately has not had significant relief of her stiffness or pain.  She is continuing conservative measures.  We discussed that it may take up to 3 months to get response from GAE.    Plan to follow up in 2 months in IR clinic.  Ester Sides, MD Pager: [phone]   I spent a total of 25 Minutes in face to face in clinical consultation, greater than 50% of which was counseling/coordinating care for right knee pain.

## 2024-10-28 ENCOUNTER — Inpatient Hospital Stay
Admission: RE | Admit: 2024-10-28 | Discharge: 2024-10-28 | Disposition: A | Source: Ambulatory Visit | Attending: Interventional Radiology

## 2024-10-28 DIAGNOSIS — M1711 Unilateral primary osteoarthritis, right knee: Secondary | ICD-10-CM

## 2024-10-28 HISTORY — PX: IR RADIOLOGIST EVAL & MGMT: IMG5224

## 2024-11-10 ENCOUNTER — Other Ambulatory Visit: Payer: Self-pay

## 2024-11-11 ENCOUNTER — Telehealth: Payer: Self-pay | Admitting: Family Medicine

## 2024-11-11 NOTE — Telephone Encounter (Signed)
 Pt has GAE on 10/28/24.   Last R knee steroid injection 05/14/24.  Forwarding to Dr. Joane to review and advise.

## 2024-11-11 NOTE — Telephone Encounter (Signed)
 Patient called stating that she had the GAE procedure but it was not successful. She would like to know if she could have another cortisone injection or what next steps would be?  Please advise.

## 2024-11-13 NOTE — Telephone Encounter (Signed)
 Please reach out to assist with scheduling appt with Dr. Joane. Thank you!

## 2024-11-13 NOTE — Telephone Encounter (Signed)
 Okay to schedule an injection with me.

## 2024-11-13 NOTE — Telephone Encounter (Signed)
 Scheduled

## 2024-11-17 ENCOUNTER — Telehealth: Payer: Self-pay

## 2024-11-17 NOTE — Telephone Encounter (Signed)
 Re-auth [name] , R knee

## 2024-11-17 NOTE — Telephone Encounter (Signed)
 [name]  benefits ran for right knee case ID [id number]

## 2024-11-17 NOTE — Telephone Encounter (Signed)
 Done and new note created for this year

## 2024-11-18 NOTE — Telephone Encounter (Signed)
 Ran patient through [employer] states no pre cert required, but I know that this is not always correct. Checked patients cost estimator and it showed they cost is $0. I have also done a pre certification through covermymeds to make sure everything is correct.   Erin Schultz) - [phone]

## 2024-11-20 NOTE — Telephone Encounter (Signed)
 Scheduled 11/23/24  [name]  authorized for right knee PRE CERT REQUIRED Patient responsible for 20% coinsurance Copay $80 Deductible does not apply OOP MAX $5000 has met $165 AUTHORIZATION # [id number] 11/18/24-11/18/25

## 2024-11-20 NOTE — Progress Notes (Unsigned)
"       ° °  LILLETTE Ileana Collet, PhD, LAT, ATC acting as a scribe for Artist Lloyd, MD.  Erin Schultz is a 59 y.o. female who presents to [facility] today for cont'd R knee pain. Pt was last seen by Dr. Lloyd on 05/14/24 and she was given a R knee steroid injection. Pt was also referred to bariatric surgery and interventional radiology for GAE consult. Genicular artery embolization was performed on October 28, 2024  Completed the Orthovisc series, 3/3, on 01/17/24. GAE 10/28/24 [name]  injection 02/11/24  Today, pt reports only a few days of relief from GAE. R knee pain continues. She notes stiffness and pain w/ transitioning to stand  Dx imaging: 02/14/23 R knee XR  Pertinent review of systems: No fevers or chills  Relevant historical information: Diabetes and right knee arthritis   Exam:  BP 134/84   Pulse 79   Ht 5' 5.5 (1.664 m)   Wt 278 lb (126.1 kg)   SpO2 98%   BMI 45.56 kg/m  General: Well Developed, well nourished, and in no acute distress.   MSK: Right knee mild effusion normal motion antalgic gait.    Lab and Radiology Results   [name]  injection right knee Procedure: Real-time Ultrasound Guided Injection of right knee joint superior lateral patellar space Device: Philips Affiniti 50G Images permanently stored and available for review in PACS Verbal informed consent obtained.  Discussed risks and benefits of procedure. Warned about infection, hyperglycemia bleeding, damage to structures among others. Patient expresses understanding and agreement Time-out conducted.   Noted no overlying erythema, induration, or other signs of local infection.   Skin prepped in a sterile fashion.   Local anesthesia: Topical Ethyl chloride.   With sterile technique and under real time ultrasound guidance: [name]  32 mg injected into knee joint. Fluid seen entering the joint capsule.   Completed without difficulty   Advised to call if fevers/chills, erythema,  induration, drainage, or persistent bleeding.   Images permanently stored and available for review in the ultrasound unit.  Impression: Technically successful ultrasound guided injection.  Lot number: [id number]     Assessment and Plan: 59 y.o. female with right knee pain due to exacerbation of DJD.  Plan for [name]  injection.  We can repeat this every 3 months.  Hopefully the genicular artery embolization will provide benefit in the next few months.   PDMP not reviewed this encounter. Orders Placed This Encounter  Procedures   US  LIMITED JOINT SPACE STRUCTURES LOW RIGHT(NO LINKED CHARGES)    Reason for Exam (SYMPTOM  OR DIAGNOSIS REQUIRED):   right knee OA    Preferred imaging location?:   [facility] ordered this encounter  Medications   Triamcinolone  Acetonide ([name] ) intra-articular injection 32 mg     Discussed warning signs or symptoms. Please see discharge instructions. Patient expresses understanding.   The above documentation has been reviewed and is accurate and complete Artist Lloyd, M.D.   "

## 2024-11-23 ENCOUNTER — Ambulatory Visit: Admitting: Family Medicine

## 2024-11-23 ENCOUNTER — Other Ambulatory Visit: Payer: Self-pay

## 2024-11-23 VITALS — BP 134/84 | HR 79 | Ht 65.5 in | Wt 278.0 lb

## 2024-11-23 DIAGNOSIS — M25561 Pain in right knee: Secondary | ICD-10-CM | POA: Diagnosis not present

## 2024-11-23 DIAGNOSIS — G8929 Other chronic pain: Secondary | ICD-10-CM | POA: Diagnosis not present

## 2024-11-23 DIAGNOSIS — M1711 Unilateral primary osteoarthritis, right knee: Secondary | ICD-10-CM

## 2024-11-23 MED ORDER — TRIAMCINOLONE ACETONIDE 32 MG IX SRER
32.0000 mg | Freq: Once | INTRA_ARTICULAR | Status: AC
  Administered 2024-11-23: 32 mg via INTRA_ARTICULAR

## 2024-11-23 NOTE — Telephone Encounter (Signed)
 Noted

## 2024-11-23 NOTE — Patient Instructions (Addendum)
 Thank you for coming in today.   You received an injection today. Seek immediate medical attention if the joint becomes red, extremely painful, or is oozing fluid.   We can repeat the [name]  in 23-months, if needed  Check back as needed

## 2024-12-04 ENCOUNTER — Other Ambulatory Visit: Payer: Self-pay

## 2024-12-07 MED ORDER — ONETOUCH ULTRA VI STRP: ORAL_STRIP | 2 refills | Status: AC
